# Patient Record
Sex: Male | Born: 1950
Health system: Southern US, Community
[De-identification: ages and names within clinical notes are randomized; demographics above are authoritative.]

## PROBLEM LIST (undated history)

## (undated) DIAGNOSIS — I480 Paroxysmal atrial fibrillation: Secondary | ICD-10-CM

## (undated) DIAGNOSIS — R0681 Apnea, not elsewhere classified: Secondary | ICD-10-CM

## (undated) DIAGNOSIS — G709 Myoneural disorder, unspecified: Secondary | ICD-10-CM

## (undated) DIAGNOSIS — I471 Supraventricular tachycardia, unspecified: Secondary | ICD-10-CM

## (undated) DIAGNOSIS — I10 Essential (primary) hypertension: Secondary | ICD-10-CM

## (undated) DIAGNOSIS — M1990 Unspecified osteoarthritis, unspecified site: Secondary | ICD-10-CM

## (undated) DIAGNOSIS — E119 Type 2 diabetes mellitus without complications: Secondary | ICD-10-CM

## (undated) DIAGNOSIS — E7800 Pure hypercholesterolemia, unspecified: Secondary | ICD-10-CM

## (undated) HISTORY — DX: Apnea, not elsewhere classified: R06.81

## (undated) HISTORY — DX: Myoneural disorder, unspecified: G70.9

## (undated) HISTORY — DX: Supraventricular tachycardia: I47.1

## (undated) HISTORY — DX: Unspecified osteoarthritis, unspecified site: M19.90

## (undated) HISTORY — DX: Supraventricular tachycardia, unspecified: I47.10

## (undated) HISTORY — PX: EYE SURGERY: SHX253

## (undated) HISTORY — DX: Paroxysmal atrial fibrillation: I48.0

---
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
## 2000-06-24 ENCOUNTER — Emergency Department (HOSPITAL_COMMUNITY): Admission: EM | Admit: 2000-06-24 | Discharge: 2000-06-24 | Payer: Self-pay | Admitting: Emergency Medicine

## 2000-06-24 ENCOUNTER — Encounter: Payer: Self-pay | Admitting: Internal Medicine

## 2000-07-06 ENCOUNTER — Emergency Department (HOSPITAL_COMMUNITY): Admission: EM | Admit: 2000-07-06 | Discharge: 2000-07-06 | Payer: Self-pay | Admitting: Internal Medicine

## 2004-08-11 ENCOUNTER — Encounter: Admission: RE | Admit: 2004-08-11 | Discharge: 2004-11-09 | Payer: Self-pay | Admitting: Family Medicine

## 2005-04-07 ENCOUNTER — Ambulatory Visit (HOSPITAL_BASED_OUTPATIENT_CLINIC_OR_DEPARTMENT_OTHER): Admission: RE | Admit: 2005-04-07 | Discharge: 2005-04-07 | Payer: Self-pay | Admitting: *Deleted

## 2005-04-19 ENCOUNTER — Ambulatory Visit: Payer: Self-pay | Admitting: Internal Medicine

## 2005-05-29 ENCOUNTER — Encounter: Admission: RE | Admit: 2005-05-29 | Discharge: 2005-05-29 | Payer: Self-pay | Admitting: Family Medicine

## 2008-08-24 HISTORY — PX: CARPAL TUNNEL RELEASE: SHX101

## 2012-07-12 ENCOUNTER — Other Ambulatory Visit: Payer: Self-pay | Admitting: Family Medicine

## 2012-07-12 DIAGNOSIS — M545 Low back pain, unspecified: Secondary | ICD-10-CM

## 2012-07-19 ENCOUNTER — Other Ambulatory Visit: Payer: Self-pay | Admitting: Family Medicine

## 2012-07-19 DIAGNOSIS — Z139 Encounter for screening, unspecified: Secondary | ICD-10-CM

## 2012-07-22 ENCOUNTER — Emergency Department (INDEPENDENT_AMBULATORY_CARE_PROVIDER_SITE_OTHER)
Admission: EM | Admit: 2012-07-22 | Discharge: 2012-07-22 | Disposition: A | Discharge status: Nursing Home (Includes ICF) | Payer: BC Managed Care – PPO | Source: Home / Self Care | Attending: Emergency Medicine | Admitting: Emergency Medicine

## 2012-07-22 ENCOUNTER — Emergency Department (HOSPITAL_COMMUNITY)
Admission: EM | Admit: 2012-07-22 | Discharge: 2012-07-22 | Disposition: A | Discharge status: Home / Self Care | Payer: BC Managed Care – PPO | Attending: Emergency Medicine | Admitting: Emergency Medicine

## 2012-07-22 ENCOUNTER — Encounter (HOSPITAL_COMMUNITY): Payer: Self-pay | Admitting: Emergency Medicine

## 2012-07-22 ENCOUNTER — Ambulatory Visit
Admission: RE | Admit: 2012-07-22 | Discharge: 2012-07-22 | Disposition: A | Discharge status: Home / Self Care | Payer: BC Managed Care – PPO | Source: Ambulatory Visit | Attending: Family Medicine | Admitting: Family Medicine

## 2012-07-22 ENCOUNTER — Encounter (HOSPITAL_COMMUNITY): Payer: Self-pay | Admitting: *Deleted

## 2012-07-22 ENCOUNTER — Emergency Department (HOSPITAL_COMMUNITY): Payer: BC Managed Care – PPO

## 2012-07-22 DIAGNOSIS — E7800 Pure hypercholesterolemia, unspecified: Secondary | ICD-10-CM | POA: Insufficient documentation

## 2012-07-22 DIAGNOSIS — I10 Essential (primary) hypertension: Secondary | ICD-10-CM | POA: Insufficient documentation

## 2012-07-22 DIAGNOSIS — R0789 Other chest pain: Secondary | ICD-10-CM

## 2012-07-22 DIAGNOSIS — R079 Chest pain, unspecified: Secondary | ICD-10-CM

## 2012-07-22 DIAGNOSIS — Z139 Encounter for screening, unspecified: Secondary | ICD-10-CM

## 2012-07-22 DIAGNOSIS — E119 Type 2 diabetes mellitus without complications: Secondary | ICD-10-CM | POA: Insufficient documentation

## 2012-07-22 DIAGNOSIS — Z79899 Other long term (current) drug therapy: Secondary | ICD-10-CM | POA: Insufficient documentation

## 2012-07-22 DIAGNOSIS — M545 Low back pain, unspecified: Secondary | ICD-10-CM

## 2012-07-22 DIAGNOSIS — Z794 Long term (current) use of insulin: Secondary | ICD-10-CM | POA: Insufficient documentation

## 2012-07-22 HISTORY — DX: Essential (primary) hypertension: I10

## 2012-07-22 HISTORY — DX: Pure hypercholesterolemia, unspecified: E78.00

## 2012-07-22 HISTORY — DX: Type 2 diabetes mellitus without complications: E11.9

## 2012-07-22 LAB — CBC WITH DIFFERENTIAL/PLATELET
Basophils Absolute: 0.1 10*3/uL (ref 0.0–0.1)
Eosinophils Relative: 3 % (ref 0–5)
HCT: 41 % (ref 39.0–52.0)
Lymphocytes Relative: 20 % (ref 12–46)
Lymphs Abs: 1.9 10*3/uL (ref 0.7–4.0)
MCV: 87.8 fL (ref 78.0–100.0)
Monocytes Absolute: 1 10*3/uL (ref 0.1–1.0)
Monocytes Relative: 10 % (ref 3–12)
RDW: 13.2 % (ref 11.5–15.5)
WBC: 9.4 10*3/uL (ref 4.0–10.5)

## 2012-07-22 LAB — COMPREHENSIVE METABOLIC PANEL
BUN: 16 mg/dL (ref 6–23)
CO2: 27 mEq/L (ref 19–32)
Calcium: 9.6 mg/dL (ref 8.4–10.5)
Creatinine, Ser: 0.86 mg/dL (ref 0.50–1.35)
GFR calc Af Amer: >90 mL/min (ref >90)
GFR calc non Af Amer: >90 mL/min (ref >90)
Glucose, Bld: 146 mg/dL — ABNORMAL HIGH (ref 70–99)

## 2012-07-22 LAB — TROPONIN I: Troponin I: <0.3 ng/mL (ref <0.30)

## 2012-07-22 MED ORDER — KETOROLAC TROMETHAMINE 30 MG/ML IJ SOLN
30.0000 mg | Freq: Once | INTRAMUSCULAR | Status: AC
  Administered 2012-07-22: 30 mg via INTRAVENOUS
  Filled 2012-07-22: qty 1

## 2012-07-22 NOTE — ED Notes (Signed)
Pt  Reports       l  Sided     Chest  Pain       X   1  Week     Constant   Pain          With  The      Degree   Varying  Actually  Worse  yest         Now  It is  A  4          Pt  Reports      Pain  Worse  l  Side   When  He  Takes  Deep  Breath

## 2012-07-22 NOTE — ED Notes (Signed)
Pt states yesterday began having left axilla pain while outside working, radiating to left neck and shoulder. Relieved with rest. Pt continues to c/o dull pain in left axilla area. 20g left hand, 2L [place], CTA. Pt rates pain 3/10. Reproducible with position or movement.

## 2012-07-22 NOTE — ED Provider Notes (Signed)
History     CSN: [id number]  Arrival date & time 07/22/12  1254   First MD Initiated Contact with Patient 07/22/12 1301      Chief Complaint  Patient presents with  . Chest Pain    (Consider location/radiation/quality/duration/timing/severity/associated sxs/prior treatment) Patient is a 61 y.o. male presenting with chest pain. The history is provided by the patient and the spouse.  Chest Pain The chest pain began 5 - 7 days ago. Chest pain occurs intermittently. The chest pain is unchanged. The pain is associated with breathing. The severity of the pain is mild. The quality of the pain is described as dull. The pain radiates to the left neck. Chest pain is worsened by deep breathing. Pertinent negatives for primary symptoms include no fever, no fatigue, no cough, no nausea and no vomiting. Risk factors include male gender and obesity.  His past medical history is significant for diabetes, hyperlipidemia and hypertension.     Past Medical History  Diagnosis Date  . Diabetes mellitus without complication   . Hypertension   . Hypercholesterolemia     Past Surgical History  Procedure Date  . Eye surgery     No family history on file.  History  Substance Use Topics  . Smoking status: Never Smoker   . Smokeless tobacco: Not on file  . Alcohol Use: No      Review of Systems  Constitutional: Negative for fever and fatigue.  Respiratory: Negative for cough.   Cardiovascular: Positive for chest pain.  Gastrointestinal: Negative for nausea and vomiting.    Allergies  Review of patient's allergies indicates no known allergies.  Home Medications   Current Outpatient Rx  Name  Route  Sig  Dispense  Refill  . INSULIN GLARGINE 100 UNIT/ML [place] SOLN   Subcutaneous   Inject 50 Units into the skin 2 (two) times daily.          . ASPIRIN EC 81 MG PO TBEC   Oral   Take 162 mg by mouth at bedtime.         Marland Kitchen EZETIMIBE 10 MG PO TABS   Oral   Take 10 mg by mouth  daily.         . INSULIN LISPRO (HUMAN) 100 UNIT/ML [place] SOLN   Subcutaneous   Inject 20-50 Units into the skin 3 (three) times daily before meals. Take as directed per sliding scale         . LISINOPRIL 20 MG PO TABS   Oral   Take 20 mg by mouth daily.         . MELOXICAM 7.5 MG PO TABS   Oral   Take 7.5 mg by mouth daily as needed. For pain/inflammation         . METFORMIN HCL 500 MG PO TABS   Oral   Take 1,000 mg by mouth 2 (two) times daily with a meal.         . OMEGA-3-ACID ETHYL ESTERS 1 G PO CAPS   Oral   Take 1 g by mouth 2 (two) times daily.         Marland Kitchen PREGABALIN 75 MG PO CAPS   Oral   Take 75 mg by mouth 2 (two) times daily.         Marland Kitchen ROSUVASTATIN CALCIUM 40 MG PO TABS   Oral   Take 20 mg by mouth at bedtime.         Marland Kitchen SITAGLIPTIN PHOSPHATE 100 MG PO TABS  Oral   Take 100 mg by mouth daily.           BP 169/73  Pulse 76  Temp 98.8 F (37.1 C) (Oral)  Resp 18  SpO2 97%  Physical Exam  Nursing note and vitals reviewed. Constitutional: He is oriented to person, place, and time. He appears well-developed and well-nourished.  Neck: Normal range of motion. Neck supple.  Cardiovascular: Normal rate, regular rhythm, normal heart sounds and intact distal pulses.   Pulmonary/Chest: Effort normal and breath sounds normal. He exhibits no tenderness.  Abdominal: Soft. Bowel sounds are normal. There is no tenderness.  Neurological: He is alert and oriented to person, place, and time.  Skin: Skin is warm and dry.    ED Course  Procedures (including critical care time)  Labs Reviewed - No data to display No results found.   1. Atypical chest pain       MDM     Linna Hoff, MD 07/26/12 1745

## 2012-07-22 NOTE — ED Notes (Signed)
Pt placed on 2L O2 ; heart monitor; rate 73 bpm

## 2012-07-22 NOTE — ED Provider Notes (Signed)
History     CSN: [id number]  Arrival date & time 07/22/12  1408   First MD Initiated Contact with Patient 07/22/12 1415      Chief Complaint  Patient presents with  . Chest Pain    (Consider location/radiation/quality/duration/timing/severity/associated sxs/prior treatment) HPI Comments: Patient with pmh DM, HTN, Cholesterol.  Presents with complaints of pain in the left side of his chest under his axilla for the past 7-8 days.  He says the symptoms started after cleaning up the yard.  He denies injury or trauma.  There is no exertional component but it is worse with deep breath and cough.  He has no prior cardiac history.  Patient is a 61 y.o. male presenting with chest pain. The history is provided by the patient.  Chest Pain Episode onset: one week ago. Chest pain occurs intermittently. The chest pain is unchanged. The pain is associated with breathing. The severity of the pain is moderate. The quality of the pain is described as sharp. The pain does not radiate. Chest pain is worsened by deep breathing and certain positions. Pertinent negatives for primary symptoms include no fever and no shortness of breath.     Past Medical History  Diagnosis Date  . Diabetes mellitus without complication   . Hypertension   . Hypercholesterolemia     Past Surgical History  Procedure Date  . Eye surgery     No family history on file.  History  Substance Use Topics  . Smoking status: Never Smoker   . Smokeless tobacco: Not on file  . Alcohol Use: No      Review of Systems  Constitutional: Negative for fever.  Respiratory: Negative for shortness of breath.   Cardiovascular: Positive for chest pain.  All other systems reviewed and are negative.    Allergies  Review of patient's allergies indicates no known allergies.  Home Medications   Current Outpatient Rx  Name  Route  Sig  Dispense  Refill  . INSULIN GLARGINE 100 UNIT/ML [place] SOLN   Subcutaneous   Inject into the  skin at bedtime.         Marland Kitchen HUMALOG [place]   Subcutaneous   Inject into the skin.         Marland Kitchen LISINOPRIL PO   Oral   Take by mouth.         Marland Kitchen LYRICA PO   Oral   Take by mouth.         . ROSUVASTATIN CALCIUM 20 MG PO TABS   Oral   Take 20 mg by mouth daily.         Marland Kitchen JANUVIA PO   Oral   Take by mouth.           There were no vitals taken for this visit.  Physical Exam  Nursing note and vitals reviewed. Constitutional: He is oriented to person, place, and time. He appears well-developed and well-nourished. No distress.  HENT:  Head: Normocephalic and atraumatic.  Mouth/Throat: Oropharynx is clear and moist.  Neck: Normal range of motion. Neck supple.  Cardiovascular: Normal rate and regular rhythm.   No murmur heard. Pulmonary/Chest: Effort normal and breath sounds normal. No respiratory distress. He has no wheezes.  Abdominal: Soft. Bowel sounds are normal. He exhibits no distension. There is no tenderness.  Musculoskeletal: Normal range of motion. He exhibits no edema.  Neurological: He is alert and oriented to person, place, and time.  Skin: Skin is warm and dry. He is not diaphoretic.  ED Course  Procedures (including critical care time)   Labs Reviewed  CBC WITH DIFFERENTIAL  COMPREHENSIVE METABOLIC PANEL  TROPONIN I   Dg Eye Foreign Body  07/22/2012  *RADIOLOGY REPORT*  Clinical Data: Foreign body screening for MRI.  ORBITS FOR FOREIGN BODY - 2 VIEW  Comparison: None.  Findings: No radiopaque orbital foreign bodies.  Skull appears within normal limits.  IMPRESSION: Negative.   Original Report Authenticated By: Andreas Newport, M.D.    Mr Lumbar Spine Wo Contrast  07/22/2012  *RADIOLOGY REPORT*  Clinical Data: Low back pain with right buttock pain.  Right leg pain and numbness.  MRI LUMBAR SPINE WITHOUT CONTRAST  Technique:  Multiplanar and multiecho pulse sequences of the lumbar spine were obtained without intravenous contrast.  Comparison: None.   Findings: Normal lumbar alignment.  Negative for fracture or mass. Conus medullaris is normal and terminates at mid L2.  L1-2:  Mild disc and facet degeneration.  L2-3:  Mild disc bulging and mild facet degeneration causing mild spinal stenosis.  No focal disc protrusion.  L3-4:  Mild disc bulging and mild facet degeneration without significant spinal stenosis.  L4-5:  Disc degeneration with disc bulging and mild spondylosis. Small left foraminal disc protrusion is present extending lateral to the foramen.  This could cause irritation of the left L4 or L5 nerve roots.  There is mild foraminal narrowing bilaterally due to spurring.  L5-S1:  Disc degeneration and spondylosis.  Schmorl's node in the inferior plate of L5 on the left.  There is marked facet hypertrophy on the left causing left foraminal encroachment.  There is also narrowing of the lateral recess.  Possible impingement of the left L5 or S1 nerve roots.  No acute disc protrusion.  IMPRESSION: Lumbar degenerative changes, most prominent on the left at L4-5 and L5-S1.   Original Report Authenticated By: Janeece Riggers, M.D.      No diagnosis found.   Date: 07/22/2012  Rate: 73  Rhythm: normal sinus rhythm  QRS Axis: normal  Intervals: normal  ST/T Wave abnormalities: normal  Conduction Disutrbances:none  Narrative Interpretation:   Old EKG Reviewed: unchanged    MDM  The patient presents here with chest pain that is atypical for cardiac pain.  It has been going on for about 8 days and the workup is negative.  I highly doubt a cardiac etiology.  He does have significant risk factors, but had a negative stress test about 1 1/2 years ago.  I have discussed the results with the patient who would prefer to be discharged.  I am okay with this as he has an appointment with his cardiologist on Monday.  I have advised him not to partake in any strenuous activity until seen by his pcp.  To return if he worsens.       Geoffery Lyons, MD 07/22/12  [phone]

## 2012-07-22 NOTE — ED Notes (Signed)
Placed  On  Cardiac  Monitor  Nasal  o2    At  2  l   Ns  tko  Via  20  Angio   l  Hand   1  Att

## 2012-09-15 ENCOUNTER — Telehealth: Payer: Self-pay | Admitting: Gastroenterology

## 2012-09-16 ENCOUNTER — Encounter: Payer: Self-pay | Admitting: Gastroenterology

## 2012-09-16 NOTE — Telephone Encounter (Signed)
Med Rec was able to find the Path and Polyp was Hyperplastic so his recall would be 10 years. lmom for pt or wife to call back.

## 2012-09-16 NOTE — Telephone Encounter (Signed)
Informed wife that I can only find the COLON from 08/03/2003 in [place] and I can't find the Path report. A Recall is in for a COLON in December, 2014. The old COLON stated COLONs should be repeated Q3 years. I have sent for the chart. Wife stated understanding.

## 2012-09-19 NOTE — Telephone Encounter (Signed)
Informed wife that pt is due in December, 2014 for his COLON and about his path report. Wife reports pt received a letter from his insurance company informing him he was due, so we will schedule him for March, 2014.

## 2012-11-07 ENCOUNTER — Encounter: Payer: BC Managed Care – PPO | Admitting: Gastroenterology

## 2012-11-17 ENCOUNTER — Telehealth: Payer: Self-pay | Admitting: Family Medicine

## 2012-11-17 DIAGNOSIS — E119 Type 2 diabetes mellitus without complications: Secondary | ICD-10-CM

## 2012-11-17 DIAGNOSIS — E785 Hyperlipidemia, unspecified: Secondary | ICD-10-CM

## 2012-11-17 MED ORDER — ROSUVASTATIN CALCIUM 40 MG PO TABS: 20.0000 mg | ORAL_TABLET | Freq: Every day | ORAL | Status: DC

## 2012-11-17 MED ORDER — INSULIN GLARGINE 100 UNIT/ML ~~LOC~~ SOLN: 50.0000 [IU] | Freq: Two times a day (BID) | SUBCUTANEOUS | Status: DC

## 2012-11-17 MED ORDER — EZETIMIBE 10 MG PO TABS: 10.0000 mg | ORAL_TABLET | Freq: Every day | ORAL | Status: DC

## 2012-11-17 MED ORDER — OMEGA-3-ACID ETHYL ESTERS 1 G PO CAPS: 2.0000 g | ORAL_CAPSULE | Freq: Two times a day (BID) | ORAL | Status: DC

## 2012-11-17 MED ORDER — SITAGLIPTIN PHOSPHATE 100 MG PO TABS: 100.0000 mg | ORAL_TABLET | Freq: Every day | ORAL | Status: DC

## 2012-11-17 NOTE — Telephone Encounter (Signed)
Medication refilled per protocol. 

## 2012-11-21 ENCOUNTER — Encounter: Payer: Self-pay | Admitting: Family Medicine

## 2012-11-25 ENCOUNTER — Encounter: Payer: Self-pay | Admitting: *Deleted

## 2012-11-25 ENCOUNTER — Ambulatory Visit (AMBULATORY_SURGERY_CENTER): Payer: BC Managed Care – PPO | Admitting: *Deleted

## 2012-11-25 VITALS — Ht 73.0 in | Wt 300.0 lb

## 2012-11-25 DIAGNOSIS — Z1211 Encounter for screening for malignant neoplasm of colon: Secondary | ICD-10-CM

## 2012-11-25 MED ORDER — MOVIPREP 100 G PO SOLR: ORAL | Status: DC

## 2012-12-02 ENCOUNTER — Encounter: Payer: Self-pay | Admitting: Gastroenterology

## 2012-12-02 ENCOUNTER — Ambulatory Visit (AMBULATORY_SURGERY_CENTER): Payer: BC Managed Care – PPO | Admitting: Gastroenterology

## 2012-12-02 ENCOUNTER — Other Ambulatory Visit: Payer: Self-pay | Admitting: Gastroenterology

## 2012-12-02 VITALS — BP 151/91 | HR 56 | Temp 98.1°F | Resp 20 | Ht 72.0 in | Wt 300.0 lb

## 2012-12-02 DIAGNOSIS — D126 Benign neoplasm of colon, unspecified: Secondary | ICD-10-CM

## 2012-12-02 DIAGNOSIS — Z1211 Encounter for screening for malignant neoplasm of colon: Secondary | ICD-10-CM

## 2012-12-02 DIAGNOSIS — K5730 Diverticulosis of large intestine without perforation or abscess without bleeding: Secondary | ICD-10-CM

## 2012-12-02 LAB — GLUCOSE, CAPILLARY: Glucose-Capillary: 112 mg/dL — ABNORMAL HIGH (ref 70–99)

## 2012-12-02 MED ORDER — SODIUM CHLORIDE 0.9 % IV SOLN: 500.0000 mL | INTRAVENOUS | Status: DC

## 2012-12-02 NOTE — Patient Instructions (Addendum)

## 2012-12-02 NOTE — Progress Notes (Signed)
Report to pacu rn, vss, bbs=clear 

## 2012-12-02 NOTE — Progress Notes (Signed)
Called to room to assist during endoscopic procedure.  Patient ID and intended procedure confirmed with present staff. Received instructions for my participation in the procedure from the performing physician.  

## 2012-12-02 NOTE — Op Note (Signed)
[facility] [address] [place], [address]   COLONOSCOPY PROCEDURE REPORT  PATIENT: [name], [name]  MR#: [id number] BIRTHDATE: 11-19-1950 , 61  yrs. old GENDER: Male ENDOSCOPIST: Mardella Layman, MD, [facility] REFERRED BY:  Lynnea Ferrier, M.D. PROCEDURE DATE:  12/02/2012 PROCEDURE:   Colonoscopy with snare polypectomy ASA CLASS:   Class III INDICATIONS:Average risk patient for colon cancer. MEDICATIONS: propofol (Diprivan) 150mg  IV  DESCRIPTION OF PROCEDURE:   After the risks and benefits and of the procedure were explained, informed consent was obtained.  A digital rectal exam revealed no abnormalities of the rectum.    The LB [id number]  endoscope was introduced through the anus and advanced to the cecum, which was identified by both the appendix and ileocecal valve .  The quality of the prep was excellent, using MoviPrep .  The instrument was then slowly withdrawn as the colon was fully examined.     COLON FINDINGS: Mild diverticulosis was noted in the descending colon and sigmoid colon.   A smooth sessile polyp ranging between 3-31mm in size was found in the sigmoid colon.  A polypectomy was performed with a cold snare.  The resection was complete and the polyp tissue was completely retrieved.     Retroflexed views revealed no abnormalities.     The scope was then withdrawn from the patient and the procedure completed.  COMPLICATIONS: There were no complications. ENDOSCOPIC IMPRESSION: 1.   Mild diverticulosis was noted in the descending colon and sigmoid colon 2.   Sessile polyp ranging between 3-59mm in size was found in the sigmoid colon; polypectomy was performed with a cold snare ,,,R/O adenoma...  RECOMMENDATIONS: 1.  Repeat colonoscopy in 5 years if polyp adenomatous; otherwise 10 years 2.  High fiber diet   REPEAT EXAM:  cc:  _______________________________ eSignedMardella Layman, MD, [facility] 12/02/2012 1:23  PM     PATIENT NAME:  [name], [name] MR#: [id number]

## 2012-12-05 ENCOUNTER — Telehealth: Payer: Self-pay | Admitting: *Deleted

## 2012-12-05 NOTE — Telephone Encounter (Signed)
No answer, left message to call if questions or concerns. 

## 2012-12-07 ENCOUNTER — Encounter: Payer: Self-pay | Admitting: Gastroenterology

## 2012-12-20 ENCOUNTER — Ambulatory Visit: Payer: Self-pay | Admitting: Family Medicine

## 2012-12-22 ENCOUNTER — Ambulatory Visit (INDEPENDENT_AMBULATORY_CARE_PROVIDER_SITE_OTHER): Payer: BC Managed Care – PPO | Admitting: Family Medicine

## 2012-12-22 ENCOUNTER — Encounter: Payer: Self-pay | Admitting: Family Medicine

## 2012-12-22 VITALS — BP 118/68 | Temp 98.0°F | Wt 299.0 lb

## 2012-12-22 DIAGNOSIS — I10 Essential (primary) hypertension: Secondary | ICD-10-CM | POA: Insufficient documentation

## 2012-12-22 DIAGNOSIS — R0681 Apnea, not elsewhere classified: Secondary | ICD-10-CM | POA: Insufficient documentation

## 2012-12-22 DIAGNOSIS — I471 Supraventricular tachycardia: Secondary | ICD-10-CM

## 2012-12-22 DIAGNOSIS — E7800 Pure hypercholesterolemia, unspecified: Secondary | ICD-10-CM | POA: Insufficient documentation

## 2012-12-22 DIAGNOSIS — E119 Type 2 diabetes mellitus without complications: Secondary | ICD-10-CM | POA: Insufficient documentation

## 2012-12-22 MED ORDER — METOPROLOL SUCCINATE ER 25 MG PO TB24: 25.0000 mg | ORAL_TABLET | Freq: Every day | ORAL | Status: DC

## 2012-12-22 MED ORDER — INSULIN GLARGINE 100 UNIT/ML ~~LOC~~ SOLN: 50.0000 [IU] | Freq: Two times a day (BID) | SUBCUTANEOUS | Status: DC

## 2012-12-22 NOTE — Progress Notes (Signed)
Subjective:    Patient ID: Ronald Dorsey, male    DOB: [birth year], 62 y.o.   MRN: [id number]  HPI Had episode of SVT.  Was started on Toprol XL 25 mg poqday.  Has had no further episodes.  Holter monitor was normal, echo normal, stress test with Dr. Jacinto Halim was normal.  Sleep study revealed AHI 1.2.  Tsh was normal.  Patient denies presyncope, syncope, chest pain.  Past Medical History  Diagnosis Date  . Arthritis   . Hypertension   . Hypercholesterolemia   . Diabetes mellitus without complication   . Apnea   . PSVT (paroxysmal supraventricular tachycardia)    Current Outpatient Prescriptions on File Prior to Visit  Medication Sig Dispense Refill  . aspirin EC 81 MG tablet Take 162 mg by mouth at bedtime.      Marland Kitchen BAYER CONTOUR NEXT TEST test strip       . ezetimibe (ZETIA) 10 MG tablet Take 1 tablet (10 mg total) by mouth daily.  30 tablet  1  . insulin lispro (HUMALOG) 100 UNIT/ML injection Inject 20-50 Units into the skin 3 (three) times daily before meals. Take as directed per sliding scale      . lisinopril (PRINIVIL,ZESTRIL) 20 MG tablet Take 20 mg by mouth daily.      . meloxicam (MOBIC) 7.5 MG tablet Take 7.5 mg by mouth daily as needed. For pain/inflammation      . metFORMIN (GLUCOPHAGE) 500 MG tablet Take 1,000 mg by mouth 2 (two) times daily with a meal.      . omega-3 acid ethyl esters (LOVAZA) 1 G capsule Take 2 capsules (2 g total) by mouth 2 (two) times daily.  120 capsule  1  . rosuvastatin (CRESTOR) 40 MG tablet Take 0.5 tablets (20 mg total) by mouth at bedtime.  30 tablet  1  . sitaGLIPtin (JANUVIA) 100 MG tablet Take 1 tablet (100 mg total) by mouth daily.  30 tablet  1   No current facility-administered medications on file prior to visit.   No Known Allergies History   Social History  . Marital Status: Married    Spouse Name: N/A    Number of Children: N/A  . Years of Education: N/A   Occupational History  . Not on file.   Social History Main Topics  .  Smoking status: Former Games developer  . Smokeless tobacco: Former Neurosurgeon  . Alcohol Use: 0.0 oz/week     Comment: 1 glass of wine a month or less  . Drug Use: No  . Sexually Active: Not on file   Other Topics Concern  . Not on file   Social History Narrative  . No narrative on file      Review of Systems  All other systems reviewed and are negative.       Objective:   Physical Exam  Constitutional: He appears well-developed and well-nourished.  Cardiovascular: Normal rate, regular rhythm, normal heart sounds and intact distal pulses.  Exam reveals no gallop.   No murmur heard. Pulmonary/Chest: Effort normal and breath sounds normal. No respiratory distress. He has no wheezes. He has no rales.  Abdominal: Soft. Bowel sounds are normal. He exhibits no distension. There is no tenderness.          Assessment & Plan:  DM (diabetes mellitus) - Plan: insulin glargine (LANTUS) 100 UNIT/ML injection  Paroxysmal SVT (supraventricular tachycardia)  At this point, there are no identifiable reversible causes.  Continue Toprol XL 25 poqday.   There  is no indication for ablation.  I discussed vagal maneuvers at length with patient and when to seek immediate medical attention.  Follow up in July for diabtetes check up.

## 2012-12-27 ENCOUNTER — Other Ambulatory Visit: Payer: Self-pay | Admitting: Family Medicine

## 2012-12-27 MED ORDER — LISINOPRIL 20 MG PO TABS: 40.0000 mg | ORAL_TABLET | Freq: Every day | ORAL | Status: DC

## 2012-12-27 NOTE — Telephone Encounter (Signed)
Pt asking for refill lisinopril 20mg  BID.  Chart says Lisinopril 20mg  QD.  Please clarify?

## 2012-12-27 NOTE — Telephone Encounter (Signed)
Supposed to be 2 tabs once a day

## 2013-01-14 ENCOUNTER — Telehealth: Payer: Self-pay | Admitting: Family Medicine

## 2013-01-14 DIAGNOSIS — E119 Type 2 diabetes mellitus without complications: Secondary | ICD-10-CM

## 2013-01-14 DIAGNOSIS — E785 Hyperlipidemia, unspecified: Secondary | ICD-10-CM

## 2013-01-14 MED ORDER — EZETIMIBE 10 MG PO TABS: 10.0000 mg | ORAL_TABLET | Freq: Every day | ORAL | Status: DC

## 2013-01-14 MED ORDER — SITAGLIPTIN PHOSPHATE 100 MG PO TABS: 100.0000 mg | ORAL_TABLET | Freq: Every day | ORAL | Status: DC

## 2013-01-14 NOTE — Telephone Encounter (Signed)
Med refilled.

## 2013-01-16 ENCOUNTER — Telehealth: Payer: Self-pay | Admitting: Family Medicine

## 2013-01-16 DIAGNOSIS — E119 Type 2 diabetes mellitus without complications: Secondary | ICD-10-CM

## 2013-01-17 MED ORDER — OMEGA-3-ACID ETHYL ESTERS 1 G PO CAPS: 2.0000 g | ORAL_CAPSULE | Freq: Two times a day (BID) | ORAL | Status: DC

## 2013-01-17 NOTE — Telephone Encounter (Signed)
Rx Refilled  

## 2013-02-21 ENCOUNTER — Telehealth: Payer: Self-pay | Admitting: Family Medicine

## 2013-02-21 DIAGNOSIS — E785 Hyperlipidemia, unspecified: Secondary | ICD-10-CM

## 2013-02-21 MED ORDER — ROSUVASTATIN CALCIUM 40 MG PO TABS: 20.0000 mg | ORAL_TABLET | Freq: Every day | ORAL | Status: DC

## 2013-02-21 NOTE — Telephone Encounter (Signed)
Rx Refilled  

## 2013-02-22 ENCOUNTER — Telehealth: Payer: Self-pay | Admitting: Family Medicine

## 2013-02-22 MED ORDER — PREGABALIN 150 MG PO CAPS: 150.0000 mg | ORAL_CAPSULE | Freq: Two times a day (BID) | ORAL | Status: DC

## 2013-02-22 NOTE — Telephone Encounter (Signed)
Med called out 

## 2013-03-13 ENCOUNTER — Other Ambulatory Visit: Payer: Self-pay | Admitting: *Deleted

## 2013-03-13 DIAGNOSIS — E785 Hyperlipidemia, unspecified: Secondary | ICD-10-CM

## 2013-03-23 ENCOUNTER — Other Ambulatory Visit: Payer: Self-pay | Admitting: Family Medicine

## 2013-03-24 NOTE — Telephone Encounter (Signed)
Medication refilled per protocol. 

## 2013-04-24 ENCOUNTER — Other Ambulatory Visit: Payer: Self-pay | Admitting: Family Medicine

## 2013-04-25 NOTE — Telephone Encounter (Signed)
Meds refilled.

## 2013-05-25 ENCOUNTER — Other Ambulatory Visit: Payer: Self-pay | Admitting: Family Medicine

## 2013-05-29 ENCOUNTER — Other Ambulatory Visit: Payer: Self-pay | Admitting: Family Medicine

## 2013-05-30 NOTE — Telephone Encounter (Addendum)
Lyrica just refilled 10/2  #60  Pharmacy was called and this was verified.

## 2013-06-22 ENCOUNTER — Encounter: Payer: Self-pay | Admitting: Family Medicine

## 2013-06-26 ENCOUNTER — Other Ambulatory Visit: Payer: Self-pay | Admitting: Family Medicine

## 2013-06-29 ENCOUNTER — Other Ambulatory Visit: Payer: Self-pay

## 2013-07-04 ENCOUNTER — Other Ambulatory Visit: Payer: Self-pay | Admitting: Family Medicine

## 2013-07-05 MED ORDER — PREGABALIN 150 MG PO CAPS: ORAL_CAPSULE | ORAL | Status: DC

## 2013-07-05 NOTE — Telephone Encounter (Signed)
Pt has CPE schedules for December - Rx printed and faxed to pharmacy

## 2013-07-10 ENCOUNTER — Other Ambulatory Visit: Payer: Self-pay | Admitting: Family Medicine

## 2013-07-18 ENCOUNTER — Encounter: Payer: Self-pay | Admitting: Family Medicine

## 2013-08-10 ENCOUNTER — Ambulatory Visit (INDEPENDENT_AMBULATORY_CARE_PROVIDER_SITE_OTHER): Payer: BC Managed Care – PPO | Admitting: Family Medicine

## 2013-08-10 ENCOUNTER — Encounter: Payer: Self-pay | Admitting: Family Medicine

## 2013-08-10 VITALS — BP 120/70 | HR 68 | Temp 97.1°F | Resp 18 | Ht 73.0 in | Wt 303.0 lb

## 2013-08-10 DIAGNOSIS — E119 Type 2 diabetes mellitus without complications: Secondary | ICD-10-CM

## 2013-08-10 DIAGNOSIS — Z0000 Encounter for general adult medical examination without abnormal findings: Secondary | ICD-10-CM

## 2013-08-10 LAB — CBC WITH DIFFERENTIAL/PLATELET
Basophils Relative: 1 % (ref 0–1)
Eosinophils Absolute: 0.3 10*3/uL (ref 0.0–0.7)
HCT: 46.6 % (ref 39.0–52.0)
Hemoglobin: 15.8 g/dL (ref 13.0–17.0)
Lymphocytes Relative: 27 % (ref 12–46)
MCH: 29.8 pg (ref 26.0–34.0)
MCHC: 33.9 g/dL (ref 30.0–36.0)
Monocytes Absolute: 0.7 10*3/uL (ref 0.1–1.0)
Monocytes Relative: 10 % (ref 3–12)
Neutro Abs: 4 10*3/uL (ref 1.7–7.7)

## 2013-08-10 LAB — COMPLETE METABOLIC PANEL WITH GFR
Albumin: 4.6 g/dL (ref 3.5–5.2)
Alkaline Phosphatase: 55 U/L (ref 39–117)
BUN: 26 mg/dL — ABNORMAL HIGH (ref 6–23)
CO2: 26 mEq/L (ref 19–32)
Calcium: 10.1 mg/dL (ref 8.4–10.5)
GFR, Est African American: >89 mL/min
GFR, Est Non African American: >89 mL/min
Glucose, Bld: 175 mg/dL — ABNORMAL HIGH (ref 70–99)
Potassium: 4.7 mEq/L (ref 3.5–5.3)
Total Bilirubin: 0.6 mg/dL (ref 0.3–1.2)

## 2013-08-10 LAB — HEMOGLOBIN A1C: Mean Plasma Glucose: 166 mg/dL — ABNORMAL HIGH (ref <117)

## 2013-08-10 LAB — PSA: PSA: 0.75 ng/mL (ref <=4.00)

## 2013-08-10 LAB — LIPID PANEL
Cholesterol: 85 mg/dL (ref 0–200)
Triglycerides: 119 mg/dL (ref <150)

## 2013-08-10 NOTE — Progress Notes (Signed)
Subjective:    Patient ID: Ronald Dorsey, male    DOB: 1951/04/20, 62 y.o.   MRN: [id number]  HPI Patient is a very pleasant 62 year old white male who is here today for complete physical exam. He is due for his colonoscopy. His last colonoscopy was in 2010. He had a pneumonia vaccine in 2010. He is not due until next year. He has flu shot earlier this year. He had his tetanus shot 2008. He does request a Zostavax. He is due for a prostate exam.  He is concerned because his sugars have gradually risen over the last few months. His weight is stable. He denies any polyuria polydipsia or blurred vision. He denies any episodes of hypoglycemia. He also has a small lesion on the dorsum of his right index finger is consistent with a ganglion cyst. Past Medical History  Diagnosis Date  . Arthritis   . Hypertension   . Hypercholesterolemia   . Diabetes mellitus without complication   . Apnea   . PSVT (paroxysmal supraventricular tachycardia)   . Neuromuscular disorder     sciatica   Current Outpatient Prescriptions on File Prior to Visit  Medication Sig Dispense Refill  . aspirin EC 81 MG tablet Take 162 mg by mouth at bedtime.      Marland Kitchen BAYER CONTOUR NEXT TEST test strip       . insulin lispro (HUMALOG) 100 UNIT/ML injection Inject 20-50 Units into the skin 3 (three) times daily before meals. Take as directed per sliding scale      . INSULIN SYRINGE .5CC/29G 29G X 1/2" 0.5 ML MISC USE AS DIRECTED  100 each  5  . lisinopril (PRINIVIL,ZESTRIL) 20 MG tablet Take 2 tablets (40 mg total) by mouth daily.  180 tablet  3  . meloxicam (MOBIC) 7.5 MG tablet Take 7.5 mg by mouth daily as needed. For pain/inflammation      . metFORMIN (GLUCOPHAGE) 500 MG tablet Take 1,000 mg by mouth 2 (two) times daily with a meal.      . metoprolol succinate (TOPROL-XL) 25 MG 24 hr tablet Take 1 tablet (25 mg total) by mouth daily.  90 tablet  1  . omega-3 acid ethyl esters (LOVAZA) 1 G capsule Take 2 capsules (2 g total)  by mouth 2 (two) times daily.  120 capsule  11  . pregabalin (LYRICA) 150 MG capsule TAKE 1 CAPSULE BY MOUTH TWICE DAILY  60 capsule  3  . rosuvastatin (CRESTOR) 40 MG tablet Take 0.5 tablets (20 mg total) by mouth at bedtime.  30 tablet  1  . sitaGLIPtin (JANUVIA) 100 MG tablet Take 1 tablet (100 mg total) by mouth daily.  30 tablet  1  . ZETIA 10 MG tablet TAKE 1 TABLET BY MOUTH EVERY DAY  30 tablet  5   No current facility-administered medications on file prior to visit.   No Known Allergies Past Surgical History  Procedure Laterality Date  . Eye surgery    . Tonsillectomy  1961  . Carpal tunnel release  2010    left wrist   History   Social History  . Marital Status: Married    Spouse Name: N/A    Number of Children: N/A  . Years of Education: N/A   Occupational History  . Not on file.   Social History Main Topics  . Smoking status: Former Games developer  . Smokeless tobacco: Former Neurosurgeon  . Alcohol Use: 0.0 oz/week     Comment: 1 glass of wine a  month or less  . Drug Use: No  . 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 Not on file   Social History Narrative  . No narrative on file   No family history on file.    Review of Systems  All other systems reviewed and are negative.       Objective:   Physical Exam  Vitals reviewed. Constitutional: He is oriented to person, place, and time. He appears well-developed and well-nourished. No distress.  HENT:  Head: Normocephalic and atraumatic.  Right Ear: External ear normal.  Left Ear: External ear normal.  Nose: Nose normal.  Mouth/Throat: Oropharynx is clear and moist. No oropharyngeal exudate.  Eyes: Conjunctivae and EOM are normal. Pupils are equal, round, and reactive to light. Right eye exhibits no discharge. Left eye exhibits no discharge. No scleral icterus.  Neck: Normal range of motion. Neck supple. No JVD present. No thyromegaly present.  Cardiovascular:  Normal rate, regular rhythm and normal heart sounds.  Exam reveals no gallop and no friction rub.   No murmur heard. Pulmonary/Chest: Effort normal and breath sounds normal. No stridor. No respiratory distress. He has no wheezes. He has no rales. He exhibits no tenderness.  Abdominal: Soft. Bowel sounds are normal. He exhibits no distension and no mass. There is no tenderness. There is no rebound and no guarding.  Genitourinary: Rectum normal and prostate normal. No penile tenderness.  Musculoskeletal: Normal range of motion. He exhibits no edema and no tenderness.  Lymphadenopathy:    He has no cervical adenopathy.  Neurological: He is alert and oriented to person, place, and time. He has normal reflexes. He displays normal reflexes. No cranial nerve deficit. He exhibits normal muscle tone. Coordination normal.  Skin: Skin is warm. No rash noted. He is not diaphoretic. No erythema. No pallor.  Psychiatric: He has a normal mood and affect. His behavior is normal. Judgment and thought content normal.          Assessment & Plan:  1. DM (diabetes mellitus) Check hemoglobin A1c. If greater than 7.0, I would decrease the patient's insulin by approximately 10% and start the patient on Invokana 300 mg by mouth daily to try to help achieve some weight loss - Hemoglobin A1c  2. Routine general medical examination at a health care facility Patient's physical exam today is normal aside from his obesity. We discussed increasing aerobic exercise and portion control to try to achieve 20-30 pounds of weight loss. I will schedule the patient for colonoscopy as his last colonoscopy was in 2004. His rectal exam is normal today. I will await the results of his PSA. I will also check a fasting lipid panel. His goal LDL is less than 100 due to his diabetes. His blood pressures well controlled. His immunizations are up to date. He is due for Prevnar 13 and 65. He'll also be due for a booster on his Pneumovax after  65. - CBC with Differential - COMPLETE METABOLIC PANEL WITH GFR - Lipid panel - PSA - Ambulatory referral to Gastroenterology

## 2013-08-11 ENCOUNTER — Other Ambulatory Visit: Payer: Self-pay | Admitting: Family Medicine

## 2013-08-14 MED ORDER — INSULIN GLARGINE 100 UNIT/ML ~~LOC~~ SOLN: SUBCUTANEOUS | Status: DC

## 2013-08-14 NOTE — Telephone Encounter (Signed)
Prescription refilled.

## 2013-08-15 ENCOUNTER — Other Ambulatory Visit: Payer: Self-pay | Admitting: Family Medicine

## 2013-08-18 NOTE — Telephone Encounter (Signed)
Medication refilled per protocol. 

## 2013-08-22 ENCOUNTER — Telehealth: Payer: Self-pay | Admitting: Family Medicine

## 2013-08-22 NOTE — Telephone Encounter (Signed)
Error

## 2013-09-01 ENCOUNTER — Telehealth: Payer: Self-pay | Admitting: Family Medicine

## 2013-09-01 MED ORDER — CANAGLIFLOZIN 100 MG PO TABS: 100.0000 mg | ORAL_TABLET | Freq: Every day | ORAL | Status: DC

## 2013-09-01 NOTE — Telephone Encounter (Signed)
Pt aware of lab results. He had not returned nurse's phone call because he reviewed his own results on "MyChart"  Told pt doctor did want to make some changes.  Discussed Invokana, Rx to pharmacy.  Told patient about discount card.  Also discussed insulin changes per provider directions.  Reinforce need for three month follow up appt.

## 2013-09-01 NOTE — Telephone Encounter (Signed)
Message copied by Olena Mater on Fri Sep 01, 2013 11:55 AM ------      Message from: Jenna Luo      Created: Fri Aug 11, 2013  7:10 AM       hdl is still low, try to increase aerobic exercise as much as possible.  Also, HgA1cis 7.4.  Recommend starting invokana 100 mg poqday.  Decrease insulin by 10% to make sure no hypoglycemia and then increase back to present dose if sugars require it.  Recheck in three months.  He should swing by for the 1 year free copay card for the invokana. ------

## 2013-09-10 ENCOUNTER — Other Ambulatory Visit: Payer: Self-pay | Admitting: Family Medicine

## 2013-09-21 ENCOUNTER — Other Ambulatory Visit: Payer: Self-pay | Admitting: Family Medicine

## 2013-09-21 ENCOUNTER — Ambulatory Visit (INDEPENDENT_AMBULATORY_CARE_PROVIDER_SITE_OTHER): Payer: BC Managed Care – PPO | Admitting: Family Medicine

## 2013-09-21 ENCOUNTER — Encounter: Payer: Self-pay | Admitting: Family Medicine

## 2013-09-21 VITALS — BP 120/60 | HR 78 | Temp 97.8°F | Resp 18 | Ht 73.0 in | Wt 299.0 lb

## 2013-09-21 DIAGNOSIS — J069 Acute upper respiratory infection, unspecified: Secondary | ICD-10-CM

## 2013-09-21 DIAGNOSIS — E119 Type 2 diabetes mellitus without complications: Secondary | ICD-10-CM

## 2013-09-21 LAB — INFLUENZA A AND B
Inflenza A Ag: NEGATIVE
Influenza B Ag: NEGATIVE

## 2013-09-21 MED ORDER — SITAGLIPTIN PHOSPHATE 100 MG PO TABS: 100.0000 mg | ORAL_TABLET | Freq: Every day | ORAL | Status: DC

## 2013-09-21 NOTE — Progress Notes (Signed)
Subjective:    Patient ID: Ronald Dorsey, male    DOB: 1950-10-06, 63 y.o.   MRN: [id number]  HPI  Patient has had viral cold-like symptoms for one week.  These include body aches, chills, congestion, cough, and generalized malaise. He denies any fever. He denies any nausea vomiting or diarrhea. He denies any sore throat. He denies any antalgia or sinus pain. Past Medical History  Diagnosis Date  . Arthritis   . Hypertension   . Hypercholesterolemia   . Diabetes mellitus without complication   . Apnea   . PSVT (paroxysmal supraventricular tachycardia)   . Neuromuscular disorder     sciatica   Current Outpatient Prescriptions on File Prior to Visit  Medication Sig Dispense Refill  . aspirin EC 81 MG tablet Take 162 mg by mouth at bedtime.      Marland Kitchen BAYER CONTOUR NEXT TEST test strip TEST BLOODSUGAR FOUR TIMES DAILY  100 each  11  . Canagliflozin 100 MG TABS Take 1 tablet (100 mg total) by mouth daily.  30 tablet  2  . HUMALOG 100 UNIT/ML injection TAKE AS DIRECTED  30 mL  5  . insulin glargine (LANTUS) 100 UNIT/ML injection 50 units bid  10 mL  2  . INSULIN SYRINGE .5CC/29G 29G X 1/2" 0.5 ML MISC USE AS DIRECTED  100 each  5  . lisinopril (PRINIVIL,ZESTRIL) 20 MG tablet Take 2 tablets (40 mg total) by mouth daily.  180 tablet  3  . meloxicam (MOBIC) 7.5 MG tablet Take 7.5 mg by mouth daily as needed. For pain/inflammation      . metFORMIN (GLUCOPHAGE) 500 MG tablet TAKE 2 TABLETS BY MOUTH TWICE DAILY- STOP ACTOS  120 tablet  5  . metoprolol succinate (TOPROL-XL) 25 MG 24 hr tablet TAKE 1 TABLET BY MOUTH DAILY  90 tablet  1  . omega-3 acid ethyl esters (LOVAZA) 1 G capsule Take 2 capsules (2 g total) by mouth 2 (two) times daily.  120 capsule  11  . pregabalin (LYRICA) 150 MG capsule TAKE 1 CAPSULE BY MOUTH TWICE DAILY  60 capsule  3  . rosuvastatin (CRESTOR) 40 MG tablet Take 0.5 tablets (20 mg total) by mouth at bedtime.  30 tablet  1  . ZETIA 10 MG tablet TAKE 1 TABLET BY MOUTH  EVERY DAY  30 tablet  5   No current facility-administered medications on file prior to visit.   No Known Allergies History   Social History  . Marital Status: Married    Spouse Name: N/A    Number of Children: N/A  . Years of Education: N/A   Occupational History  . Not on file.   Social History Main Topics  . Smoking status: Former Research scientist (life sciences)  . Smokeless tobacco: Former Systems developer  . Alcohol Use: 0.0 oz/week     Comment: 1 glass of wine a month or less  . Drug Use: No  . 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 Not on file   Social History Narrative  . No narrative on file     Review of Systems  All other systems reviewed and are negative.       Objective:   Physical Exam  Vitals reviewed. Constitutional: He appears well-developed and well-nourished.  HENT:  Right Ear: Tympanic membrane, external ear and ear canal normal.  Left Ear: Tympanic membrane, external ear and ear canal normal.  Nose: Mucosal edema and rhinorrhea  present. Right sinus exhibits no maxillary sinus tenderness and no frontal sinus tenderness. Left sinus exhibits no maxillary sinus tenderness and no frontal sinus tenderness.  Mouth/Throat: Oropharynx is clear and moist.  Eyes: Conjunctivae are normal. Pupils are equal, round, and reactive to light. No scleral icterus.  Neck: Neck supple.  Cardiovascular: Normal rate, regular rhythm and normal heart sounds.   No murmur heard. Pulmonary/Chest: Effort normal and breath sounds normal. No respiratory distress. He has no wheezes. He has no rales. He exhibits no tenderness.  Abdominal: Soft. Bowel sounds are normal. He exhibits no distension. There is no tenderness. There is no rebound and no guarding.  Lymphadenopathy:    He has no cervical adenopathy.          Assessment & Plan:  1. DM (diabetes mellitus) - sitaGLIPtin (JANUVIA) 100 MG tablet; Take 1 tablet (100 mg total) by mouth daily.   Dispense: 90 tablet; Refill: 1  2. Viral upper respiratory illness Patient's influenza test is negative. I believe the patient has a cold. I recommended symptomatic therapy with Coricidin HBP for congestion, Mucinex DM for cough and congestion, and Tylenol for body aches and fever. I anticipate spontaneous resolution in 3-4 days. - Influenza a and b

## 2013-09-22 ENCOUNTER — Telehealth: Payer: Self-pay | Admitting: Family Medicine

## 2013-09-22 DIAGNOSIS — E1165 Type 2 diabetes mellitus with hyperglycemia: Principal | ICD-10-CM

## 2013-09-22 DIAGNOSIS — IMO0001 Reserved for inherently not codable concepts without codable children: Secondary | ICD-10-CM

## 2013-09-22 MED ORDER — INSULIN ASPART 100 UNIT/ML ~~LOC~~ SOLN: SUBCUTANEOUS | Status: DC

## 2013-09-22 NOTE — Telephone Encounter (Signed)
Called patient and left message that change made from Humalog to Novolog.  Exact same insulin, just a different manufacturer.  To use same dose he is using now with the Humalog.  Call us if has any questions

## 2013-09-22 NOTE — Telephone Encounter (Signed)
Insurance requires Prior Auth for Humalog insulin.  Novolog is covered by insurance.  Can we switch?

## 2013-09-22 NOTE — Telephone Encounter (Signed)
Yes, unit for unit

## 2013-10-05 ENCOUNTER — Other Ambulatory Visit: Payer: Self-pay | Admitting: Family Medicine

## 2013-10-06 NOTE — Telephone Encounter (Signed)
Refill appropriate and filled per protocol. 

## 2013-10-19 ENCOUNTER — Other Ambulatory Visit: Payer: Self-pay | Admitting: Family Medicine

## 2013-10-19 ENCOUNTER — Encounter: Payer: Self-pay | Admitting: Family Medicine

## 2013-10-20 ENCOUNTER — Other Ambulatory Visit: Payer: Self-pay | Admitting: Family Medicine

## 2013-10-20 DIAGNOSIS — E1165 Type 2 diabetes mellitus with hyperglycemia: Principal | ICD-10-CM

## 2013-10-20 DIAGNOSIS — IMO0001 Reserved for inherently not codable concepts without codable children: Secondary | ICD-10-CM

## 2013-10-20 MED ORDER — INSULIN GLARGINE 100 UNIT/ML ~~LOC~~ SOLN: SUBCUTANEOUS | Status: DC

## 2013-10-20 MED ORDER — INSULIN ASPART 100 UNIT/ML ~~LOC~~ SOLN: SUBCUTANEOUS | Status: DC

## 2013-10-20 MED ORDER — MELOXICAM 7.5 MG PO TABS: 7.5000 mg | ORAL_TABLET | Freq: Every day | ORAL | Status: DC | PRN

## 2013-11-16 ENCOUNTER — Ambulatory Visit: Payer: BC Managed Care – PPO | Admitting: Family Medicine

## 2013-11-21 ENCOUNTER — Other Ambulatory Visit: Payer: Self-pay | Admitting: Family Medicine

## 2013-11-22 NOTE — Telephone Encounter (Signed)
Medication called to pharmacy. 

## 2013-11-22 NOTE — Telephone Encounter (Signed)
Ok x 5 

## 2013-11-22 NOTE — Telephone Encounter (Signed)
Ok to refill??  Last office visit 09/21/2013.  Last refill 06/25/2013.

## 2013-12-14 ENCOUNTER — Encounter: Payer: Self-pay | Admitting: Family Medicine

## 2013-12-14 ENCOUNTER — Ambulatory Visit (INDEPENDENT_AMBULATORY_CARE_PROVIDER_SITE_OTHER): Payer: BC Managed Care – PPO | Admitting: Family Medicine

## 2013-12-14 VITALS — BP 130/64 | HR 60 | Temp 97.0°F | Resp 16 | Ht 73.0 in | Wt 294.0 lb

## 2013-12-14 DIAGNOSIS — I10 Essential (primary) hypertension: Secondary | ICD-10-CM

## 2013-12-14 DIAGNOSIS — E785 Hyperlipidemia, unspecified: Secondary | ICD-10-CM

## 2013-12-14 DIAGNOSIS — E119 Type 2 diabetes mellitus without complications: Secondary | ICD-10-CM

## 2013-12-14 LAB — COMPLETE METABOLIC PANEL WITH GFR
ALBUMIN: 4.6 g/dL (ref 3.5–5.2)
ALK PHOS: 53 U/L (ref 39–117)
ALT: 25 U/L (ref 0–53)
AST: 26 U/L (ref 0–37)
BILIRUBIN TOTAL: 0.5 mg/dL (ref 0.2–1.2)
BUN: 22 mg/dL (ref 6–23)
CO2: 26 mEq/L (ref 19–32)
Calcium: 9.5 mg/dL (ref 8.4–10.5)
Chloride: 104 mEq/L (ref 96–112)
Creat: 0.91 mg/dL (ref 0.50–1.35)
GFR, Est African American: >89 mL/min
GFR, Est Non African American: >89 mL/min
Glucose, Bld: 176 mg/dL — ABNORMAL HIGH (ref 70–99)
POTASSIUM: 4.5 meq/L (ref 3.5–5.3)
Sodium: 140 mEq/L (ref 135–145)
Total Protein: 6.9 g/dL (ref 6.0–8.3)

## 2013-12-14 LAB — LIPID PANEL
CHOLESTEROL: 81 mg/dL (ref 0–200)
HDL: 27 mg/dL — AB (ref >39)
LDL CALC: 32 mg/dL (ref 0–99)
Total CHOL/HDL Ratio: 3 Ratio
Triglycerides: 112 mg/dL (ref <150)
VLDL: 22 mg/dL (ref 0–40)

## 2013-12-14 LAB — MICROALBUMIN, URINE: Microalb, Ur: 2.68 mg/dL — ABNORMAL HIGH (ref 0.00–1.89)

## 2013-12-14 LAB — HEMOGLOBIN A1C
Hgb A1c MFr Bld: 7.3 % — ABNORMAL HIGH (ref <5.7)
Mean Plasma Glucose: 163 mg/dL — ABNORMAL HIGH (ref <117)

## 2013-12-14 MED ORDER — TADALAFIL 5 MG PO TABS: 5.0000 mg | ORAL_TABLET | Freq: Every day | ORAL | Status: DC | PRN

## 2013-12-14 NOTE — Progress Notes (Signed)
Subjective:    Patient ID: Ronald Dorsey, male    DOB: 20-Jun-1951, 63 y.o.   MRN: [id number]  HPI Patient is here today following up with diabetes. He is currently on Lantus and NovoLog. He also takes Januvia 100 mg by mouth daily, metformin 1000 mg by mouth twice a day, and invokana 100 mg poqday.  He states his sugars are ranging in the 150s. He lost 5 pounds since the addition of Januvia and invokana.  He denies any side effects on the medication. He denies any chest pain shortness of breath or dyspnea on exertion. He denies any myalgias or quadrant pain. He is due for a check of his fasting lab work. His diabetic eye exam and diabetic exam are up-to-date. Past Medical History  Diagnosis Date  . Arthritis   . Hypertension   . Hypercholesterolemia   . Diabetes mellitus without complication   . Apnea   . PSVT (paroxysmal supraventricular tachycardia)   . Neuromuscular disorder     sciatica   Past Surgical History  Procedure Laterality Date  . Eye surgery    . Tonsillectomy  1961  . Carpal tunnel release  2010    left wrist   Current Outpatient Prescriptions on File Prior to Visit  Medication Sig Dispense Refill  . aspirin EC 81 MG tablet Take 162 mg by mouth at bedtime.      Marland Kitchen BAYER CONTOUR NEXT TEST test strip TEST BLOODSUGAR FOUR TIMES DAILY  120 each  11  . insulin aspart (NOVOLOG) 100 UNIT/ML injection Takes as directed per sliding scale TID with meals  30 mL  3  . insulin glargine (LANTUS) 100 UNIT/ML injection 50 units bid  30 mL  3  . INSULIN SYRINGE .5CC/29G 29G X 1/2" 0.5 ML MISC USE AS DIRECTED  100 each  5  . INVOKANA 100 MG TABS TAKE 1 TABLET BY MOUTH EVERY DAY  30 tablet  2  . lisinopril (PRINIVIL,ZESTRIL) 20 MG tablet Take 2 tablets (40 mg total) by mouth daily.  180 tablet  3  . LYRICA 150 MG capsule TAKE 1 CAPSULE BY MOUTH TWICE DAILY  60 capsule  5  . meloxicam (MOBIC) 7.5 MG tablet Take 1 tablet (7.5 mg total) by mouth daily as needed. For pain/inflammation   90 tablet  5  . metFORMIN (GLUCOPHAGE) 500 MG tablet TAKE 2 TABLETS BY MOUTH TWICE DAILY- STOP ACTOS  120 tablet  5  . metoprolol succinate (TOPROL-XL) 25 MG 24 hr tablet TAKE 1 TABLET BY MOUTH EVERY DAY  90 tablet  2  . omega-3 acid ethyl esters (LOVAZA) 1 G capsule Take 2 capsules (2 g total) by mouth 2 (two) times daily.  120 capsule  11  . rosuvastatin (CRESTOR) 40 MG tablet Take 0.5 tablets (20 mg total) by mouth at bedtime.  30 tablet  1  . sitaGLIPtin (JANUVIA) 100 MG tablet Take 1 tablet (100 mg total) by mouth daily.  90 tablet  1  . ZETIA 10 MG tablet TAKE 1 TABLET BY MOUTH EVERY DAY  30 tablet  5   No current facility-administered medications on file prior to visit.   No Known Allergies History   Social History  . Marital Status: Married    Spouse Name: N/A    Number of Children: N/A  . Years of Education: N/A   Occupational History  . Not on file.   Social History Main Topics  . Smoking status: Former Research scientist (life sciences)  . Smokeless tobacco: Former Systems developer  .  Alcohol Use: 0.0 oz/week     Comment: 1 glass of wine a month or less  . Drug Use: No  . 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 Not on file   Social History Narrative  . No narrative on file      Review of Systems  All other systems reviewed and are negative.      Objective:   Physical Exam  Vitals reviewed. Constitutional: He appears well-developed and well-nourished.  Neck: Neck supple. No JVD present. No thyromegaly present.  Cardiovascular: Normal rate, regular rhythm and normal heart sounds.   Pulmonary/Chest: Effort normal and breath sounds normal. No respiratory distress. He has no wheezes. He has no rales. He exhibits no tenderness.  Abdominal: Soft. Bowel sounds are normal. He exhibits no distension. There is no tenderness. There is no rebound and no guarding.  Lymphadenopathy:    He has no cervical adenopathy.  Skin: Skin is warm. No rash noted.  No erythema. No pallor.          Assessment & Plan:  1. HLD (hyperlipidemia) Next fasting lipid panel. Goal LDL is less than 100 - Lipid panel  2. HTN (hypertension) Blood pressures well controlled. Continue current medications at the present dosages. - COMPLETE METABOLIC PANEL WITH GFR  3. Type II or unspecified type diabetes mellitus without mention of complication, not stated as uncontrolled Check hemoglobin A1c. Hemoglobin A1c is greater than 7, I would discontinue Januvia and increase Invokana to 300 mg poqday.  I also gave patient a prescription for Cialis 5 mg by mouth daily for erectile dysfunction - Hemoglobin A1c - Microalbumin, urine

## 2013-12-18 ENCOUNTER — Other Ambulatory Visit: Payer: Self-pay | Admitting: Family Medicine

## 2013-12-28 ENCOUNTER — Other Ambulatory Visit: Payer: Self-pay | Admitting: *Deleted

## 2013-12-28 ENCOUNTER — Encounter: Payer: Self-pay | Admitting: *Deleted

## 2013-12-28 MED ORDER — CANAGLIFLOZIN 300 MG PO TABS: 300.0000 mg | ORAL_TABLET | Freq: Every day | ORAL | Status: DC

## 2014-01-09 ENCOUNTER — Other Ambulatory Visit: Payer: Self-pay | Admitting: Family Medicine

## 2014-02-08 ENCOUNTER — Other Ambulatory Visit: Payer: Self-pay | Admitting: Family Medicine

## 2014-02-08 ENCOUNTER — Encounter: Payer: Self-pay | Admitting: Family Medicine

## 2014-02-08 NOTE — Telephone Encounter (Signed)
Refill appropriate and filled per protocol. 

## 2014-02-13 ENCOUNTER — Other Ambulatory Visit: Payer: Self-pay | Admitting: Family Medicine

## 2014-02-13 MED ORDER — INSULIN ASPART 100 UNIT/ML ~~LOC~~ SOLN: SUBCUTANEOUS | Status: DC

## 2014-02-13 MED ORDER — INSULIN GLARGINE 100 UNIT/ML ~~LOC~~ SOLN: SUBCUTANEOUS | Status: DC

## 2014-02-15 ENCOUNTER — Other Ambulatory Visit: Payer: Self-pay | Admitting: Family Medicine

## 2014-02-15 NOTE — Telephone Encounter (Signed)
Refill appropriate and filled per protocol. 

## 2014-03-04 ENCOUNTER — Encounter: Payer: Self-pay | Admitting: Family Medicine

## 2014-03-05 MED ORDER — METOPROLOL SUCCINATE ER 25 MG PO TB24: ORAL_TABLET | ORAL | Status: DC

## 2014-03-05 NOTE — Telephone Encounter (Signed)
Refill appropriate and filled per protocol. 

## 2014-03-11 ENCOUNTER — Other Ambulatory Visit: Payer: Self-pay | Admitting: Family Medicine

## 2014-04-04 ENCOUNTER — Other Ambulatory Visit: Payer: Self-pay | Admitting: Family Medicine

## 2014-04-05 MED ORDER — CANAGLIFLOZIN 300 MG PO TABS
300.0000 mg | ORAL_TABLET | Freq: Every day | ORAL | Status: DC
Start: 2014-04-05 — End: 2015-02-20

## 2014-04-05 NOTE — Telephone Encounter (Signed)
Prescription for Invokana 100mg  denied.   Patient should be taking Invokana 300mg  PO QD.   Correct prescription refilled.

## 2014-04-08 ENCOUNTER — Other Ambulatory Visit: Payer: Self-pay | Admitting: Family Medicine

## 2014-04-09 NOTE — Telephone Encounter (Signed)
Refill appropriate and filled per protocol. 

## 2014-04-25 ENCOUNTER — Other Ambulatory Visit: Payer: Self-pay | Admitting: Family Medicine

## 2014-05-23 ENCOUNTER — Other Ambulatory Visit: Payer: Self-pay | Admitting: Family Medicine

## 2014-05-23 NOTE — Telephone Encounter (Signed)
Medication refilled per protocol. 

## 2014-06-25 ENCOUNTER — Other Ambulatory Visit: Payer: Self-pay | Admitting: Family Medicine

## 2014-06-27 ENCOUNTER — Other Ambulatory Visit: Payer: Self-pay | Admitting: Family Medicine

## 2014-06-27 MED ORDER — INSULIN ASPART 100 UNIT/ML ~~LOC~~ SOLN: 40.0000 [IU] | Freq: Three times a day (TID) | SUBCUTANEOUS | Status: DC

## 2014-06-28 ENCOUNTER — Ambulatory Visit (INDEPENDENT_AMBULATORY_CARE_PROVIDER_SITE_OTHER): Payer: BC Managed Care – PPO | Admitting: Family Medicine

## 2014-06-28 ENCOUNTER — Encounter: Payer: Self-pay | Admitting: Family Medicine

## 2014-06-28 VITALS — BP 130/60 | HR 74 | Temp 97.7°F | Resp 18 | Ht 73.0 in | Wt 290.0 lb

## 2014-06-28 DIAGNOSIS — Z794 Long term (current) use of insulin: Secondary | ICD-10-CM

## 2014-06-28 DIAGNOSIS — E119 Type 2 diabetes mellitus without complications: Secondary | ICD-10-CM

## 2014-06-28 LAB — LIPID PANEL
Cholesterol: 74 mg/dL (ref 0–200)
HDL: 34 mg/dL — ABNORMAL LOW (ref >39)
LDL Cholesterol: 24 mg/dL (ref 0–99)
TRIGLYCERIDES: 81 mg/dL (ref <150)
Total CHOL/HDL Ratio: 2.2 Ratio
VLDL: 16 mg/dL (ref 0–40)

## 2014-06-28 LAB — COMPLETE METABOLIC PANEL WITH GFR
ALBUMIN: 4.3 g/dL (ref 3.5–5.2)
ALK PHOS: 62 U/L (ref 39–117)
ALT: 26 U/L (ref 0–53)
AST: 20 U/L (ref 0–37)
BUN: 25 mg/dL — ABNORMAL HIGH (ref 6–23)
CO2: 22 mEq/L (ref 19–32)
Calcium: 9.7 mg/dL (ref 8.4–10.5)
Chloride: 103 mEq/L (ref 96–112)
Creat: 0.9 mg/dL (ref 0.50–1.35)
GFR, Est African American: >89 mL/min
GFR, Est Non African American: >89 mL/min
Glucose, Bld: 59 mg/dL — ABNORMAL LOW (ref 70–99)
POTASSIUM: 4.3 meq/L (ref 3.5–5.3)
Sodium: 138 mEq/L (ref 135–145)
Total Bilirubin: 0.6 mg/dL (ref 0.2–1.2)
Total Protein: 6.9 g/dL (ref 6.0–8.3)

## 2014-06-28 LAB — HEMOGLOBIN A1C
HEMOGLOBIN A1C: 7.8 % — AB (ref <5.7)
MEAN PLASMA GLUCOSE: 177 mg/dL — AB (ref <117)

## 2014-06-28 MED ORDER — INSULIN ASPART 100 UNIT/ML ~~LOC~~ SOLN: 40.0000 [IU] | Freq: Three times a day (TID) | SUBCUTANEOUS | Status: DC

## 2014-06-28 MED ORDER — INSULIN GLARGINE 100 UNIT/ML ~~LOC~~ SOLN: SUBCUTANEOUS | Status: DC

## 2014-06-28 NOTE — Progress Notes (Signed)
Subjective:    Patient ID: Ronald Dorsey, male    DOB: [birth year], 63 y.o.   MRN: [id number]  HPI  Patient is here today following up with diabetes. He is currently on Lantus and NovoLog. He also takes metformin 1000 mg by mouth twice a day, and invokana 300 mg poqday.  He states his sugars are ranging in the 150s. He denies any side effects on the medication. He denies any chest pain shortness of breath or dyspnea on exertion. He denies any myalgias or quadrant pain. He is due for a check of his fasting lab work. His diabetic eye exam and diabetic exam are up-to-date. Past Medical History  Diagnosis Date  . Arthritis   . Hypertension   . Hypercholesterolemia   . Diabetes mellitus without complication   . Apnea   . PSVT (paroxysmal supraventricular tachycardia)   . Neuromuscular disorder     sciatica   Past Surgical History  Procedure Laterality Date  . Eye surgery    . Tonsillectomy  1961  . Carpal tunnel release  2010    left wrist   Current Outpatient Prescriptions on File Prior to Visit  Medication Sig Dispense Refill  . aspirin EC 81 MG tablet Take 162 mg by mouth at bedtime.    Marland Kitchen BAYER CONTOUR NEXT TEST test strip TEST BLOODSUGAR FOUR TIMES DAILY 120 each 11  . Canagliflozin (INVOKANA) 300 MG TABS Take 1 tablet (300 mg total) by mouth daily. 30 tablet 3  . INSULIN SYRINGE .5CC/29G 29G X 1/2" 0.5 ML MISC USE AS DIRECTED 100 each 6  . lisinopril (PRINIVIL,ZESTRIL) 20 MG tablet TAKE 2 TABLETS BY MOUTH DAILY 180 tablet 3  . LOVAZA 1 G capsule TAKE 2 CAPSULES BY MOUTH TWICE DAILY 120 capsule 6  . LYRICA 150 MG capsule TAKE 1 CAPSULE BY MOUTH TWICE DAILY 60 capsule 5  . meloxicam (MOBIC) 7.5 MG tablet Take 1 tablet (7.5 mg total) by mouth daily as needed. For pain/inflammation 90 tablet 5  . metoprolol succinate (TOPROL-XL) 25 MG 24 hr tablet TAKE 1 TABLET BY MOUTH EVERY DAY 90 tablet 2  . rosuvastatin (CRESTOR) 40 MG tablet Take 0.5 tablets (20 mg total) by mouth at bedtime.  30 tablet 1  . tadalafil (CIALIS) 5 MG tablet Take 1 tablet (5 mg total) by mouth daily as needed for erectile dysfunction. 30 tablet 3  . ZETIA 10 MG tablet TAKE 1 TABLET BY MOUTH EVERY DAY 30 tablet 3  . metFORMIN (GLUCOPHAGE) 500 MG tablet TAKE 2 TABLETS BY MOUTH TWICE DAILY 120 tablet 3   No current facility-administered medications on file prior to visit.   No Known Allergies History   Social History  . Marital Status: Married    Spouse Name: N/A    Number of Children: N/A  . Years of Education: N/A   Occupational History  . Not on file.   Social History Main Topics  . Smoking status: Former Research scientist (life sciences)  . Smokeless tobacco: Former Systems developer  . Alcohol Use: 0.0 oz/week     Comment: 1 glass of wine a month or less  . Drug Use: No  . 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 Not on file   Social History Narrative      Review of Systems  All other systems reviewed and are negative.      Objective:   Physical Exam  Constitutional: He appears well-developed and well-nourished.  Neck: Neck supple.  Cardiovascular: Normal rate, regular rhythm and normal heart sounds.   No murmur heard. Pulmonary/Chest: Effort normal and breath sounds normal. No respiratory distress. He has no wheezes. He has no rales.  Abdominal: Soft. Bowel sounds are normal. He exhibits no distension. There is no tenderness. There is no rebound and no guarding.  Lymphadenopathy:    He has no cervical adenopathy.  Vitals reviewed.         Assessment & Plan:  1. HLD (hyperlipidemia) Get fasting lipid panel. Goal LDL is less than 100 - Lipid panel  2. HTN (hypertension) Blood pressure is well controlled. Continue current medications at the present dosages. - COMPLETE METABOLIC PANEL WITH GFR  3. Type II or unspecified type diabetes mellitus without mention of complication, not stated as uncontrolled Check hemoglobin A1c.  - Hemoglobin A1c -  Microalbumin, urine

## 2014-06-29 LAB — MICROALBUMIN, URINE: MICROALB UR: 1.5 mg/dL (ref <2.0)

## 2014-07-10 ENCOUNTER — Encounter: Payer: Self-pay | Admitting: Family Medicine

## 2014-08-31 ENCOUNTER — Encounter: Payer: Self-pay | Admitting: Family Medicine

## 2014-08-31 ENCOUNTER — Ambulatory Visit (INDEPENDENT_AMBULATORY_CARE_PROVIDER_SITE_OTHER): Payer: BLUE CROSS/BLUE SHIELD | Admitting: Family Medicine

## 2014-08-31 VITALS — BP 130/60 | HR 68 | Temp 98.0°F | Resp 18 | Ht 73.0 in | Wt 297.0 lb

## 2014-08-31 DIAGNOSIS — H532 Diplopia: Secondary | ICD-10-CM

## 2014-08-31 DIAGNOSIS — J019 Acute sinusitis, unspecified: Secondary | ICD-10-CM

## 2014-08-31 MED ORDER — AMOXICILLIN-POT CLAVULANATE 875-125 MG PO TABS: 1.0000 | ORAL_TABLET | Freq: Two times a day (BID) | ORAL | Status: DC

## 2014-08-31 NOTE — Progress Notes (Signed)
Subjective:    Patient ID: Ronald Dorsey, male    DOB: Mar 16, 1951, 64 y.o.   MRN: [id number]  HPI Patient has had severe pain and pressure behind both eyes sinus pressure, rhinorrhea, postnasal drip, and headache for 3 days. He also reports occasional diplopia when straining to look for foreign object. He denies any trouble swallowing. He denies any muscle weakness. He denies any fatigue. On examination today extraocular movements are intact. Patient's visual exam is normal. Pupils are equal round reactive to light. I am unable to trigger or illicit the patient's diplopia. He states his symptoms are better today. Past Medical History  Diagnosis Date  . Arthritis   . Hypertension   . Hypercholesterolemia   . Diabetes mellitus without complication   . Apnea   . PSVT (paroxysmal supraventricular tachycardia)   . Neuromuscular disorder     sciatica   Past Surgical History  Procedure Laterality Date  . Eye surgery    . Tonsillectomy  1961  . Carpal tunnel release  2010    left wrist   Current Outpatient Prescriptions on File Prior to Visit  Medication Sig Dispense Refill  . aspirin EC 81 MG tablet Take 162 mg by mouth at bedtime.    Marland Kitchen BAYER CONTOUR NEXT TEST test strip TEST BLOODSUGAR FOUR TIMES DAILY 120 each 11  . Canagliflozin (INVOKANA) 300 MG TABS Take 1 tablet (300 mg total) by mouth daily. 30 tablet 3  . insulin aspart (NOVOLOG) 100 UNIT/ML injection Inject 40 Units into the skin 3 (three) times daily with meals. 30 mL 3  . insulin glargine (LANTUS) 100 UNIT/ML injection 50 units bid 30 mL 3  . INSULIN SYRINGE .5CC/29G 29G X 1/2" 0.5 ML MISC USE AS DIRECTED 100 each 6  . lisinopril (PRINIVIL,ZESTRIL) 20 MG tablet TAKE 2 TABLETS BY MOUTH DAILY 180 tablet 3  . LOVAZA 1 G capsule TAKE 2 CAPSULES BY MOUTH TWICE DAILY 120 capsule 6  . LYRICA 150 MG capsule TAKE 1 CAPSULE BY MOUTH TWICE DAILY 60 capsule 5  . meloxicam (MOBIC) 7.5 MG tablet Take 1 tablet (7.5 mg total) by mouth  daily as needed. For pain/inflammation 90 tablet 5  . metFORMIN (GLUCOPHAGE) 500 MG tablet TAKE 2 TABLETS BY MOUTH TWICE DAILY 120 tablet 3  . metoprolol succinate (TOPROL-XL) 25 MG 24 hr tablet TAKE 1 TABLET BY MOUTH EVERY DAY 90 tablet 2  . rosuvastatin (CRESTOR) 40 MG tablet Take 0.5 tablets (20 mg total) by mouth at bedtime. 30 tablet 1  . tadalafil (CIALIS) 5 MG tablet Take 1 tablet (5 mg total) by mouth daily as needed for erectile dysfunction. 30 tablet 3  . ZETIA 10 MG tablet TAKE 1 TABLET BY MOUTH EVERY DAY 30 tablet 3   No current facility-administered medications on file prior to visit.   No Known Allergies History   Social History  . Marital Status: Married    Spouse Name: N/A    Number of Children: N/A  . Years of Education: N/A   Occupational History  . Not on file.   Social History Main Topics  . Smoking status: Former Research scientist (life sciences)  . Smokeless tobacco: Former Systems developer  . Alcohol Use: 0.0 oz/week     Comment: 1 glass of wine a month or less  . Drug Use: No  . 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 Not on file   Social History Narrative  Review of Systems  All other systems reviewed and are negative.      Objective:   Physical Exam  Constitutional: He is oriented to person, place, and time. He appears well-developed and well-nourished.  HENT:  Right Ear: Hearing, tympanic membrane, external ear and ear canal normal.  Left Ear: Hearing, tympanic membrane, external ear and ear canal normal.  Nose: Mucosal edema and rhinorrhea present. Right sinus exhibits frontal sinus tenderness. Right sinus exhibits no maxillary sinus tenderness. Left sinus exhibits frontal sinus tenderness. Left sinus exhibits no maxillary sinus tenderness.  Mouth/Throat: Oropharynx is clear and moist.  Eyes: Conjunctivae, EOM and lids are normal. Pupils are equal, round, and reactive to light.  Cardiovascular: Normal rate, regular  rhythm and normal heart sounds.   Pulmonary/Chest: Effort normal and breath sounds normal. No respiratory distress. He has no wheezes. He has no rales.  Neurological: He is alert and oriented to person, place, and time. He has normal reflexes. He displays normal reflexes. No cranial nerve deficit. He exhibits normal muscle tone. Coordination normal.  Vitals reviewed.         Assessment & Plan:  Acute rhinosinusitis - Plan: amoxicillin-clavulanate (AUGMENTIN) 875-125 MG per tablet  Diplopia  Patient certainly has a sinus infection. However I am unsure if the sinus infection is the cause of his transient episodic diplopia. I have recommended that he see his eye doctor immediately. He will call him today and try to schedule an appointment either for today or tomorrow or first of next week. I will treat the patient sinus infection immediately with Augmentin 875 mg by mouth twice a day. It is possible he may have some ethmoid sinusitis which is creating a pressure-like sensation that may be contributing to the sensation of blurry vision. Ocular exam today is completely normal. I am unable to elicit the diplopia. There is no evidence of a third, fourth, or sixth cranial nerve palsy. There is no evidence of a stroke. I would treat myasthenia gravis in the differential diagnosis for transient diplopia but at the present time there are no other symptoms. As the patient sinus infection clears, if he continues to have diplopia, I would recommend imaging of the brain with an MRI and also a referral to a neuro-ophthalmologist. Recheck with me immediately next week if symptoms are not improving.

## 2014-09-05 LAB — HM DIABETES EYE EXAM

## 2014-09-06 ENCOUNTER — Encounter: Payer: Self-pay | Admitting: Family Medicine

## 2014-09-10 MED ORDER — PREDNISONE 20 MG PO TABS: ORAL_TABLET | ORAL | Status: DC

## 2014-09-26 ENCOUNTER — Encounter: Payer: Self-pay | Admitting: Family Medicine

## 2014-09-27 MED ORDER — PREGABALIN 150 MG PO CAPS: 150.0000 mg | ORAL_CAPSULE | Freq: Two times a day (BID) | ORAL | Status: DC

## 2014-09-27 MED ORDER — TADALAFIL 5 MG PO TABS: 5.0000 mg | ORAL_TABLET | Freq: Every day | ORAL | Status: DC | PRN

## 2014-10-16 ENCOUNTER — Other Ambulatory Visit: Payer: Self-pay | Admitting: Family Medicine

## 2014-10-16 MED ORDER — OMEGA-3-ACID ETHYL ESTERS 1 G PO CAPS: 2.0000 | ORAL_CAPSULE | Freq: Two times a day (BID) | ORAL | Status: DC

## 2014-10-16 NOTE — Telephone Encounter (Signed)
Med sent to pharm 

## 2014-10-17 ENCOUNTER — Other Ambulatory Visit: Payer: Self-pay | Admitting: Family Medicine

## 2014-10-17 MED ORDER — INSULIN ASPART 100 UNIT/ML ~~LOC~~ SOLN: 40.0000 [IU] | Freq: Three times a day (TID) | SUBCUTANEOUS | Status: DC

## 2014-10-17 NOTE — Telephone Encounter (Signed)
Med sent to pharm 

## 2014-10-29 ENCOUNTER — Other Ambulatory Visit: Payer: Self-pay | Admitting: Family Medicine

## 2014-10-29 MED ORDER — EZETIMIBE 10 MG PO TABS: 10.0000 mg | ORAL_TABLET | Freq: Every day | ORAL | Status: DC

## 2014-11-14 ENCOUNTER — Encounter: Payer: Self-pay | Admitting: Family Medicine

## 2014-11-14 MED ORDER — INSULIN GLARGINE 100 UNIT/ML ~~LOC~~ SOLN: SUBCUTANEOUS | Status: DC

## 2014-11-14 MED ORDER — METFORMIN HCL 500 MG PO TABS: 1000.0000 mg | ORAL_TABLET | Freq: Two times a day (BID) | ORAL | Status: DC

## 2014-11-28 ENCOUNTER — Other Ambulatory Visit: Payer: Self-pay | Admitting: Family Medicine

## 2014-11-28 MED ORDER — "INSULIN SYRINGE 29G X 1/2"" 0.5 ML MISC": Status: DC

## 2014-11-28 NOTE — Telephone Encounter (Signed)
Med sent to pharm 

## 2014-12-19 ENCOUNTER — Telehealth: Payer: Self-pay | Admitting: Family Medicine

## 2014-12-19 NOTE — Telephone Encounter (Signed)
PA submitted through [email or web address] ([place])

## 2014-12-20 NOTE — Telephone Encounter (Signed)
[employer] faxed additional paperwork - this was filled out and faxed back

## 2014-12-26 ENCOUNTER — Encounter: Payer: Self-pay | Admitting: Family Medicine

## 2014-12-26 ENCOUNTER — Other Ambulatory Visit: Payer: Self-pay | Admitting: Family Medicine

## 2014-12-26 MED ORDER — GLUCOSE BLOOD VI STRP: ORAL_STRIP | Status: DC

## 2014-12-26 NOTE — Telephone Encounter (Signed)
Approved for 4 tablets per month and anything above that can not be requested - pt aware via mychart and faxed to pharm.

## 2015-01-09 ENCOUNTER — Other Ambulatory Visit: Payer: Self-pay | Admitting: Family Medicine

## 2015-01-09 MED ORDER — MELOXICAM 7.5 MG PO TABS: 7.5000 mg | ORAL_TABLET | Freq: Every day | ORAL | Status: DC | PRN

## 2015-01-09 NOTE — Telephone Encounter (Signed)
Medication called/sent to requested pharmacy  

## 2015-01-22 ENCOUNTER — Other Ambulatory Visit: Payer: Self-pay | Admitting: Family Medicine

## 2015-01-22 MED ORDER — INSULIN ASPART 100 UNIT/ML ~~LOC~~ SOLN: 40.0000 [IU] | Freq: Three times a day (TID) | SUBCUTANEOUS | Status: DC

## 2015-01-22 NOTE — Telephone Encounter (Signed)
Medication called/sent to requested pharmacy  

## 2015-02-01 ENCOUNTER — Encounter: Payer: Self-pay | Admitting: Family Medicine

## 2015-02-01 ENCOUNTER — Ambulatory Visit (INDEPENDENT_AMBULATORY_CARE_PROVIDER_SITE_OTHER): Payer: BLUE CROSS/BLUE SHIELD | Admitting: Family Medicine

## 2015-02-01 VITALS — BP 138/70 | HR 62 | Temp 97.7°F | Resp 18 | Ht 73.0 in | Wt 290.0 lb

## 2015-02-01 DIAGNOSIS — E785 Hyperlipidemia, unspecified: Secondary | ICD-10-CM | POA: Diagnosis not present

## 2015-02-01 DIAGNOSIS — E119 Type 2 diabetes mellitus without complications: Secondary | ICD-10-CM | POA: Diagnosis not present

## 2015-02-01 DIAGNOSIS — I10 Essential (primary) hypertension: Secondary | ICD-10-CM

## 2015-02-01 DIAGNOSIS — Z794 Long term (current) use of insulin: Secondary | ICD-10-CM

## 2015-02-01 DIAGNOSIS — Z23 Encounter for immunization: Secondary | ICD-10-CM

## 2015-02-01 LAB — LIPID PANEL
Cholesterol: 74 mg/dL (ref 0–200)
HDL: 26 mg/dL — AB (ref >=40)
LDL Cholesterol: 17 mg/dL (ref 0–99)
Total CHOL/HDL Ratio: 2.8 Ratio
Triglycerides: 155 mg/dL — ABNORMAL HIGH (ref <150)
VLDL: 31 mg/dL (ref 0–40)

## 2015-02-01 LAB — COMPLETE METABOLIC PANEL WITH GFR
ALT: 30 U/L (ref 0–53)
AST: 26 U/L (ref 0–37)
Albumin: 4.3 g/dL (ref 3.5–5.2)
Alkaline Phosphatase: 55 U/L (ref 39–117)
BILIRUBIN TOTAL: 0.6 mg/dL (ref 0.2–1.2)
BUN: 25 mg/dL — ABNORMAL HIGH (ref 6–23)
CALCIUM: 9.5 mg/dL (ref 8.4–10.5)
CO2: 24 mEq/L (ref 19–32)
Chloride: 107 mEq/L (ref 96–112)
Creat: 1.06 mg/dL (ref 0.50–1.35)
GFR, EST AFRICAN AMERICAN: 85 mL/min
GFR, Est Non African American: 74 mL/min
Glucose, Bld: 135 mg/dL — ABNORMAL HIGH (ref 70–99)
Potassium: 4.5 mEq/L (ref 3.5–5.3)
SODIUM: 139 meq/L (ref 135–145)
TOTAL PROTEIN: 6.4 g/dL (ref 6.0–8.3)

## 2015-02-01 LAB — HEMOGLOBIN A1C
HEMOGLOBIN A1C: 7.3 % — AB (ref <5.7)
Mean Plasma Glucose: 163 mg/dL — ABNORMAL HIGH (ref <117)

## 2015-02-01 MED ORDER — PIOGLITAZONE HCL 30 MG PO TABS: 30.0000 mg | ORAL_TABLET | Freq: Every day | ORAL | Status: DC

## 2015-02-01 MED ORDER — LISINOPRIL 20 MG PO TABS: 40.0000 mg | ORAL_TABLET | Freq: Every day | ORAL | Status: DC

## 2015-02-01 MED ORDER — TADALAFIL 20 MG PO TABS: 10.0000 mg | ORAL_TABLET | ORAL | Status: DC | PRN

## 2015-02-01 NOTE — Addendum Note (Signed)
Addended by: Shary Decamp B on: 02/01/2015 10:42 AM   Modules accepted: Orders

## 2015-02-01 NOTE — Progress Notes (Signed)
Subjective:    Patient ID: Ronald Dorsey, male    DOB: 10-31-1950, 64 y.o.   MRN: [id number]  HPI 06/28/14 Patient is here today following up with diabetes. He is currently on Lantus and NovoLog. He also takes metformin 1000 mg by mouth twice a day, and invokana 300 mg poqday.  He states his sugars are ranging in the 150s. He denies any side effects on the medication. He denies any chest pain shortness of breath or dyspnea on exertion. He denies any myalgias or quadrant pain. He is due for a check of his fasting lab work. His diabetic eye exam and diabetic exam are up-to-date.  At that time, my plan was: 1. HLD (hyperlipidemia) Get fasting lipid panel. Goal LDL is less than 100 - Lipid panel  2. HTN (hypertension) Blood pressure is well controlled. Continue current medications at the present dosages. - COMPLETE METABOLIC PANEL WITH GFR  3. Type II or unspecified type diabetes mellitus without mention of complication, not stated as uncontrolled Check hemoglobin A1c.  - Hemoglobin A1c - Microalbumin, urine  02/01/15 He is here today for follow up.  Patient is due for Pneumovax 23. Diabetic foot exam was performed today and is normal. Diabetic eye exam is up-to-date.  Patient states that his fasting blood sugars in the morning are typically 180. His 2 postprandial sugars are typically 120-180. He is currently on Lantus 50 units twice daily and NovoLog 20-40 units with meals depending upon what he is eating. His fasting blood sugars seem to be his biggest issues at the present time. He also reports erectile dysfunction and would like to try 20 mg a day Cialis. In the past he is also use prostaglandin injections which worked well but he would like to try the Cialis first. His blood pressures well controlled. He is due for fasting lipid panel. Past Medical History  Diagnosis Date  . Arthritis   . Hypertension   . Hypercholesterolemia   . Diabetes mellitus without complication   . Apnea   .  PSVT (paroxysmal supraventricular tachycardia)   . Neuromuscular disorder     sciatica   Past Surgical History  Procedure Laterality Date  . Eye surgery    . Tonsillectomy  1961  . Carpal tunnel release  2010    left wrist   Current Outpatient Prescriptions on File Prior to Visit  Medication Sig Dispense Refill  . amoxicillin-clavulanate (AUGMENTIN) 875-125 MG per tablet Take 1 tablet by mouth 2 (two) times daily. 20 tablet 0  . aspirin EC 81 MG tablet Take 162 mg by mouth at bedtime.    . Canagliflozin (INVOKANA) 300 MG TABS Take 1 tablet (300 mg total) by mouth daily. 30 tablet 3  . ezetimibe (ZETIA) 10 MG tablet Take 1 tablet (10 mg total) by mouth daily. 30 tablet 3  . glucose blood (BAYER CONTOUR NEXT TEST) test strip TEST BLOOD SUGAR FOUR TIMES DAILY 125 each 11  . insulin aspart (NOVOLOG) 100 UNIT/ML injection Inject 40 Units into the skin 3 (three) times daily with meals. 30 mL 3  . insulin glargine (LANTUS) 100 UNIT/ML injection 50 units bid 30 mL 3  . INSULIN SYRINGE .5CC/29G 29G X 1/2" 0.5 ML MISC USE AS DIRECTED 100 each 6  . lisinopril (PRINIVIL,ZESTRIL) 20 MG tablet TAKE 2 TABLETS BY MOUTH DAILY 180 tablet 3  . meloxicam (MOBIC) 7.5 MG tablet Take 1 tablet (7.5 mg total) by mouth daily as needed. For pain/inflammation 90 tablet 4  . metFORMIN (  GLUCOPHAGE) 500 MG tablet Take 2 tablets (1,000 mg total) by mouth 2 (two) times daily. 120 tablet 3  . metoprolol succinate (TOPROL-XL) 25 MG 24 hr tablet TAKE 1 TABLET BY MOUTH EVERY DAY 90 tablet 2  . omega-3 acid ethyl esters (LOVAZA) 1 G capsule Take 2 capsules (2 g total) by mouth 2 (two) times daily. 120 capsule 6  . predniSONE (DELTASONE) 20 MG tablet Three tablets by mouth on day 1 & 2, two tablets by mouth on day 3 & 4, then one tablet by mouth on day 5 & 6. 12 tablet 0  . pregabalin (LYRICA) 150 MG capsule Take 1 capsule (150 mg total) by mouth 2 (two) times daily. 60 capsule 5  . rosuvastatin (CRESTOR) 40 MG tablet Take 0.5  tablets (20 mg total) by mouth at bedtime. 30 tablet 1  . tadalafil (CIALIS) 5 MG tablet Take 1 tablet (5 mg total) by mouth daily as needed for erectile dysfunction. 30 tablet 3   No current facility-administered medications on file prior to visit.   No Known Allergies History   Social History  . Marital Status: Married    Spouse Name: N/A  . Number of Children: N/A  . Years of Education: N/A   Occupational History  . Not on file.   Social History Main Topics  . Smoking status: Former Research scientist (life sciences)  . Smokeless tobacco: Former Systems developer  . Alcohol Use: 0.0 oz/week     Comment: 1 glass of wine a month or less  . Drug Use: No  . 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 Not on file   Social History Narrative      Review of Systems  All other systems reviewed and are negative.      Objective:   Physical Exam  Constitutional: He appears well-developed and well-nourished.  Neck: Neck supple.  Cardiovascular: Normal rate, regular rhythm and normal heart sounds.   No murmur heard. Pulmonary/Chest: Effort normal and breath sounds normal. No respiratory distress. He has no wheezes. He has no rales.  Abdominal: Soft. Bowel sounds are normal. He exhibits no distension. There is no tenderness. There is no rebound and no guarding.  Lymphadenopathy:    He has no cervical adenopathy.  Vitals reviewed.         Assessment & Plan:  Diabetes mellitus, type II, insulin dependent - Plan: COMPLETE METABOLIC PANEL WITH GFR, Lipid panel, Hemoglobin A1c, Microalbumin, urine, pioglitazone (ACTOS) 30 MG tablet  HLD (hyperlipidemia)  Essential hypertension  Add Actos 30 mg a day as an insulin sensitizer. Check hemoglobin A1c. Goal hemoglobin A1c is less than 7. I will also check a fasting lipid panel. Goal LDL cholesterol is less than 100. Patient's blood pressure is at goal today and I'll make no changes in his medication without regard.  Diabetic eye exam, diabetic foot exam are up-to-date. Patient received Pneumovax 23 today in clinic.

## 2015-02-02 LAB — MICROALBUMIN, URINE: MICROALB UR: 2.2 mg/dL — AB (ref <2.0)

## 2015-02-04 ENCOUNTER — Encounter: Payer: Self-pay | Admitting: Family Medicine

## 2015-02-10 ENCOUNTER — Encounter: Payer: Self-pay | Admitting: Family Medicine

## 2015-02-10 DIAGNOSIS — E785 Hyperlipidemia, unspecified: Secondary | ICD-10-CM

## 2015-02-11 MED ORDER — ROSUVASTATIN CALCIUM 40 MG PO TABS: 20.0000 mg | ORAL_TABLET | Freq: Every day | ORAL | Status: DC

## 2015-02-20 ENCOUNTER — Telehealth: Payer: Self-pay | Admitting: Family Medicine

## 2015-02-20 MED ORDER — CANAGLIFLOZIN 300 MG PO TABS: 300.0000 mg | ORAL_TABLET | Freq: Every day | ORAL | Status: DC

## 2015-02-20 NOTE — Telephone Encounter (Signed)
Medication called/sent to requested pharmacy  

## 2015-03-05 ENCOUNTER — Other Ambulatory Visit: Payer: Self-pay | Admitting: Family Medicine

## 2015-03-05 MED ORDER — INSULIN GLARGINE 100 UNIT/ML ~~LOC~~ SOLN: SUBCUTANEOUS | Status: DC

## 2015-03-05 NOTE — Telephone Encounter (Signed)
Medication refilled per protocol. 

## 2015-03-08 ENCOUNTER — Other Ambulatory Visit: Payer: Self-pay | Admitting: Family Medicine

## 2015-03-08 MED ORDER — METFORMIN HCL 500 MG PO TABS: 1000.0000 mg | ORAL_TABLET | Freq: Two times a day (BID) | ORAL | Status: DC

## 2015-03-08 NOTE — Telephone Encounter (Signed)
Medication refilled per protocol. 

## 2015-03-14 ENCOUNTER — Other Ambulatory Visit: Payer: Self-pay | Admitting: Family Medicine

## 2015-03-14 MED ORDER — GLUCOSE BLOOD VI STRP: ORAL_STRIP | Status: DC

## 2015-03-14 NOTE — Telephone Encounter (Signed)
Test strips refilled

## 2015-03-18 ENCOUNTER — Other Ambulatory Visit: Payer: Self-pay | Admitting: Family Medicine

## 2015-03-18 MED ORDER — EZETIMIBE 10 MG PO TABS: 10.0000 mg | ORAL_TABLET | Freq: Every day | ORAL | Status: DC

## 2015-03-18 NOTE — Telephone Encounter (Signed)
Medication refilled per protocol. 

## 2015-04-10 ENCOUNTER — Telehealth: Payer: Self-pay | Admitting: Family Medicine

## 2015-04-10 NOTE — Telephone Encounter (Signed)
PA for Cialis thru CMM case [id number].  Approved from [facility]  12/19/14 - 08/23/2038.  Please note benefits limits apply for the treatment of ED, typically 4 per 30 days; over this limit can not be requested.  BCBS ref: Rod Holler  Faxed to pharmacy

## 2015-04-12 ENCOUNTER — Telehealth: Payer: Self-pay | Admitting: *Deleted

## 2015-04-12 ENCOUNTER — Other Ambulatory Visit: Payer: Self-pay | Admitting: *Deleted

## 2015-04-12 DIAGNOSIS — G709 Myoneural disorder, unspecified: Secondary | ICD-10-CM

## 2015-04-12 MED ORDER — PREGABALIN 150 MG PO CAPS: 150.0000 mg | ORAL_CAPSULE | Freq: Two times a day (BID) | ORAL | Status: DC

## 2015-04-12 NOTE — Telephone Encounter (Signed)
Called in Script for Lyrica bid #60, 5 refills.

## 2015-04-12 NOTE — Telephone Encounter (Signed)
ok 

## 2015-04-12 NOTE — Telephone Encounter (Signed)
Fax asking for Lyrica 150mg  refill.   OK to refill?

## 2015-04-26 ENCOUNTER — Other Ambulatory Visit: Payer: Self-pay | Admitting: Family Medicine

## 2015-04-26 MED ORDER — INSULIN ASPART 100 UNIT/ML ~~LOC~~ SOLN: 40.0000 [IU] | Freq: Three times a day (TID) | SUBCUTANEOUS | Status: DC

## 2015-04-26 NOTE — Telephone Encounter (Signed)
Medication refilled per protocol. 

## 2015-05-10 ENCOUNTER — Other Ambulatory Visit: Payer: Self-pay | Admitting: Family Medicine

## 2015-05-10 MED ORDER — OMEGA-3-ACID ETHYL ESTERS 1 G PO CAPS: 2.0000 | ORAL_CAPSULE | Freq: Two times a day (BID) | ORAL | Status: DC

## 2015-05-10 NOTE — Telephone Encounter (Signed)
Medication refilled per protocol. 

## 2015-05-22 ENCOUNTER — Other Ambulatory Visit: Payer: Self-pay | Admitting: Family Medicine

## 2015-05-22 DIAGNOSIS — Z794 Long term (current) use of insulin: Principal | ICD-10-CM

## 2015-05-22 DIAGNOSIS — E119 Type 2 diabetes mellitus without complications: Secondary | ICD-10-CM

## 2015-05-22 MED ORDER — PIOGLITAZONE HCL 30 MG PO TABS: 30.0000 mg | ORAL_TABLET | Freq: Every day | ORAL | Status: DC

## 2015-05-27 ENCOUNTER — Encounter: Payer: Self-pay | Admitting: Family Medicine

## 2015-05-28 MED ORDER — ALPROSTADIL (VASODILATOR) 40 MCG IC KIT: 40.0000 ug | PACK | INTRACAVERNOUS | Status: DC | PRN

## 2015-05-28 NOTE — Telephone Encounter (Signed)
RX printed, left up front and patient aware to pick up via mychart 

## 2015-06-20 ENCOUNTER — Other Ambulatory Visit: Payer: Self-pay | Admitting: *Deleted

## 2015-06-20 MED ORDER — CANAGLIFLOZIN 300 MG PO TABS: 300.0000 mg | ORAL_TABLET | Freq: Every day | ORAL | Status: DC

## 2015-06-20 NOTE — Telephone Encounter (Signed)
Received fax requesting refill on Invokana.   Refill appropriate and filled per protocol.

## 2015-06-28 ENCOUNTER — Other Ambulatory Visit: Payer: Self-pay | Admitting: Family Medicine

## 2015-06-28 MED ORDER — INSULIN GLARGINE 100 UNIT/ML ~~LOC~~ SOLN: SUBCUTANEOUS | Status: DC

## 2015-06-28 NOTE — Telephone Encounter (Signed)
Medication refilled per protocol. 

## 2015-07-17 ENCOUNTER — Encounter: Payer: Self-pay | Admitting: Family Medicine

## 2015-07-17 ENCOUNTER — Other Ambulatory Visit: Payer: Self-pay | Admitting: Family Medicine

## 2015-07-17 MED ORDER — METOPROLOL SUCCINATE ER 25 MG PO TB24: ORAL_TABLET | ORAL | Status: DC

## 2015-07-17 NOTE — Telephone Encounter (Signed)
Medication refill for one time only.  Patient needs to be seen.  Letter sent for patient to call and schedule 

## 2015-07-22 ENCOUNTER — Other Ambulatory Visit: Payer: Self-pay | Admitting: Family Medicine

## 2015-07-22 MED ORDER — "INSULIN SYRINGE 29G X 1/2"" 0.5 ML MISC": Status: DC

## 2015-08-20 ENCOUNTER — Encounter: Payer: Self-pay | Admitting: Family Medicine

## 2015-08-20 ENCOUNTER — Other Ambulatory Visit: Payer: Self-pay | Admitting: *Deleted

## 2015-08-20 ENCOUNTER — Ambulatory Visit (INDEPENDENT_AMBULATORY_CARE_PROVIDER_SITE_OTHER): Payer: BLUE CROSS/BLUE SHIELD | Admitting: Family Medicine

## 2015-08-20 VITALS — BP 112/62 | HR 80 | Temp 98.3°F | Resp 18 | Ht 73.0 in | Wt 303.0 lb

## 2015-08-20 DIAGNOSIS — I10 Essential (primary) hypertension: Secondary | ICD-10-CM | POA: Diagnosis not present

## 2015-08-20 DIAGNOSIS — E119 Type 2 diabetes mellitus without complications: Secondary | ICD-10-CM | POA: Diagnosis not present

## 2015-08-20 DIAGNOSIS — Z23 Encounter for immunization: Secondary | ICD-10-CM | POA: Diagnosis not present

## 2015-08-20 DIAGNOSIS — Z794 Long term (current) use of insulin: Secondary | ICD-10-CM | POA: Diagnosis not present

## 2015-08-20 DIAGNOSIS — E785 Hyperlipidemia, unspecified: Secondary | ICD-10-CM | POA: Diagnosis not present

## 2015-08-20 DIAGNOSIS — IMO0001 Reserved for inherently not codable concepts without codable children: Secondary | ICD-10-CM

## 2015-08-20 LAB — CBC WITH DIFFERENTIAL/PLATELET
BASOS ABS: 0.1 10*3/uL (ref 0.0–0.1)
BASOS PCT: 1 % (ref 0–1)
Eosinophils Absolute: 0.6 10*3/uL (ref 0.0–0.7)
Eosinophils Relative: 6 % — ABNORMAL HIGH (ref 0–5)
HEMATOCRIT: 48.9 % (ref 39.0–52.0)
HEMOGLOBIN: 16.5 g/dL (ref 13.0–17.0)
LYMPHS PCT: 27 % (ref 12–46)
Lymphs Abs: 2.5 10*3/uL (ref 0.7–4.0)
MCH: 29.9 pg (ref 26.0–34.0)
MCHC: 33.7 g/dL (ref 30.0–36.0)
MCV: 88.7 fL (ref 78.0–100.0)
MPV: 10.6 fL (ref 8.6–12.4)
Monocytes Absolute: 0.8 10*3/uL (ref 0.1–1.0)
Monocytes Relative: 9 % (ref 3–12)
NEUTROS ABS: 5.4 10*3/uL (ref 1.7–7.7)
NEUTROS PCT: 57 % (ref 43–77)
Platelets: 143 10*3/uL — ABNORMAL LOW (ref 150–400)
RBC: 5.51 MIL/uL (ref 4.22–5.81)
RDW: 14.3 % (ref 11.5–15.5)
WBC: 9.4 10*3/uL (ref 4.0–10.5)

## 2015-08-20 LAB — HEMOGLOBIN A1C
HEMOGLOBIN A1C: 7 % — AB (ref <5.7)
Mean Plasma Glucose: 154 mg/dL — ABNORMAL HIGH (ref <117)

## 2015-08-20 LAB — LIPID PANEL
Cholesterol: 97 mg/dL — ABNORMAL LOW (ref 125–200)
HDL: 40 mg/dL (ref >=40)
LDL CALC: 31 mg/dL (ref <130)
TRIGLYCERIDES: 131 mg/dL (ref <150)
Total CHOL/HDL Ratio: 2.4 Ratio (ref <=5.0)
VLDL: 26 mg/dL (ref <30)

## 2015-08-20 LAB — COMPLETE METABOLIC PANEL WITH GFR
ALBUMIN: 4.4 g/dL (ref 3.6–5.1)
ALK PHOS: 51 U/L (ref 40–115)
ALT: 27 U/L (ref 9–46)
AST: 25 U/L (ref 10–35)
BUN: 22 mg/dL (ref 7–25)
CALCIUM: 9.8 mg/dL (ref 8.6–10.3)
CHLORIDE: 104 mmol/L (ref 98–110)
CO2: 23 mmol/L (ref 20–31)
CREATININE: 0.96 mg/dL (ref 0.70–1.25)
GFR, Est African American: >89 mL/min (ref >=60)
GFR, Est Non African American: 83 mL/min (ref >=60)
GLUCOSE: 114 mg/dL — AB (ref 70–99)
POTASSIUM: 4 mmol/L (ref 3.5–5.3)
SODIUM: 141 mmol/L (ref 135–146)
Total Bilirubin: 0.5 mg/dL (ref 0.2–1.2)
Total Protein: 7 g/dL (ref 6.1–8.1)

## 2015-08-20 MED ORDER — METOPROLOL SUCCINATE ER 25 MG PO TB24: ORAL_TABLET | ORAL | Status: DC

## 2015-08-20 NOTE — Telephone Encounter (Signed)
Received fax requesting refill on Metoprolol.   Refill appropriate and filled per protocol. 

## 2015-08-20 NOTE — Progress Notes (Signed)
Subjective:    Patient ID: Ronald Dorsey, male    DOB: 1950-10-15, 64 y.o.   MRN: [id number]  HPI 06/28/14 Patient is here today following up with diabetes. He is currently on Lantus and NovoLog. He also takes metformin 1000 mg by mouth twice a day, and invokana 300 mg poqday.  He states his sugars are ranging in the 150s. He denies any side effects on the medication. He denies any chest pain shortness of breath or dyspnea on exertion. He denies any myalgias or quadrant pain. He is due for a check of his fasting lab work. His diabetic eye exam and diabetic exam are up-to-date.  At that time, my plan was: 1. HLD (hyperlipidemia) Get fasting lipid panel. Goal LDL is less than 100 - Lipid panel  2. HTN (hypertension) Blood pressure is well controlled. Continue current medications at the present dosages. - COMPLETE METABOLIC PANEL WITH GFR  3. Type II or unspecified type diabetes mellitus without mention of complication, not stated as uncontrolled Check hemoglobin A1c.  - Hemoglobin A1c - Microalbumin, urine  02/01/15 He is here today for follow up.  Patient is due for Pneumovax 23. Diabetic foot exam was performed today and is normal. Diabetic eye exam is up-to-date.  Patient states that his fasting blood sugars in the morning are typically 180. His 2 postprandial sugars are typically 120-180. He is currently on Lantus 50 units twice daily and NovoLog 20-40 units with meals depending upon what he is eating. His fasting blood sugars seem to be his biggest issues at the present time. He also reports erectile dysfunction and would like to try 20 mg a day Cialis. In the past he is also use prostaglandin injections which worked well but he would like to try the Cialis first. His blood pressures well controlled. He is due for fasting lipid panel.  At that time, my plan was: Add Actos 30 mg a day as an insulin sensitizer. Check hemoglobin A1c. Goal hemoglobin A1c is less than 7. I will also check a  fasting lipid panel. Goal LDL cholesterol is less than 100. Patient's blood pressure is at goal today and I'll make no changes in his medication without regard. Diabetic eye exam, diabetic foot exam are up-to-date. Patient received Pneumovax 23 today in clinic.  08/20/15 Wt Readings from Last 3 Encounters:  08/20/15 303 lb (137.44 kg)  02/01/15 290 lb (131.543 kg)  08/31/14 297 lb (134.718 kg)   Since the last time I saw this gentleman, he has gained 13 pounds. However he is on a combination of Actos as well as Lyrica. He definitely sees benefit from the Lyrica as far as helping with pain. The Actos has seemingly helped as an insulin sensitizer. For instance his fasting blood sugars typically under 100 and often in the 80s. However he is having some fluid retention and obviously the weight gain. However he admits that he eats excessively at night was when he gets home. He is also relatively sedentary. He denies any chest pain shortness of breath or dyspnea on exertion. He denies any myalgias or right upper quadrant pain. He denies any polyuria, polydipsia, or blurred vision. Diabetic eye exam is up-to-date. Diabetic foot exam is performed today. He denies any other side effects or medical concerns aside from some pain in his right shoulder secondary to some mild arthritis as well as a subacromial bone spur. Past Medical History  Diagnosis Date  . Arthritis   . Hypertension   . Hypercholesterolemia   .  Diabetes mellitus without complication ([place])   . Apnea   . PSVT (paroxysmal supraventricular tachycardia) ([place])   . Neuromuscular disorder [facility])     sciatica   Past Surgical History  Procedure Laterality Date  . Eye surgery    . Tonsillectomy  1961  . Carpal tunnel release  2010    left wrist   Current Outpatient Prescriptions on File Prior to Visit  Medication Sig Dispense Refill  . alprostadil (EDEX) 40 MCG injection 40 mcg by Intracavitary route as needed for erectile dysfunction. use no  more than 3 times per week 1 each 12  . aspirin EC 81 MG tablet Take 162 mg by mouth at bedtime.    . canagliflozin (INVOKANA) 300 MG TABS tablet Take 300 mg by mouth daily. 30 tablet 3  . ezetimibe (ZETIA) 10 MG tablet Take 1 tablet (10 mg total) by mouth daily. 90 tablet 1  . glucose blood (BAYER CONTOUR NEXT TEST) test strip TEST BLOOD SUGAR FOUR TIMES DAILY 125 each 11  . insulin aspart (NOVOLOG) 100 UNIT/ML injection Inject 40 Units into the skin 3 (three) times daily with meals. 30 mL 5  . insulin glargine (LANTUS) 100 UNIT/ML injection 50 units bid 30 mL 3  . INSULIN SYRINGE .5CC/29G 29G X 1/2" 0.5 ML MISC USE AS DIRECTED 100 each 6  . lisinopril (PRINIVIL,ZESTRIL) 20 MG tablet Take 2 tablets (40 mg total) by mouth daily. 180 tablet 3  . meloxicam (MOBIC) 7.5 MG tablet Take 1 tablet (7.5 mg total) by mouth daily as needed. For pain/inflammation 90 tablet 4  . metFORMIN (GLUCOPHAGE) 500 MG tablet Take 2 tablets (1,000 mg total) by mouth 2 (two) times daily. 120 tablet 5  . metoprolol succinate (TOPROL-XL) 25 MG 24 hr tablet TAKE 1 TABLET BY MOUTH EVERY DAY 90 tablet 0  . omega-3 acid ethyl esters (LOVAZA) 1 G capsule Take 2 capsules (2 g total) by mouth 2 (two) times daily. 120 capsule 6  . pioglitazone (ACTOS) 30 MG tablet Take 1 tablet (30 mg total) by mouth daily. 30 tablet 3  . pregabalin (LYRICA) 150 MG capsule Take 1 capsule (150 mg total) by mouth 2 (two) times daily. 60 capsule 5  . rosuvastatin (CRESTOR) 40 MG tablet Take 0.5 tablets (20 mg total) by mouth at bedtime. 30 tablet 6  . tadalafil (CIALIS) 20 MG tablet Take 0.5-1 tablets (10-20 mg total) by mouth every other day as needed for erectile dysfunction. 4 tablet 11  . tadalafil (CIALIS) 5 MG tablet Take 1 tablet (5 mg total) by mouth daily as needed for erectile dysfunction. 30 tablet 3   No current facility-administered medications on file prior to visit.   No Known Allergies Social History   Social History  . Marital  Status: Married    Spouse Name: N/A  . Number of Children: N/A  . Years of Education: N/A   Occupational History  . Not on file.   Social History Main Topics  . Smoking status: Former Research scientist (life sciences)  . Smokeless tobacco: Former Systems developer  . Alcohol Use: 0.0 oz/week     Comment: 1 glass of wine a month or less  . Drug Use: No  . 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 Not on file   Social History Narrative      Review of Systems  All other systems reviewed and are negative.      Objective:  Physical Exam  Constitutional: He appears well-developed and well-nourished.  Neck: Neck supple.  Cardiovascular: Normal rate, regular rhythm and normal heart sounds.   No murmur heard. Pulmonary/Chest: Effort normal and breath sounds normal. No respiratory distress. He has no wheezes. He has no rales.  Abdominal: Soft. Bowel sounds are normal. He exhibits no distension. There is no tenderness. There is no rebound and no guarding.  Lymphadenopathy:    He has no cervical adenopathy.  Vitals reviewed.         Assessment & Plan:  IDDM (insulin dependent diabetes mellitus) ([place]) - Plan: CBC with Differential/Platelet, COMPLETE METABOLIC PANEL WITH GFR, Hemoglobin A1c, Lipid panel, Microalbumin, urine  Essential hypertension  HLD (hyperlipidemia)  His blood pressure is well controlled. I will make no changes in his medication at this time. I will check a fasting lipid panel. Because of his diabetes, his goal hemoglobin A1c is less than 100. His diabetic eye exam and diabetic foot exam is up-to-date. Pneumovax is up-to-date. He is compliant with a daily aspirin. His blood sugars sound well controlled. I will check a hemoglobin A1c. His goal hemoglobin A1c is less than 7. He received his flu shot today. If A1c is much less than 7, I may try to scale back on Actos to 15 mg to help with some weight loss. Offer the patient HIV as well as  hepatitis C screening and he politely declined.

## 2015-08-20 NOTE — Addendum Note (Signed)
Addended by: Shary Decamp B on: 08/20/2015 10:04 AM   Modules accepted: Orders

## 2015-08-22 ENCOUNTER — Encounter: Payer: Self-pay | Admitting: Family Medicine

## 2015-08-28 ENCOUNTER — Encounter: Payer: Self-pay | Admitting: Family Medicine

## 2015-08-28 DIAGNOSIS — E785 Hyperlipidemia, unspecified: Secondary | ICD-10-CM

## 2015-08-29 MED ORDER — INSULIN GLARGINE 100 UNIT/ML ~~LOC~~ SOLN: SUBCUTANEOUS | Status: DC

## 2015-08-29 MED ORDER — ROSUVASTATIN CALCIUM 40 MG PO TABS: 20.0000 mg | ORAL_TABLET | Freq: Every day | ORAL | Status: DC

## 2015-09-02 ENCOUNTER — Other Ambulatory Visit: Payer: Self-pay | Admitting: Family Medicine

## 2015-09-02 MED ORDER — METFORMIN HCL 500 MG PO TABS: 1000.0000 mg | ORAL_TABLET | Freq: Two times a day (BID) | ORAL | Status: DC

## 2015-09-02 NOTE — Telephone Encounter (Signed)
Medication called/sent to requested pharmacy  

## 2015-09-11 ENCOUNTER — Other Ambulatory Visit: Payer: Self-pay | Admitting: Family Medicine

## 2015-09-11 MED ORDER — EZETIMIBE 10 MG PO TABS: 10.0000 mg | ORAL_TABLET | Freq: Every day | ORAL | Status: DC

## 2015-09-18 ENCOUNTER — Telehealth: Payer: Self-pay | Admitting: Family Medicine

## 2015-09-18 MED ORDER — INSULIN ASPART 100 UNIT/ML ~~LOC~~ SOLN: 40.0000 [IU] | Freq: Three times a day (TID) | SUBCUTANEOUS | Status: DC

## 2015-09-18 NOTE — Telephone Encounter (Signed)
Medication called/sent to requested pharmacy  

## 2015-09-24 ENCOUNTER — Other Ambulatory Visit: Payer: Self-pay | Admitting: Family Medicine

## 2015-09-24 DIAGNOSIS — Z794 Long term (current) use of insulin: Principal | ICD-10-CM

## 2015-09-24 DIAGNOSIS — E119 Type 2 diabetes mellitus without complications: Secondary | ICD-10-CM

## 2015-09-24 MED ORDER — PIOGLITAZONE HCL 30 MG PO TABS: 30.0000 mg | ORAL_TABLET | Freq: Every day | ORAL | Status: DC

## 2015-09-24 NOTE — Telephone Encounter (Signed)
Medication refilled per protocol. 

## 2015-10-18 ENCOUNTER — Other Ambulatory Visit: Payer: Self-pay | Admitting: Family Medicine

## 2015-10-18 DIAGNOSIS — G709 Myoneural disorder, unspecified: Secondary | ICD-10-CM

## 2015-10-18 MED ORDER — PREGABALIN 150 MG PO CAPS: 150.0000 mg | ORAL_CAPSULE | Freq: Two times a day (BID) | ORAL | Status: DC

## 2015-10-18 NOTE — Telephone Encounter (Signed)
rx called in

## 2015-10-18 NOTE — Telephone Encounter (Signed)
LRF 04/12/15 #[phone] + [phone]  LOV 08/20/15  OK refill?

## 2015-10-18 NOTE — Telephone Encounter (Signed)
ok 

## 2015-10-22 ENCOUNTER — Other Ambulatory Visit: Payer: Self-pay | Admitting: Family Medicine

## 2015-10-22 MED ORDER — CANAGLIFLOZIN 300 MG PO TABS: 300.0000 mg | ORAL_TABLET | Freq: Every day | ORAL | Status: DC

## 2015-10-22 NOTE — Telephone Encounter (Signed)
Medication called/sent to requested pharmacy  

## 2015-11-25 ENCOUNTER — Other Ambulatory Visit: Payer: Self-pay | Admitting: Family Medicine

## 2015-11-25 MED ORDER — OMEGA-3-ACID ETHYL ESTERS 1 G PO CAPS: 2.0000 | ORAL_CAPSULE | Freq: Two times a day (BID) | ORAL | Status: DC

## 2015-11-25 NOTE — Telephone Encounter (Signed)
Medication called/sent to requested pharmacy  

## 2016-01-15 ENCOUNTER — Other Ambulatory Visit: Payer: Self-pay | Admitting: Family Medicine

## 2016-01-15 MED ORDER — MELOXICAM 7.5 MG PO TABS: 7.5000 mg | ORAL_TABLET | Freq: Every day | ORAL | Status: DC | PRN

## 2016-01-15 MED ORDER — LISINOPRIL 20 MG PO TABS: 40.0000 mg | ORAL_TABLET | Freq: Every day | ORAL | Status: DC

## 2016-01-15 MED ORDER — INSULIN GLARGINE 100 UNIT/ML ~~LOC~~ SOLN: SUBCUTANEOUS | Status: DC

## 2016-01-15 NOTE — Telephone Encounter (Signed)
Medication called/sent to requested pharmacy  

## 2016-01-27 ENCOUNTER — Encounter: Payer: Self-pay | Admitting: Family Medicine

## 2016-01-28 ENCOUNTER — Telehealth: Payer: Self-pay

## 2016-01-28 NOTE — Telephone Encounter (Signed)
BCBS needed additional information to approve patients novolog rx.  Diagnosis code and history of other medications given.

## 2016-02-10 ENCOUNTER — Encounter: Payer: Self-pay | Admitting: Family Medicine

## 2016-03-14 ENCOUNTER — Encounter: Payer: Self-pay | Admitting: Family Medicine

## 2016-03-16 MED ORDER — INSULIN LISPRO 100 UNIT/ML ~~LOC~~ SOLN: 40.0000 [IU] | Freq: Three times a day (TID) | SUBCUTANEOUS | 11 refills | Status: DC

## 2016-03-16 NOTE — Telephone Encounter (Signed)
Medication called/sent to requested pharmacy  

## 2016-03-17 ENCOUNTER — Other Ambulatory Visit: Payer: Self-pay | Admitting: Family Medicine

## 2016-03-17 DIAGNOSIS — E119 Type 2 diabetes mellitus without complications: Secondary | ICD-10-CM

## 2016-03-17 DIAGNOSIS — Z794 Long term (current) use of insulin: Principal | ICD-10-CM

## 2016-03-17 MED ORDER — PIOGLITAZONE HCL 30 MG PO TABS: 30.0000 mg | ORAL_TABLET | Freq: Every day | ORAL | 1 refills | Status: DC

## 2016-03-17 MED ORDER — EZETIMIBE 10 MG PO TABS: 10.0000 mg | ORAL_TABLET | Freq: Every day | ORAL | 1 refills | Status: DC

## 2016-03-17 NOTE — Telephone Encounter (Signed)
Medication called/sent to requested pharmacy  

## 2016-04-06 ENCOUNTER — Ambulatory Visit: Payer: BLUE CROSS/BLUE SHIELD | Admitting: Family Medicine

## 2016-04-09 ENCOUNTER — Ambulatory Visit: Payer: BLUE CROSS/BLUE SHIELD | Admitting: Family Medicine

## 2016-04-11 ENCOUNTER — Other Ambulatory Visit: Payer: Self-pay | Admitting: Family Medicine

## 2016-04-13 NOTE — Telephone Encounter (Signed)
Refill appropriate and filled per protocol. 

## 2016-04-23 ENCOUNTER — Encounter: Payer: Self-pay | Admitting: Family Medicine

## 2016-04-23 ENCOUNTER — Ambulatory Visit (INDEPENDENT_AMBULATORY_CARE_PROVIDER_SITE_OTHER): Payer: BLUE CROSS/BLUE SHIELD | Admitting: Family Medicine

## 2016-04-23 VITALS — BP 130/76 | HR 84 | Temp 98.6°F | Resp 16 | Ht 73.0 in | Wt 304.0 lb

## 2016-04-23 DIAGNOSIS — E119 Type 2 diabetes mellitus without complications: Secondary | ICD-10-CM

## 2016-04-23 DIAGNOSIS — Z23 Encounter for immunization: Secondary | ICD-10-CM | POA: Diagnosis not present

## 2016-04-23 DIAGNOSIS — Z794 Long term (current) use of insulin: Secondary | ICD-10-CM | POA: Diagnosis not present

## 2016-04-23 DIAGNOSIS — IMO0001 Reserved for inherently not codable concepts without codable children: Secondary | ICD-10-CM

## 2016-04-23 NOTE — Addendum Note (Signed)
Addended by: Sheral Flow on: 04/23/2016 10:13 AM   Modules accepted: Orders

## 2016-04-23 NOTE — Progress Notes (Signed)
Subjective:    Patient ID: Ronald Dorsey, male    DOB: 1950/08/30, 65 y.o.   MRN: [id number]  HPI11/5/15 Patient is here today following up with diabetes. He is currently on Lantus and NovoLog. He also takes metformin 1000 mg by mouth twice a day, and invokana 300 mg poqday.  He states his sugars are ranging in the 150s. He denies any side effects on the medication. He denies any chest pain shortness of breath or dyspnea on exertion. He denies any myalgias or quadrant pain. He is due for a check of his fasting lab work. His diabetic eye exam and diabetic exam are up-to-date.  At that time, my plan was: 1. HLD (hyperlipidemia) Get fasting lipid panel. Goal LDL is less than 100 - Lipid panel  2. HTN (hypertension) Blood pressure is well controlled. Continue current medications at the present dosages. - COMPLETE METABOLIC PANEL WITH GFR  3. Type II or unspecified type diabetes mellitus without mention of complication, not stated as uncontrolled Check hemoglobin A1c.  - Hemoglobin A1c - Microalbumin, urine  02/01/15 He is here today for follow up.  Patient is due for Pneumovax 23. Diabetic foot exam was performed today and is normal. Diabetic eye exam is up-to-date.  Patient states that his fasting blood sugars in the morning are typically 180. His 2 postprandial sugars are typically 120-180. He is currently on Lantus 50 units twice daily and NovoLog 20-40 units with meals depending upon what he is eating. His fasting blood sugars seem to be his biggest issues at the present time. He also reports erectile dysfunction and would like to try 20 mg a day Cialis. In the past he is also use prostaglandin injections which worked well but he would like to try the Cialis first. His blood pressures well controlled. He is due for fasting lipid panel.  At that time, my plan was: Add Actos 30 mg a day as an insulin sensitizer. Check hemoglobin A1c. Goal hemoglobin A1c is less than 7. I will also check a  fasting lipid panel. Goal LDL cholesterol is less than 100. Patient's blood pressure is at goal today and I'll make no changes in his medication without regard. Diabetic eye exam, diabetic foot exam are up-to-date. Patient received Pneumovax 23 today in clinic.  08/20/15 Wt Readings from Last 3 Encounters:  04/23/16 (!) 304 lb (137.9 kg)  08/20/15 (!) 303 lb (137.4 kg)  02/01/15 290 lb (131.5 kg)   Since the last time I saw this gentleman, he has gained 13 pounds. However he is on a combination of Actos as well as Lyrica. He definitely sees benefit from the Lyrica as far as helping with pain. The Actos has seemingly helped as an insulin sensitizer. For instance his fasting blood sugars typically under 100 and often in the 80s. However he is having some fluid retention and obviously the weight gain. However he admits that he eats excessively at night was when he gets home. He is also relatively sedentary. He denies any chest pain shortness of breath or dyspnea on exertion. He denies any myalgias or right upper quadrant pain. He denies any polyuria, polydipsia, or blurred vision. Diabetic eye exam is up-to-date. Diabetic foot exam is performed today. He denies any other side effects or medical concerns aside from some pain in his right shoulder secondary to some mild arthritis as well as a subacromial bone spur.  At that time, my plan was:  His blood pressure is well controlled. I will make  no changes in his medication at this time. I will check a fasting lipid panel. Because of his diabetes, his goal hemoglobin A1c is less than 100. His diabetic eye exam and diabetic foot exam is up-to-date. Pneumovax is up-to-date. He is compliant with a daily aspirin. His blood sugars sound well controlled. I will check a hemoglobin A1c. His goal hemoglobin A1c is less than 7. He received his flu shot today. If A1c is much less than 7, I may try to scale back on Actos to 15 mg to help with some weight loss. Offer the  patient HIV as well as hepatitis C screening and he politely declined.  04/23/16 He continues to gain weight. Unfortunately, the benefit we originally saw from invokana has been lost. He admits that he is a stress eater and he has been under more stress recently building a house. He states that his fasting blood sugars are typically around 120. His two-hour postprandial sugars however can be higher near 170-180. He denies any chest pain shortness of breath or dyspnea on exertion. He denies any myalgias or right upper quadrant pain. He denies any polyuria, polydipsia, or blurred vision. He is requesting a flu shot. Past Medical History:  Diagnosis Date  . Apnea   . Arthritis   . Diabetes mellitus without complication ([place])   . Hypercholesterolemia   . Hypertension   . Neuromuscular disorder ([facility])    sciatica  . PSVT (paroxysmal supraventricular tachycardia) ([place])    Past Surgical History:  Procedure Laterality Date  . CARPAL TUNNEL RELEASE  2010   left wrist  . EYE SURGERY    . TONSILLECTOMY  1961   Current Outpatient Prescriptions on File Prior to Visit  Medication Sig Dispense Refill  . aspirin EC 81 MG tablet Take 162 mg by mouth at bedtime.    . canagliflozin (INVOKANA) 300 MG TABS tablet Take 1 tablet (300 mg total) by mouth daily. 30 tablet 3  . ezetimibe (ZETIA) 10 MG tablet Take 1 tablet (10 mg total) by mouth daily. 90 tablet 1  . glucose blood (BAYER CONTOUR NEXT TEST) test strip TEST BLOOD SUGAR FOUR TIMES DAILY 125 each 11  . insulin aspart (NOVOLOG) 100 UNIT/ML injection Inject 40 Units into the skin 3 (three) times daily with meals. 30 mL 5  . insulin glargine (LANTUS) 100 UNIT/ML injection 50 units bid 30 mL 3  . INSULIN SYRINGE .5CC/29G 29G X 1/2" 0.5 ML MISC USE AS DIRECTED 100 each 6  . lisinopril (PRINIVIL,ZESTRIL) 20 MG tablet Take 2 tablets (40 mg total) by mouth daily. 180 tablet 3  . meloxicam (MOBIC) 7.5 MG tablet Take 1 tablet (7.5 mg total) by mouth daily as  needed. For pain/inflammation 90 tablet 4  . metFORMIN (GLUCOPHAGE) 500 MG tablet take 2 tablets by mouth twice a day 120 tablet 5  . metoprolol succinate (TOPROL-XL) 25 MG 24 hr tablet TAKE 1 TABLET BY MOUTH EVERY DAY 90 tablet 3  . omega-3 acid ethyl esters (LOVAZA) 1 g capsule Take 2 capsules (2 g total) by mouth 2 (two) times daily. 120 capsule 11  . pioglitazone (ACTOS) 30 MG tablet Take 1 tablet (30 mg total) by mouth daily. 90 tablet 1  . pregabalin (LYRICA) 150 MG capsule Take 1 capsule (150 mg total) by mouth 2 (two) times daily. 60 capsule 5  . rosuvastatin (CRESTOR) 40 MG tablet Take 0.5 tablets (20 mg total) by mouth at bedtime. 30 tablet 5  . tadalafil (CIALIS) 20 MG tablet  Take 0.5-1 tablets (10-20 mg total) by mouth every other day as needed for erectile dysfunction. 4 tablet 11   No current facility-administered medications on file prior to visit.    No Known Allergies Social History   Social History  . Marital status: Married    Spouse name: N/A  . Number of children: N/A  . Years of education: N/A   Occupational History  . Not on file.   Social History Main Topics  . Smoking status: Former Research scientist (life sciences)  . Smokeless tobacco: Former Systems developer  . Alcohol use 0.0 oz/week     Comment: 1 glass of wine a month or less  . Drug use: No  . 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 Not on file   Social History Narrative  . No narrative on file      Review of Systems  All other systems reviewed and are negative.      Objective:   Physical Exam  Constitutional: He appears well-developed and well-nourished.  Neck: Neck supple.  Cardiovascular: Normal rate, regular rhythm and normal heart sounds.   No murmur heard. Pulmonary/Chest: Effort normal and breath sounds normal. No respiratory distress. He has no wheezes. He has no rales.  Abdominal: Soft. Bowel sounds are normal. He exhibits no distension. There is no  tenderness. There is no rebound and no guarding.  Lymphadenopathy:    He has no cervical adenopathy.  Vitals reviewed.         Assessment & Plan:  Diabetes mellitus, insulin dependent (IDDM), controlled ([place]) - Plan: COMPLETE METABOLIC PANEL WITH GFR, CBC with Differential/Platelet, Lipid panel, Hemoglobin A1c, Microalbumin, urine I will check a fasting lipid panel. Goal LDL cholesterol is less than 100. Patient received his flu shot today. I will check a hemoglobin A1c. However I'm very concerned about his weight. I recommended discontinuing invokana and adding trulicity can assist in further weight reduction. The patient is certainly interested in putting on his lab work reveals.

## 2016-04-24 ENCOUNTER — Other Ambulatory Visit: Payer: Self-pay | Admitting: *Deleted

## 2016-04-24 LAB — CBC WITH DIFFERENTIAL/PLATELET
Basophils Absolute: 0 cells/uL (ref 0–200)
Basophils Relative: 0 %
Eosinophils Absolute: 304 cells/uL (ref 15–500)
Eosinophils Relative: 4 %
HEMATOCRIT: 48 % (ref 38.5–50.0)
Hemoglobin: 15.9 g/dL (ref 13.0–17.0)
LYMPHS PCT: 28 %
Lymphs Abs: 2128 cells/uL (ref 850–3900)
MCH: 30.1 pg (ref 27.0–33.0)
MCHC: 33.1 g/dL (ref 32.0–36.0)
MCV: 90.9 fL (ref 80.0–100.0)
MONO ABS: 1064 {cells}/uL — AB (ref 200–950)
MPV: 10.8 fL (ref 7.5–12.5)
Monocytes Relative: 14 %
NEUTROS PCT: 54 %
Neutro Abs: 4104 cells/uL (ref 1500–7800)
Platelets: 135 10*3/uL — ABNORMAL LOW (ref 140–400)
RBC: 5.28 MIL/uL (ref 4.20–5.80)
RDW: 14.6 % (ref 11.0–15.0)
WBC: 7.6 10*3/uL (ref 3.8–10.8)

## 2016-04-24 LAB — COMPLETE METABOLIC PANEL WITH GFR
ALBUMIN: 4.6 g/dL (ref 3.6–5.1)
ALK PHOS: 49 U/L (ref 40–115)
ALT: 34 U/L (ref 9–46)
AST: 33 U/L (ref 10–35)
BUN: 28 mg/dL — AB (ref 7–25)
CALCIUM: 9.9 mg/dL (ref 8.6–10.3)
CO2: 26 mmol/L (ref 20–31)
CREATININE: 1.02 mg/dL (ref 0.70–1.25)
Chloride: 105 mmol/L (ref 98–110)
GFR, Est African American: 89 mL/min (ref >=60)
GFR, Est Non African American: 77 mL/min (ref >=60)
Glucose, Bld: 100 mg/dL — ABNORMAL HIGH (ref 70–99)
Potassium: 4.7 mmol/L (ref 3.5–5.3)
Sodium: 142 mmol/L (ref 135–146)
Total Bilirubin: 0.6 mg/dL (ref 0.2–1.2)
Total Protein: 6.9 g/dL (ref 6.1–8.1)

## 2016-04-24 LAB — LIPID PANEL
CHOL/HDL RATIO: 2.3 ratio (ref <=5.0)
CHOLESTEROL: 87 mg/dL — AB (ref 125–200)
HDL: 38 mg/dL — AB (ref >=40)
LDL Cholesterol: 27 mg/dL (ref <130)
Triglycerides: 111 mg/dL (ref <150)
VLDL: 22 mg/dL (ref <30)

## 2016-04-24 LAB — HEMOGLOBIN A1C
HEMOGLOBIN A1C: 7.5 % — AB (ref <5.7)
Mean Plasma Glucose: 169 mg/dL

## 2016-04-24 LAB — MICROALBUMIN, URINE: Microalb, Ur: 0.6 mg/dL

## 2016-04-24 MED ORDER — DULAGLUTIDE 1.5 MG/0.5ML ~~LOC~~ SOAJ: 1.5000 mg | SUBCUTANEOUS | 3 refills | Status: DC

## 2016-04-28 ENCOUNTER — Encounter: Payer: Self-pay | Admitting: Family Medicine

## 2016-04-28 MED ORDER — LIRAGLUTIDE 18 MG/3ML ~~LOC~~ SOPN: PEN_INJECTOR | SUBCUTANEOUS | 3 refills | Status: DC

## 2016-04-28 NOTE — Telephone Encounter (Signed)
Insurance will not cover Trulicity and per Dr. Dennard Schaumann switch to Victoza - med sent to pharm and pt aware via mychart

## 2016-05-07 ENCOUNTER — Encounter: Payer: Self-pay | Admitting: Family Medicine

## 2016-05-08 ENCOUNTER — Other Ambulatory Visit: Payer: Self-pay | Admitting: Family Medicine

## 2016-05-08 DIAGNOSIS — G709 Myoneural disorder, unspecified: Secondary | ICD-10-CM

## 2016-05-08 MED ORDER — INSULIN PEN NEEDLE 31G X 8 MM MISC: 5 refills | Status: DC

## 2016-05-08 NOTE — Telephone Encounter (Signed)
rx for needles sent to pharm

## 2016-05-11 ENCOUNTER — Other Ambulatory Visit: Payer: Self-pay | Admitting: Family Medicine

## 2016-05-14 ENCOUNTER — Other Ambulatory Visit: Payer: Self-pay | Admitting: *Deleted

## 2016-05-14 DIAGNOSIS — G709 Myoneural disorder, unspecified: Secondary | ICD-10-CM

## 2016-06-07 ENCOUNTER — Other Ambulatory Visit: Payer: Self-pay | Admitting: Family Medicine

## 2016-06-07 DIAGNOSIS — G709 Myoneural disorder, unspecified: Secondary | ICD-10-CM

## 2016-07-12 ENCOUNTER — Other Ambulatory Visit: Payer: Self-pay | Admitting: Family Medicine

## 2016-08-21 ENCOUNTER — Other Ambulatory Visit: Payer: Self-pay | Admitting: Family Medicine

## 2016-08-21 DIAGNOSIS — E119 Type 2 diabetes mellitus without complications: Secondary | ICD-10-CM

## 2016-08-21 DIAGNOSIS — Z794 Long term (current) use of insulin: Principal | ICD-10-CM

## 2016-09-20 ENCOUNTER — Other Ambulatory Visit: Payer: Self-pay | Admitting: Family Medicine

## 2016-09-20 DIAGNOSIS — E785 Hyperlipidemia, unspecified: Secondary | ICD-10-CM

## 2016-10-19 ENCOUNTER — Other Ambulatory Visit: Payer: Self-pay | Admitting: Family Medicine

## 2016-11-17 DIAGNOSIS — Z0000 Encounter for general adult medical examination without abnormal findings: Secondary | ICD-10-CM | POA: Diagnosis not present

## 2016-12-06 ENCOUNTER — Other Ambulatory Visit: Payer: Self-pay | Admitting: Family Medicine

## 2016-12-11 ENCOUNTER — Ambulatory Visit: Payer: Self-pay | Admitting: Family Medicine

## 2017-01-01 ENCOUNTER — Ambulatory Visit (INDEPENDENT_AMBULATORY_CARE_PROVIDER_SITE_OTHER): Payer: Medicare Other | Admitting: Family Medicine

## 2017-01-01 ENCOUNTER — Encounter: Payer: Self-pay | Admitting: Family Medicine

## 2017-01-01 VITALS — BP 160/78 | HR 74 | Temp 97.6°F | Resp 18 | Ht 73.0 in | Wt 302.0 lb

## 2017-01-01 DIAGNOSIS — E7800 Pure hypercholesterolemia, unspecified: Secondary | ICD-10-CM | POA: Diagnosis not present

## 2017-01-01 DIAGNOSIS — Z794 Long term (current) use of insulin: Secondary | ICD-10-CM | POA: Diagnosis not present

## 2017-01-01 DIAGNOSIS — E119 Type 2 diabetes mellitus without complications: Secondary | ICD-10-CM | POA: Diagnosis not present

## 2017-01-01 DIAGNOSIS — I10 Essential (primary) hypertension: Secondary | ICD-10-CM

## 2017-01-01 DIAGNOSIS — IMO0001 Reserved for inherently not codable concepts without codable children: Secondary | ICD-10-CM

## 2017-01-01 LAB — COMPREHENSIVE METABOLIC PANEL
ALK PHOS: 59 U/L (ref 40–115)
ALT: 21 U/L (ref 9–46)
AST: 25 U/L (ref 10–35)
Albumin: 4.5 g/dL (ref 3.6–5.1)
BILIRUBIN TOTAL: 0.6 mg/dL (ref 0.2–1.2)
BUN: 17 mg/dL (ref 7–25)
CO2: 25 mmol/L (ref 20–31)
Calcium: 9.7 mg/dL (ref 8.6–10.3)
Chloride: 103 mmol/L (ref 98–110)
Creat: 0.87 mg/dL (ref 0.70–1.25)
GLUCOSE: 96 mg/dL (ref 70–99)
Potassium: 4.4 mmol/L (ref 3.5–5.3)
Sodium: 139 mmol/L (ref 135–146)
Total Protein: 6.7 g/dL (ref 6.1–8.1)

## 2017-01-01 LAB — CBC WITH DIFFERENTIAL/PLATELET
BASOS ABS: 74 {cells}/uL (ref 0–200)
BASOS PCT: 1 %
Eosinophils Absolute: 222 cells/uL (ref 15–500)
Eosinophils Relative: 3 %
HCT: 46.8 % (ref 38.5–50.0)
Hemoglobin: 15.5 g/dL (ref 13.0–17.0)
LYMPHS PCT: 26 %
Lymphs Abs: 1924 cells/uL (ref 850–3900)
MCH: 30 pg (ref 27.0–33.0)
MCHC: 33.1 g/dL (ref 32.0–36.0)
MCV: 90.5 fL (ref 80.0–100.0)
MONO ABS: 666 {cells}/uL (ref 200–950)
MPV: 10.4 fL (ref 7.5–12.5)
Monocytes Relative: 9 %
Neutro Abs: 4514 cells/uL (ref 1500–7800)
Neutrophils Relative %: 61 %
Platelets: 151 10*3/uL (ref 140–400)
RBC: 5.17 MIL/uL (ref 4.20–5.80)
RDW: 14 % (ref 11.0–15.0)
WBC: 7.4 10*3/uL (ref 3.8–10.8)

## 2017-01-01 LAB — LIPID PANEL
Cholesterol: 91 mg/dL (ref <200)
HDL: 40 mg/dL — ABNORMAL LOW (ref >40)
LDL Cholesterol: 34 mg/dL (ref <100)
Total CHOL/HDL Ratio: 2.3 Ratio (ref <5.0)
Triglycerides: 87 mg/dL (ref <150)
VLDL: 17 mg/dL (ref <30)

## 2017-01-01 MED ORDER — AMLODIPINE BESYLATE 10 MG PO TABS: 10.0000 mg | ORAL_TABLET | Freq: Every day | ORAL | 3 refills | Status: DC

## 2017-01-01 NOTE — Progress Notes (Signed)
Subjective:    Patient ID: Ronald Dorsey, male    DOB: 1950/08/30, 66 y.o.   MRN: [id number]  HPI11/5/15 Patient is here today following up with diabetes. He is currently on Lantus and NovoLog. He also takes metformin 1000 mg by mouth twice a day, and invokana 300 mg poqday.  He states his sugars are ranging in the 150s. He denies any side effects on the medication. He denies any chest pain shortness of breath or dyspnea on exertion. He denies any myalgias or quadrant pain. He is due for a check of his fasting lab work. His diabetic eye exam and diabetic exam are up-to-date.  At that time, my plan was: 1. HLD (hyperlipidemia) Get fasting lipid panel. Goal LDL is less than 100 - Lipid panel  2. HTN (hypertension) Blood pressure is well controlled. Continue current medications at the present dosages. - COMPLETE METABOLIC PANEL WITH GFR  3. Type II or unspecified type diabetes mellitus without mention of complication, not stated as uncontrolled Check hemoglobin A1c.  - Hemoglobin A1c - Microalbumin, urine  02/01/15 He is here today for follow up.  Patient is due for Pneumovax 23. Diabetic foot exam was performed today and is normal. Diabetic eye exam is up-to-date.  Patient states that his fasting blood sugars in the morning are typically 180. His 2 postprandial sugars are typically 120-180. He is currently on Lantus 50 units twice daily and NovoLog 20-40 units with meals depending upon what he is eating. His fasting blood sugars seem to be his biggest issues at the present time. He also reports erectile dysfunction and would like to try 20 mg a day Cialis. In the past he is also use prostaglandin injections which worked well but he would like to try the Cialis first. His blood pressures well controlled. He is due for fasting lipid panel.  At that time, my plan was: Add Actos 30 mg a day as an insulin sensitizer. Check hemoglobin A1c. Goal hemoglobin A1c is less than 7. I will also check a  fasting lipid panel. Goal LDL cholesterol is less than 100. Patient's blood pressure is at goal today and I'll make no changes in his medication without regard. Diabetic eye exam, diabetic foot exam are up-to-date. Patient received Pneumovax 23 today in clinic.  08/20/15 Wt Readings from Last 3 Encounters:  04/23/16 (!) 304 lb (137.9 kg)  08/20/15 (!) 303 lb (137.4 kg)  02/01/15 290 lb (131.5 kg)   Since the last time I saw this gentleman, he has gained 13 pounds. However he is on a combination of Actos as well as Lyrica. He definitely sees benefit from the Lyrica as far as helping with pain. The Actos has seemingly helped as an insulin sensitizer. For instance his fasting blood sugars typically under 100 and often in the 80s. However he is having some fluid retention and obviously the weight gain. However he admits that he eats excessively at night was when he gets home. He is also relatively sedentary. He denies any chest pain shortness of breath or dyspnea on exertion. He denies any myalgias or right upper quadrant pain. He denies any polyuria, polydipsia, or blurred vision. Diabetic eye exam is up-to-date. Diabetic foot exam is performed today. He denies any other side effects or medical concerns aside from some pain in his right shoulder secondary to some mild arthritis as well as a subacromial bone spur.  At that time, my plan was:  His blood pressure is well controlled. I will make  no changes in his medication at this time. I will check a fasting lipid panel. Because of his diabetes, his goal hemoglobin A1c is less than 100. His diabetic eye exam and diabetic foot exam is up-to-date. Pneumovax is up-to-date. He is compliant with a daily aspirin. His blood sugars sound well controlled. I will check a hemoglobin A1c. His goal hemoglobin A1c is less than 7. He received his flu shot today. If A1c is much less than 7, I may try to scale back on Actos to 15 mg to help with some weight loss. Offer the  patient HIV as well as hepatitis C screening and he politely declined.  04/23/16 He continues to gain weight. Unfortunately, the benefit we originally saw from invokana has been lost. He admits that he is a stress eater and he has been under more stress recently building a house. He states that his fasting blood sugars are typically around 120. His two-hour postprandial sugars however can be higher near 170-180. He denies any chest pain shortness of breath or dyspnea on exertion. He denies any myalgias or right upper quadrant pain. He denies any polyuria, polydipsia, or blurred vision. He is requesting a flu shot.  At that time, my plan was: I will check a fasting lipid panel. Goal LDL cholesterol is less than 100. Patient received his flu shot today. I will check a hemoglobin A1c. However I'm very concerned about his weight. I recommended discontinuing invokana and adding trulicity can assist in further weight reduction. The patient is certainly interested depending on what his lab work reveals.  01/01/17 A1c was 7.5 last time.  I recommended stopping invokana and trying trulicity.  Ultimately had to use victoza due to insurance. Wt Readings from Last 3 Encounters:  01/01/17 (!) 302 lb (137 kg)  04/23/16 (!) 304 lb (137.9 kg)  08/20/15 (!) 303 lb (137.4 kg)   His weight has not changed significantly.  Due for prevnar 13, hep c and hiv testing.  Eye exam is also due.  Patient admits that he still eating too much. He is not making any attempt to monitor his diet. His blood pressure is also significantly elevated today. He declines hepatitis C. He declines HIV testing. He would like to schedule a complete physical exam in 3 months and at that time we can update his immunizations. Diabetic foot exam was performed today was normal. Past Medical History:  Diagnosis Date  . Apnea   . Arthritis   . Diabetes mellitus without complication ([place])   . Hypercholesterolemia   . Hypertension   . Neuromuscular  disorder ([facility])    sciatica  . PSVT (paroxysmal supraventricular tachycardia) ([place])    Past Surgical History:  Procedure Laterality Date  . CARPAL TUNNEL RELEASE  2010   left wrist  . EYE SURGERY    . TONSILLECTOMY  1961   Current Outpatient Prescriptions on File Prior to Visit  Medication Sig Dispense Refill  . aspirin EC 81 MG tablet Take 162 mg by mouth at bedtime.    . B-D INS SYRINGE 0.5CC/30GX1/2" 30G X 1/2" 0.5 ML MISC USE WITH INSULIN FIVE TIMES A DAY AS DIRECTED 100 each 6  . canagliflozin (INVOKANA) 300 MG TABS tablet Take 1 tablet (300 mg total) by mouth daily. 30 tablet 3  . CIALIS 20 MG tablet TAKE 1/2 TO 1 TABLET BY MOUTH EVERY OTHER DAY AS NEEDED FOR ERETILE DYSFUNCTION 4 tablet 11  . Dulaglutide (TRULICITY) 1.5 XA/1.2IN SOPN Inject 1.5 mg into the skin  once a week. 4 pen 3  . ezetimibe (ZETIA) 10 MG tablet take 1 tablet by mouth once daily 90 tablet 1  . glucose blood (BAYER CONTOUR NEXT TEST) test strip TEST BLOOD SUGAR FOUR TIMES DAILY 125 each 11  . insulin aspart (NOVOLOG) 100 UNIT/ML injection Inject 40 Units into the skin 3 (three) times daily with meals. 30 mL 5  . Insulin Pen Needle 31G X 8 MM MISC Use 1 daily 100 each 5  . LANTUS 100 UNIT/ML injection INJECT 50 UNITS SUBCUTANEOUSLY TWICE A DAY 30 mL 3  . lisinopril (PRINIVIL,ZESTRIL) 20 MG tablet take 2 tablets by mouth daily 180 tablet 3  . LYRICA 150 MG capsule TAKE 1 CAPSULE BY MOUTH 2 TIMES DAILY 60 capsule 5  . meloxicam (MOBIC) 7.5 MG tablet Take 1 tablet (7.5 mg total) by mouth daily as needed. For pain/inflammation 90 tablet 4  . metFORMIN (GLUCOPHAGE) 500 MG tablet take 2 tablets by mouth twice a day 120 tablet 5  . metoprolol succinate (TOPROL-XL) 25 MG 24 hr tablet take 1 tablet by mouth once daily 90 tablet 3  . omega-3 acid ethyl esters (LOVAZA) 1 g capsule TAKE 2 CAPSULES BY MOUTH 2 TIMES DAILY 120 capsule 11  . pioglitazone (ACTOS) 30 MG tablet take 1 tablet by mouth once daily 90 tablet 1  .  rosuvastatin (CRESTOR) 40 MG tablet TAKE 1/2 TABLET BY MOUTH AT BEDTIME 30 tablet 5  . VICTOZA 18 MG/3ML SOPN INJECT 0.6 MILLIGRAMS SUBCUTANEOUSLY DAILY FOR 1 WEEK THEN INCREASE TO 1.2 MILLIGRAMS DAILY 6 mL 0   No current facility-administered medications on file prior to visit.    No Known Allergies Social History   Social History  . Marital status: Married    Spouse name: N/A  . Number of children: N/A  . Years of education: N/A   Occupational History  . Not on file.   Social History Main Topics  . Smoking status: Former Research scientist (life sciences)  . Smokeless tobacco: Former Systems developer  . Alcohol use 0.0 oz/week     Comment: 1 glass of wine a month or less  . Drug use: No  . 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 Not on file   Social History Narrative  . No narrative on file      Review of Systems  All other systems reviewed and are negative.      Objective:   Physical Exam  Constitutional: He appears well-developed and well-nourished.  Neck: Neck supple.  Cardiovascular: Normal rate, regular rhythm and normal heart sounds.   No murmur heard. Pulmonary/Chest: Effort normal and breath sounds normal. No respiratory distress. He has no wheezes. He has no rales.  Abdominal: Soft. Bowel sounds are normal. He exhibits no distension. There is no tenderness. There is no rebound and no guarding.  Lymphadenopathy:    He has no cervical adenopathy.  Vitals reviewed.         Assessment & Plan:  Diabetes mellitus, insulin dependent (IDDM), controlled ([place])  Pure hypercholesterolemia  Essential hypertension Blood pressures elevated. Begin amlodipine 10 mg a day and recheck blood pressure at his physical exam in 3 months. Recommended Prevnar 13. He will receive that his physical exam. Also recommended annual diabetic eye exam. He states that he will schedule this on his own. Check a hemoglobin A1c. Goal hemoglobin A1c is less than 7. I  challenged the patient to try to lose 30  pounds in the next 6 months. He must work on diet and exercise. Recheck in 3 months

## 2017-01-02 LAB — HEMOGLOBIN A1C
Hgb A1c MFr Bld: 7.4 % — ABNORMAL HIGH (ref <5.7)
MEAN PLASMA GLUCOSE: 166 mg/dL

## 2017-01-04 ENCOUNTER — Encounter: Payer: Self-pay | Admitting: Family Medicine

## 2017-02-02 ENCOUNTER — Other Ambulatory Visit: Payer: Self-pay | Admitting: Family Medicine

## 2017-02-02 DIAGNOSIS — G709 Myoneural disorder, unspecified: Secondary | ICD-10-CM

## 2017-02-02 NOTE — Telephone Encounter (Signed)
Ok to refill Lyrica?

## 2017-02-04 NOTE — Telephone Encounter (Signed)
ok 

## 2017-02-17 ENCOUNTER — Ambulatory Visit (INDEPENDENT_AMBULATORY_CARE_PROVIDER_SITE_OTHER): Payer: Medicare Other | Admitting: Family Medicine

## 2017-02-17 DIAGNOSIS — S81812A Laceration without foreign body, left lower leg, initial encounter: Secondary | ICD-10-CM | POA: Diagnosis not present

## 2017-02-17 DIAGNOSIS — Z23 Encounter for immunization: Secondary | ICD-10-CM

## 2017-03-02 ENCOUNTER — Other Ambulatory Visit: Payer: Self-pay | Admitting: Family Medicine

## 2017-03-02 DIAGNOSIS — Z794 Long term (current) use of insulin: Principal | ICD-10-CM

## 2017-03-02 DIAGNOSIS — E119 Type 2 diabetes mellitus without complications: Secondary | ICD-10-CM

## 2017-04-05 ENCOUNTER — Other Ambulatory Visit: Payer: Self-pay | Admitting: Family Medicine

## 2017-04-05 ENCOUNTER — Encounter: Payer: Self-pay | Admitting: Family Medicine

## 2017-04-05 ENCOUNTER — Ambulatory Visit (INDEPENDENT_AMBULATORY_CARE_PROVIDER_SITE_OTHER): Payer: Medicare Other | Admitting: Family Medicine

## 2017-04-05 VITALS — BP 144/74 | HR 60 | Temp 98.1°F | Resp 18 | Ht 73.0 in | Wt 304.0 lb

## 2017-04-05 DIAGNOSIS — Z0000 Encounter for general adult medical examination without abnormal findings: Secondary | ICD-10-CM

## 2017-04-05 DIAGNOSIS — E119 Type 2 diabetes mellitus without complications: Principal | ICD-10-CM

## 2017-04-05 DIAGNOSIS — Z125 Encounter for screening for malignant neoplasm of prostate: Secondary | ICD-10-CM | POA: Diagnosis not present

## 2017-04-05 DIAGNOSIS — I10 Essential (primary) hypertension: Secondary | ICD-10-CM | POA: Diagnosis not present

## 2017-04-05 DIAGNOSIS — Z23 Encounter for immunization: Secondary | ICD-10-CM | POA: Diagnosis not present

## 2017-04-05 DIAGNOSIS — E7800 Pure hypercholesterolemia, unspecified: Secondary | ICD-10-CM

## 2017-04-05 DIAGNOSIS — IMO0001 Reserved for inherently not codable concepts without codable children: Secondary | ICD-10-CM

## 2017-04-05 DIAGNOSIS — Z794 Long term (current) use of insulin: Principal | ICD-10-CM

## 2017-04-05 DIAGNOSIS — E785 Hyperlipidemia, unspecified: Secondary | ICD-10-CM

## 2017-04-05 DIAGNOSIS — Z136 Encounter for screening for cardiovascular disorders: Secondary | ICD-10-CM | POA: Diagnosis not present

## 2017-04-05 DIAGNOSIS — G709 Myoneural disorder, unspecified: Secondary | ICD-10-CM

## 2017-04-05 LAB — CBC WITH DIFFERENTIAL/PLATELET
BASOS PCT: 1 %
Basophils Absolute: 76 cells/uL (ref 0–200)
EOS PCT: 3 %
Eosinophils Absolute: 228 cells/uL (ref 15–500)
HCT: 44.9 % (ref 38.5–50.0)
Hemoglobin: 14.8 g/dL (ref 13.0–17.0)
LYMPHS PCT: 26 %
Lymphs Abs: 1976 cells/uL (ref 850–3900)
MCH: 30.1 pg (ref 27.0–33.0)
MCHC: 33 g/dL (ref 32.0–36.0)
MCV: 91.3 fL (ref 80.0–100.0)
MONOS PCT: 10 %
MPV: 11 fL (ref 7.5–12.5)
Monocytes Absolute: 760 cells/uL (ref 200–950)
NEUTROS ABS: 4560 {cells}/uL (ref 1500–7800)
Neutrophils Relative %: 60 %
PLATELETS: 143 10*3/uL (ref 140–400)
RBC: 4.92 MIL/uL (ref 4.20–5.80)
RDW: 14.6 % (ref 11.0–15.0)
WBC: 7.6 10*3/uL (ref 3.8–10.8)

## 2017-04-05 LAB — LIPID PANEL
Cholesterol: 99 mg/dL (ref <200)
HDL: 38 mg/dL — AB (ref >40)
LDL Cholesterol: 40 mg/dL (ref <100)
TRIGLYCERIDES: 103 mg/dL (ref <150)
Total CHOL/HDL Ratio: 2.6 Ratio (ref <5.0)
VLDL: 21 mg/dL (ref <30)

## 2017-04-05 LAB — COMPLETE METABOLIC PANEL WITH GFR
ALT: 25 U/L (ref 9–46)
AST: 20 U/L (ref 10–35)
Albumin: 4.6 g/dL (ref 3.6–5.1)
Alkaline Phosphatase: 55 U/L (ref 40–115)
BILIRUBIN TOTAL: 0.5 mg/dL (ref 0.2–1.2)
BUN: 24 mg/dL (ref 7–25)
CHLORIDE: 103 mmol/L (ref 98–110)
CO2: 25 mmol/L (ref 20–32)
Calcium: 10 mg/dL (ref 8.6–10.3)
Creat: 0.89 mg/dL (ref 0.70–1.25)
GFR, EST NON AFRICAN AMERICAN: 89 mL/min (ref >=60)
GLUCOSE: 129 mg/dL — AB (ref 70–99)
Potassium: 4.4 mmol/L (ref 3.5–5.3)
SODIUM: 140 mmol/L (ref 135–146)
TOTAL PROTEIN: 7 g/dL (ref 6.1–8.1)

## 2017-04-05 MED ORDER — PIOGLITAZONE HCL 30 MG PO TABS: 30.0000 mg | ORAL_TABLET | Freq: Every day | ORAL | 1 refills | Status: DC

## 2017-04-05 MED ORDER — MOMETASONE FUROATE 0.1 % EX CREA: 1.0000 "application " | TOPICAL_CREAM | Freq: Every day | CUTANEOUS | 0 refills | Status: DC

## 2017-04-05 MED ORDER — ASPIRIN EC 81 MG PO TBEC: 162.0000 mg | DELAYED_RELEASE_TABLET | Freq: Every day | ORAL | 3 refills | Status: DC

## 2017-04-05 MED ORDER — INSULIN GLARGINE 100 UNIT/ML ~~LOC~~ SOLN: SUBCUTANEOUS | 3 refills | Status: DC

## 2017-04-05 MED ORDER — ROSUVASTATIN CALCIUM 40 MG PO TABS: 20.0000 mg | ORAL_TABLET | Freq: Every day | ORAL | 3 refills | Status: DC

## 2017-04-05 MED ORDER — EZETIMIBE 10 MG PO TABS: 10.0000 mg | ORAL_TABLET | Freq: Every day | ORAL | 3 refills | Status: DC

## 2017-04-05 MED ORDER — METFORMIN HCL 500 MG PO TABS: 1000.0000 mg | ORAL_TABLET | Freq: Two times a day (BID) | ORAL | 3 refills | Status: DC

## 2017-04-05 MED ORDER — AMLODIPINE BESYLATE 10 MG PO TABS: 10.0000 mg | ORAL_TABLET | Freq: Every day | ORAL | 3 refills | Status: DC

## 2017-04-05 MED ORDER — MELOXICAM 7.5 MG PO TABS: ORAL_TABLET | ORAL | 3 refills | Status: DC

## 2017-04-05 MED ORDER — OMEGA-3-ACID ETHYL ESTERS 1 G PO CAPS: ORAL_CAPSULE | ORAL | 3 refills | Status: DC

## 2017-04-05 MED ORDER — METOPROLOL SUCCINATE ER 25 MG PO TB24: 25.0000 mg | ORAL_TABLET | Freq: Every day | ORAL | 3 refills | Status: DC

## 2017-04-05 MED ORDER — LISINOPRIL 20 MG PO TABS: 40.0000 mg | ORAL_TABLET | Freq: Every day | ORAL | 3 refills | Status: DC

## 2017-04-05 MED ORDER — LIRAGLUTIDE 18 MG/3ML ~~LOC~~ SOPN: 1.2000 mg | PEN_INJECTOR | Freq: Every day | SUBCUTANEOUS | 3 refills | Status: DC

## 2017-04-05 MED ORDER — HUMALOG 100 UNIT/ML ~~LOC~~ SOLN
40.0000 [IU] | Freq: Three times a day (TID) | SUBCUTANEOUS | 3 refills | Status: DC
Start: 2017-04-05 — End: 2017-04-27

## 2017-04-05 NOTE — Progress Notes (Signed)
Subjective:    Patient ID: Ronald Dorsey, male    DOB: 09/09/1950, 66 y.o.   MRN: [id number]  Medication Refill    01/01/17 A1c was 7.5 last time.  I recommended stopping invokana and trying trulicity.  Ultimately had to use victoza due to insurance. Wt Readings from Last 3 Encounters:  01/01/17 (!) 302 lb (137 kg)  04/23/16 (!) 304 lb (137.9 kg)  08/20/15 (!) 303 lb (137.4 kg)   His weight has not changed significantly.  Due for prevnar 13, hep c and hiv testing.  Eye exam is also due.  Patient admits that he still eating too much. He is not making any attempt to monitor his diet. His blood pressure is also significantly elevated today. He declines hepatitis C. He declines HIV testing. He would like to schedule a complete physical exam in 3 months and at that time we can update his immunizations. Diabetic foot exam was performed today was normal.  At that time, my plan was: Blood pressures elevated. Begin amlodipine 10 mg a day and recheck blood pressure at his physical exam in 3 months. Recommended Prevnar 13. He will receive that his physical exam. Also recommended annual diabetic eye exam. He states that he will schedule this on his own. Check a hemoglobin A1c. Goal hemoglobin A1c is less than 7. I challenged the patient to try to lose 30 pounds in the next 6 months. He must work on diet and exercise. Recheck in 3 months.  Lipids were good and HgA1c was marginal at 7.4.  04/05/17 Here for CPE.  Due for prevnar 13, DRE/PSA, hep C screen.  As a former smoker over 29, he is also due for AAA screen.  Patient has not lost any weight from his last visit. He admits that he has not been working on diet or exercise like he promised he would. His blood pressure is borderline elevated today. He denies any chest pain shortness of breath or dyspnea on exertion. He did receive a tetanus shot recently after he suffered a puncture wound to his medial left gastrocnemius area. Apparently his insurance is not  covering that despite the fact he was for an injury. He is asking me to check on that. He is also requesting 90 day prescriptions of all his medication. Past Medical History:  Diagnosis Date  . Apnea   . Arthritis   . Diabetes mellitus without complication ([place])   . Hypercholesterolemia   . Hypertension   . Neuromuscular disorder ([facility])    sciatica  . PSVT (paroxysmal supraventricular tachycardia) ([place])    Past Surgical History:  Procedure Laterality Date  . CARPAL TUNNEL RELEASE  2010   left wrist  . EYE SURGERY    . TONSILLECTOMY  1961   Current Outpatient Prescriptions on File Prior to Visit  Medication Sig Dispense Refill  . amLODipine (NORVASC) 10 MG tablet Take 1 tablet (10 mg total) by mouth daily. 90 tablet 3  . aspirin EC 81 MG tablet Take 162 mg by mouth at bedtime.    . B-D INS SYRINGE 0.5CC/30GX1/2" 30G X 1/2" 0.5 ML MISC USE WITH INSULIN 5 TIMES A DAY AS DIRECTED 100 each 6  . ezetimibe (ZETIA) 10 MG tablet take 1 tablet by mouth once daily 90 tablet 1  . glucose blood (BAYER CONTOUR NEXT TEST) test strip TEST BLOOD SUGAR FOUR TIMES DAILY 125 each 11  . HUMALOG 100 UNIT/ML injection Inject 40 Units into the skin 3 (three) times daily with meals.  Sliding scalle  1  . Insulin Pen Needle 31G X 8 MM MISC Use 1 daily 100 each 5  . LANTUS 100 UNIT/ML injection INJECT 50 UNITS SUBCUTANEOUSLY TWICE A DAY 30 mL 3  . liraglutide (VICTOZA) 18 MG/3ML SOPN Inject 0.2 mLs (1.2 mg total) into the skin daily. 9 mL 0  . lisinopril (PRINIVIL,ZESTRIL) 20 MG tablet take 2 tablets by mouth daily 180 tablet 3  . LYRICA 150 MG capsule TAKE 1 CAPSULE BY MOUTH 2 TIMES DAILY 60 capsule 5  . meloxicam (MOBIC) 7.5 MG tablet TAKE 1 TABLET BY MOUTH DAILY AS NEEDED FOR PAIN /IMFLAMMATION 90 tablet 4  . metFORMIN (GLUCOPHAGE) 500 MG tablet take 2 tablets by mouth twice a day 120 tablet 5  . metoprolol succinate (TOPROL-XL) 25 MG 24 hr tablet take 1 tablet by mouth once daily 90 tablet 3  . omega-3  acid ethyl esters (LOVAZA) 1 g capsule TAKE 2 CAPSULES BY MOUTH 2 TIMES DAILY 120 capsule 11  . pioglitazone (ACTOS) 30 MG tablet take 1 tablet by mouth once daily 90 tablet 1  . rosuvastatin (CRESTOR) 40 MG tablet TAKE 1/2 TABLET BY MOUTH AT BEDTIME 30 tablet 5   No current facility-administered medications on file prior to visit.    No Known Allergies Social History   Social History  . Marital status: Married    Spouse name: N/A  . Number of children: N/A  . Years of education: N/A   Occupational History  . Not on file.   Social History Main Topics  . Smoking status: Former Research scientist (life sciences)  . Smokeless tobacco: Former Systems developer  . Alcohol use 0.0 oz/week     Comment: 1 glass of wine a month or less  . Drug use: No  . 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 Not on file   Social History Narrative  . No narrative on file   No family history on file.    Review of Systems  All other systems reviewed and are negative.      Objective:   Physical Exam  Constitutional: He is oriented to person, place, and time. He appears well-developed and well-nourished. No distress.  HENT:  Head: Normocephalic and atraumatic.  Right Ear: External ear normal.  Left Ear: External ear normal.  Nose: Nose normal.  Mouth/Throat: Oropharynx is clear and moist. No oropharyngeal exudate.  Eyes: Pupils are equal, round, and reactive to light. Conjunctivae and EOM are normal. Right eye exhibits no discharge. Left eye exhibits no discharge. No scleral icterus.  Neck: Normal range of motion. Neck supple. No JVD present. No tracheal deviation present. No thyromegaly present.  Cardiovascular: Normal rate, regular rhythm and normal heart sounds.  Exam reveals no gallop and no friction rub.   No murmur heard. Pulmonary/Chest: Effort normal and breath sounds normal. No stridor. No respiratory distress. He has no wheezes. He has no rales. He exhibits no  tenderness.  Abdominal: Soft. Bowel sounds are normal. He exhibits no distension and no mass. There is no tenderness. There is no rebound and no guarding.  Musculoskeletal: Normal range of motion. He exhibits no edema, tenderness or deformity.  Lymphadenopathy:    He has no cervical adenopathy.  Neurological: He is alert and oriented to person, place, and time. He has normal reflexes. He displays normal reflexes. No cranial nerve deficit. He exhibits normal muscle tone. Coordination normal.  Skin: Skin is warm. No rash noted.  He is not diaphoretic. No erythema. No pallor.  Psychiatric: He has a normal mood and affect. His behavior is normal. Judgment and thought content normal.  Vitals reviewed.         Assessment & Plan:  Diabetes mellitus, insulin dependent (IDDM), controlled ([place])  Pure hypercholesterolemia  Essential hypertension  General medical exam Patient's colonoscopy is up-to-date. It was done in 2014 and is not due again until 2024. He received Prevnar 13 today. His tetanus shot is up-to-date and I will check on the coding to make sure it was coded correctly so he does not have to pay for that as this should be covered by his insurance. I will screen the patient for hepatitis C. I will also check a PSA for prostate cancer screening. On examination today, there is a hyperpigmented plaque-like rash in the intergluteal cleft that appears to be some type of atopic dermatitis. Will treat this empirically with Elocon ointment. If the rash improves, no further treatment is necessary. If the rash persists, we could perform a biopsy to evaluate the cause of the rash. I will screen the patient for a AAA with an ultrasound. I will also check a hemoglobin A1c, CBC, fasting lipid panel, CMP.  Strongly encouraged the patient to try to lose 10-15 pounds by his next office visit. The patient would like to try to do this prior to taking more medication to address his blood pressure and hemoglobin  A1c

## 2017-04-05 NOTE — Telephone Encounter (Signed)
Pt here for office visit.  Provider ask refill all routine meds for 1 year  Done

## 2017-04-06 ENCOUNTER — Telehealth: Payer: Self-pay | Admitting: *Deleted

## 2017-04-06 LAB — HEMOGLOBIN A1C
Hgb A1c MFr Bld: 7.5 % — ABNORMAL HIGH (ref <5.7)
Mean Plasma Glucose: 169 mg/dL

## 2017-04-06 LAB — PSA: PSA: 1.2 ng/mL (ref <=4.0)

## 2017-04-06 LAB — HEPATITIS C ANTIBODY: HCV AB: NONREACTIVE

## 2017-04-06 NOTE — Telephone Encounter (Signed)
Received request from pharmacy for PA on Humalog.   PA submitted.   Dx: [id number].

## 2017-04-06 NOTE — Telephone Encounter (Signed)
Your information has been submitted to [employer] [place]. [employer] [place] will review the request and notify you of the determination decision directly, typically within 3 business days of your submission and once all necessary information is received.  You will also receive your request decision electronically. To check for an update later, open the request again from your dashboard.  If [employer] [place] has not responded within the specified timeframe or if you have any questions about your PA submission, contact [employer] [place] directly at (MAPD) [phone] or (PDP) [phone]. 

## 2017-04-06 NOTE — Telephone Encounter (Signed)
I spoke with [place] with [employer] and she confirmed no prior authorization was needed for the Humalog pt is allowed 60 mls per 30 days. quanity is within limits.   Spoke with pharmacy they are experiencing problems with their system generating prior authorization forms on patients that do not need them.

## 2017-04-08 ENCOUNTER — Encounter: Payer: Self-pay | Admitting: Family Medicine

## 2017-04-23 ENCOUNTER — Other Ambulatory Visit: Payer: Self-pay | Admitting: Family Medicine

## 2017-04-23 ENCOUNTER — Encounter: Payer: Self-pay | Admitting: Family Medicine

## 2017-04-23 DIAGNOSIS — Z136 Encounter for screening for cardiovascular disorders: Secondary | ICD-10-CM

## 2017-04-27 ENCOUNTER — Other Ambulatory Visit: Payer: Self-pay | Admitting: Family Medicine

## 2017-05-04 ENCOUNTER — Ambulatory Visit
Admission: RE | Admit: 2017-05-04 | Discharge: 2017-05-04 | Disposition: A | Discharge status: Home / Self Care | Payer: Medicare Other | Source: Ambulatory Visit | Attending: Family Medicine | Admitting: Family Medicine

## 2017-05-04 DIAGNOSIS — Z136 Encounter for screening for cardiovascular disorders: Secondary | ICD-10-CM

## 2017-05-04 DIAGNOSIS — Z87891 Personal history of nicotine dependence: Secondary | ICD-10-CM | POA: Diagnosis not present

## 2017-05-07 ENCOUNTER — Encounter: Payer: Self-pay | Admitting: *Deleted

## 2017-05-12 ENCOUNTER — Encounter: Payer: Self-pay | Admitting: Family Medicine

## 2017-05-12 ENCOUNTER — Telehealth: Payer: Self-pay

## 2017-05-12 ENCOUNTER — Other Ambulatory Visit: Payer: Self-pay

## 2017-05-12 DIAGNOSIS — I10 Essential (primary) hypertension: Secondary | ICD-10-CM

## 2017-05-12 DIAGNOSIS — E119 Type 2 diabetes mellitus without complications: Secondary | ICD-10-CM

## 2017-05-12 DIAGNOSIS — G709 Myoneural disorder, unspecified: Secondary | ICD-10-CM

## 2017-05-12 DIAGNOSIS — E785 Hyperlipidemia, unspecified: Secondary | ICD-10-CM

## 2017-05-12 DIAGNOSIS — Z794 Long term (current) use of insulin: Secondary | ICD-10-CM

## 2017-05-12 MED ORDER — HUMALOG 100 UNIT/ML ~~LOC~~ SOLN: SUBCUTANEOUS | 2 refills | Status: DC

## 2017-05-12 MED ORDER — PREGABALIN 150 MG PO CAPS: ORAL_CAPSULE | ORAL | 2 refills | Status: DC

## 2017-05-12 NOTE — Telephone Encounter (Signed)
Sent 90 day supply to patient's pharmacy for Humalog and  Lyrica

## 2017-05-12 NOTE — Telephone Encounter (Signed)
Patient called had concern about medication

## 2017-05-13 MED ORDER — AMLODIPINE BESYLATE 10 MG PO TABS
10.0000 mg | ORAL_TABLET | Freq: Every day | ORAL | 3 refills | Status: DC
Start: 2017-05-13 — End: 2018-02-01

## 2017-05-13 MED ORDER — ROSUVASTATIN CALCIUM 40 MG PO TABS: 20.0000 mg | ORAL_TABLET | Freq: Every day | ORAL | 3 refills | Status: DC

## 2017-05-13 MED ORDER — INSULIN GLARGINE 100 UNIT/ML ~~LOC~~ SOLN: SUBCUTANEOUS | 3 refills | Status: DC

## 2017-05-13 MED ORDER — LIRAGLUTIDE 18 MG/3ML ~~LOC~~ SOPN: 1.2000 mg | PEN_INJECTOR | Freq: Every day | SUBCUTANEOUS | 3 refills | Status: DC

## 2017-05-13 MED ORDER — METFORMIN HCL 500 MG PO TABS: 1000.0000 mg | ORAL_TABLET | Freq: Two times a day (BID) | ORAL | 3 refills | Status: DC

## 2017-05-13 MED ORDER — LISINOPRIL 20 MG PO TABS: 40.0000 mg | ORAL_TABLET | Freq: Every day | ORAL | 3 refills | Status: DC

## 2017-05-13 MED ORDER — METOPROLOL SUCCINATE ER 25 MG PO TB24: 25.0000 mg | ORAL_TABLET | Freq: Every day | ORAL | 3 refills | Status: DC

## 2017-05-13 MED ORDER — PIOGLITAZONE HCL 30 MG PO TABS: 30.0000 mg | ORAL_TABLET | Freq: Every day | ORAL | 1 refills | Status: DC

## 2017-05-13 MED ORDER — EZETIMIBE 10 MG PO TABS: 10.0000 mg | ORAL_TABLET | Freq: Every day | ORAL | 3 refills | Status: DC

## 2017-05-13 MED ORDER — MELOXICAM 7.5 MG PO TABS: ORAL_TABLET | ORAL | 3 refills | Status: DC

## 2017-05-13 MED ORDER — OMEGA-3-ACID ETHYL ESTERS 1 G PO CAPS: ORAL_CAPSULE | ORAL | 3 refills | Status: DC

## 2017-06-30 ENCOUNTER — Encounter: Payer: Self-pay | Admitting: Family Medicine

## 2017-07-01 MED ORDER — "INSULIN SYRINGE-NEEDLE U-100 30G X 1/2"" 0.5 ML MISC": 11 refills | Status: DC

## 2017-07-01 MED ORDER — GLUCOSE BLOOD VI STRP: ORAL_STRIP | 11 refills | Status: DC

## 2017-07-20 DIAGNOSIS — H2513 Age-related nuclear cataract, bilateral: Secondary | ICD-10-CM | POA: Diagnosis not present

## 2017-07-20 DIAGNOSIS — H35361 Drusen (degenerative) of macula, right eye: Secondary | ICD-10-CM | POA: Diagnosis not present

## 2017-07-20 DIAGNOSIS — H25013 Cortical age-related cataract, bilateral: Secondary | ICD-10-CM | POA: Diagnosis not present

## 2017-07-20 DIAGNOSIS — E119 Type 2 diabetes mellitus without complications: Secondary | ICD-10-CM | POA: Diagnosis not present

## 2017-07-20 DIAGNOSIS — H35033 Hypertensive retinopathy, bilateral: Secondary | ICD-10-CM | POA: Diagnosis not present

## 2017-07-20 LAB — HM DIABETES EYE EXAM

## 2017-07-23 ENCOUNTER — Encounter: Payer: Self-pay | Admitting: Family Medicine

## 2017-09-24 ENCOUNTER — Other Ambulatory Visit: Payer: Self-pay | Admitting: Family Medicine

## 2017-11-04 ENCOUNTER — Other Ambulatory Visit: Payer: Self-pay | Admitting: Family Medicine

## 2017-11-04 DIAGNOSIS — E119 Type 2 diabetes mellitus without complications: Secondary | ICD-10-CM

## 2017-11-04 DIAGNOSIS — Z794 Long term (current) use of insulin: Principal | ICD-10-CM

## 2017-12-01 ENCOUNTER — Other Ambulatory Visit: Payer: Self-pay | Admitting: Family Medicine

## 2017-12-26 ENCOUNTER — Other Ambulatory Visit: Payer: Self-pay | Admitting: Family Medicine

## 2017-12-26 DIAGNOSIS — I10 Essential (primary) hypertension: Secondary | ICD-10-CM

## 2018-01-18 ENCOUNTER — Other Ambulatory Visit: Payer: Self-pay | Admitting: Family Medicine

## 2018-02-01 ENCOUNTER — Other Ambulatory Visit: Payer: Self-pay | Admitting: Family Medicine

## 2018-02-01 DIAGNOSIS — I10 Essential (primary) hypertension: Secondary | ICD-10-CM

## 2018-02-01 DIAGNOSIS — E119 Type 2 diabetes mellitus without complications: Secondary | ICD-10-CM

## 2018-02-01 DIAGNOSIS — Z794 Long term (current) use of insulin: Secondary | ICD-10-CM

## 2018-03-17 ENCOUNTER — Encounter: Payer: Self-pay | Admitting: Family Medicine

## 2018-03-17 DIAGNOSIS — G709 Myoneural disorder, unspecified: Secondary | ICD-10-CM

## 2018-03-18 MED ORDER — PREGABALIN 150 MG PO CAPS: ORAL_CAPSULE | ORAL | 2 refills | Status: DC

## 2018-03-18 NOTE — Telephone Encounter (Signed)
Ok to refill??  Last office visit 04/05/2017.  Last refill 05/02/2017, #2 refills.

## 2018-03-21 ENCOUNTER — Other Ambulatory Visit: Payer: Self-pay | Admitting: Family Medicine

## 2018-03-28 ENCOUNTER — Other Ambulatory Visit: Payer: Self-pay | Admitting: Family Medicine

## 2018-03-28 MED ORDER — LISINOPRIL 20 MG PO TABS: 40.0000 mg | ORAL_TABLET | Freq: Every day | ORAL | 3 refills | Status: DC

## 2018-04-25 ENCOUNTER — Other Ambulatory Visit: Payer: Self-pay | Admitting: Family Medicine

## 2018-04-25 DIAGNOSIS — Z794 Long term (current) use of insulin: Principal | ICD-10-CM

## 2018-04-25 DIAGNOSIS — E119 Type 2 diabetes mellitus without complications: Secondary | ICD-10-CM

## 2018-04-26 ENCOUNTER — Other Ambulatory Visit: Payer: Self-pay | Admitting: Family Medicine

## 2018-04-29 ENCOUNTER — Other Ambulatory Visit: Payer: Self-pay | Admitting: Family Medicine

## 2018-04-30 ENCOUNTER — Other Ambulatory Visit: Payer: Self-pay | Admitting: Family Medicine

## 2018-05-02 ENCOUNTER — Other Ambulatory Visit: Payer: Self-pay | Admitting: Family Medicine

## 2018-05-02 ENCOUNTER — Encounter: Payer: Self-pay | Admitting: Family Medicine

## 2018-05-04 ENCOUNTER — Encounter: Payer: Self-pay | Admitting: Family Medicine

## 2018-05-23 ENCOUNTER — Ambulatory Visit: Payer: Medicare Other | Admitting: Family Medicine

## 2018-06-05 ENCOUNTER — Other Ambulatory Visit: Payer: Self-pay | Admitting: Family Medicine

## 2018-06-08 ENCOUNTER — Other Ambulatory Visit: Payer: Self-pay | Admitting: Family Medicine

## 2018-06-16 ENCOUNTER — Encounter: Payer: Self-pay | Admitting: Family Medicine

## 2018-06-16 ENCOUNTER — Ambulatory Visit (INDEPENDENT_AMBULATORY_CARE_PROVIDER_SITE_OTHER): Payer: Medicare Other | Admitting: Family Medicine

## 2018-06-16 VITALS — BP 140/90 | HR 76 | Temp 98.0°F | Resp 18 | Ht 73.0 in | Wt 307.0 lb

## 2018-06-16 DIAGNOSIS — Z23 Encounter for immunization: Secondary | ICD-10-CM

## 2018-06-16 DIAGNOSIS — Z125 Encounter for screening for malignant neoplasm of prostate: Secondary | ICD-10-CM

## 2018-06-16 DIAGNOSIS — E7800 Pure hypercholesterolemia, unspecified: Secondary | ICD-10-CM | POA: Diagnosis not present

## 2018-06-16 DIAGNOSIS — E785 Hyperlipidemia, unspecified: Secondary | ICD-10-CM

## 2018-06-16 DIAGNOSIS — E119 Type 2 diabetes mellitus without complications: Secondary | ICD-10-CM

## 2018-06-16 DIAGNOSIS — I10 Essential (primary) hypertension: Secondary | ICD-10-CM | POA: Diagnosis not present

## 2018-06-16 DIAGNOSIS — Z794 Long term (current) use of insulin: Secondary | ICD-10-CM | POA: Diagnosis not present

## 2018-06-16 MED ORDER — GLUCOSE BLOOD VI STRP: ORAL_STRIP | 3 refills | Status: DC

## 2018-06-16 MED ORDER — EZETIMIBE 10 MG PO TABS: ORAL_TABLET | ORAL | 3 refills | Status: DC

## 2018-06-16 MED ORDER — HUMALOG 100 UNIT/ML ~~LOC~~ SOLN: SUBCUTANEOUS | 2 refills | Status: DC

## 2018-06-16 MED ORDER — METOPROLOL SUCCINATE ER 25 MG PO TB24: ORAL_TABLET | ORAL | 2 refills | Status: DC

## 2018-06-16 MED ORDER — INSULIN PEN NEEDLE 31G X 8 MM MISC: 5 refills | Status: DC

## 2018-06-16 MED ORDER — MELOXICAM 7.5 MG PO TABS: ORAL_TABLET | ORAL | 0 refills | Status: DC

## 2018-06-16 MED ORDER — AMLODIPINE BESYLATE 10 MG PO TABS: 10.0000 mg | ORAL_TABLET | Freq: Every day | ORAL | 3 refills | Status: DC

## 2018-06-16 MED ORDER — "INSULIN SYRINGE-NEEDLE U-100 30G X 1/2"" 0.5 ML MISC": 3 refills | Status: DC

## 2018-06-16 MED ORDER — PIOGLITAZONE HCL 30 MG PO TABS: ORAL_TABLET | ORAL | 2 refills | Status: DC

## 2018-06-16 MED ORDER — ROSUVASTATIN CALCIUM 40 MG PO TABS: 20.0000 mg | ORAL_TABLET | Freq: Every day | ORAL | 3 refills | Status: DC

## 2018-06-16 MED ORDER — OMEGA-3-ACID ETHYL ESTERS 1 G PO CAPS: ORAL_CAPSULE | ORAL | 3 refills | Status: DC

## 2018-06-16 MED ORDER — ASPIRIN 81 MG PO TBEC: DELAYED_RELEASE_TABLET | ORAL | 3 refills | Status: DC

## 2018-06-16 MED ORDER — LIRAGLUTIDE 18 MG/3ML ~~LOC~~ SOPN: PEN_INJECTOR | SUBCUTANEOUS | 2 refills | Status: DC

## 2018-06-16 MED ORDER — METFORMIN HCL 500 MG PO TABS: 1000.0000 mg | ORAL_TABLET | Freq: Two times a day (BID) | ORAL | 3 refills | Status: DC

## 2018-06-16 MED ORDER — INSULIN GLARGINE 100 UNIT/ML ~~LOC~~ SOLN: SUBCUTANEOUS | 3 refills | Status: DC

## 2018-06-16 MED ORDER — LISINOPRIL 20 MG PO TABS: 40.0000 mg | ORAL_TABLET | Freq: Every day | ORAL | 3 refills | Status: DC

## 2018-06-16 NOTE — Addendum Note (Signed)
Addended by: Shary Decamp B on: 06/16/2018 09:56 AM   Modules accepted: Orders

## 2018-06-16 NOTE — Progress Notes (Signed)
Subjective:    Patient ID: Ronald Dorsey, male    DOB: [birth year], 67 y.o.   MRN: [id number]  Medication Refill    01/01/17 A1c was 7.5 last time.  I recommended stopping invokana and trying trulicity.  Ultimately had to use victoza due to insurance. Wt Readings from Last 3 Encounters:  06/16/18 (!) 307 lb (139.3 kg)  04/05/17 (!) 304 lb (137.9 kg)  01/01/17 (!) 302 lb (137 kg)   His weight has not changed significantly.  Due for prevnar 13, hep c and hiv testing.  Eye exam is also due.  Patient admits that he still eating too much. He is not making any attempt to monitor his diet. His blood pressure is also significantly elevated today. He declines hepatitis C. He declines HIV testing. He would like to schedule a complete physical exam in 3 months and at that time we can update his immunizations. Diabetic foot exam was performed today was normal.  At that time, my plan was: Blood pressures elevated. Begin amlodipine 10 mg a day and recheck blood pressure at his physical exam in 3 months. Recommended Prevnar 13. He will receive that his physical exam. Also recommended annual diabetic eye exam. He states that he will schedule this on his own. Check a hemoglobin A1c. Goal hemoglobin A1c is less than 7. I challenged the patient to try to lose 30 pounds in the next 6 months. He must work on diet and exercise. Recheck in 3 months.  Lipids were good and HgA1c was marginal at 7.4.  04/05/17 Here for CPE.  Due for prevnar 13, DRE/PSA, hep C screen.  As a former smoker over 32, he is also due for AAA screen.  Patient has not lost any weight from his last visit. He admits that he has not been working on diet or exercise like he promised he would. His blood pressure is borderline elevated today. He denies any chest pain shortness of breath or dyspnea on exertion. He did receive a tetanus shot recently after he suffered a puncture wound to his medial left gastrocnemius area. Apparently his insurance is not  covering that despite the fact he was for an injury. He is asking me to check on that. He is also requesting 90 day prescriptions of all his medication.  At that time, my plan was: Patient's colonoscopy is up-to-date. It was done in 2014 and is not due again until 2024. He received Prevnar 13 today. His tetanus shot is up-to-date and I will check on the coding to make sure it was coded correctly so he does not have to pay for that as this should be covered by his insurance. I will screen the patient for hepatitis C. I will also check a PSA for prostate cancer screening. On examination today, there is a hyperpigmented plaque-like rash in the intergluteal cleft that appears to be some type of atopic dermatitis. Will treat this empirically with Elocon ointment. If the rash improves, no further treatment is necessary. If the rash persists, we could perform a biopsy to evaluate the cause of the rash. I will screen the patient for a AAA with an ultrasound. I will also check a hemoglobin A1c, CBC, fasting lipid panel, CMP.  Strongly encouraged the patient to try to lose 10-15 pounds by his next office visit. The patient would like to try to do this prior to taking more medication to address his blood pressure and hemoglobin A1c  06/16/18 Has not been seen since.  He  is due for flu shot today as well as diabetic foot exam.  Diabetic foot exam is performed today and is normal.  He does report burning paresthesias in both feet however this is adequately controlled with Lyrica.  He denies any numbness in his feet.  He has normal sensation.  He denies any chest pain.  He denies any shortness of breath.  He denies any dyspnea on exertion.  He denies any myalgias or right upper quadrant pain.  He states that his fasting blood sugars are usually 140s.  His later day blood sugars are 120s to 100s.  He denies anything greater than 200.  He denies any hypoglycemic episodes.  He is not exercising regularly.  He has not lost any  weight.  He denies any vision difficulties.  He is due for prostate cancer screening with PSA. Past Medical History:  Diagnosis Date  . Apnea   . Arthritis   . Diabetes mellitus without complication ([place])   . Hypercholesterolemia   . Hypertension   . Neuromuscular disorder ([facility])    sciatica  . PSVT (paroxysmal supraventricular tachycardia) ([place])    Past Surgical History:  Procedure Laterality Date  . CARPAL TUNNEL RELEASE  2010   left wrist  . EYE SURGERY    . TONSILLECTOMY  1961   Current Outpatient Medications on File Prior to Visit  Medication Sig Dispense Refill  . amLODipine (NORVASC) 10 MG tablet TAKE 1 TABLET BY MOUTH DAILY 90 tablet 0  . ASPIRIN LOW DOSE 81 MG EC tablet TAKE 2 TABLETS(162 MG) BY MOUTH AT BEDTIME 90 tablet 0  . CONTOUR NEXT TEST test strip USE AS DIRECTED TO CHECK BLOOD SUGAR FOUR TIMES DAILY 125 each 11  . ezetimibe (ZETIA) 10 MG tablet TAKE 1 TABLET(10 MG) BY MOUTH DAILY 90 tablet 0  . HUMALOG 100 UNIT/ML injection INJECT 40 UNITS INTO SKIN THREE TIMES DAILY WITH MEALS PER SLIDING SCALE 110 mL 0  . HUMALOG 100 UNIT/ML injection INJECT 40 UNITS INTO SKIN THREE TIMES DAILY WITH MEALS PER SLIDING SCALE 110 mL 0  . insulin glargine (LANTUS) 100 UNIT/ML injection ADMINISTER 50 UNITS UNDER THE SKIN TWICE DAILY 30 mL 0  . insulin glargine (LANTUS) 100 UNIT/ML injection ADMINISTER 50 UNITS UNDER THE SKIN TWICE DAILY 30 mL 0  . Insulin Pen Needle 31G X 8 MM MISC Use 1 daily 100 each 5  . Insulin Syringe-Needle U-100 (B-D INS SYRINGE 0.5CC/30GX1/2") 30G X 1/2" 0.5 ML MISC USE WITH INSULIN 5 TIMES A DAY AS DIRECTED 100 each 11  . lisinopril (PRINIVIL,ZESTRIL) 20 MG tablet Take 2 tablets (40 mg total) by mouth daily. 180 tablet 3  . meloxicam (MOBIC) 7.5 MG tablet TAKE 1 TABLET BY MOUTH DAILY AS NEEDED FOR PAIN AND IMFLAMMATION 90 tablet 0  . metFORMIN (GLUCOPHAGE) 500 MG tablet Take 2 tablets (1,000 mg total) by mouth 2 (two) times daily. 360 tablet 3  . metFORMIN  (GLUCOPHAGE) 500 MG tablet TAKE 2 TABLETS(1000 MG) BY MOUTH TWICE DAILY 360 tablet 0  . metoprolol succinate (TOPROL-XL) 25 MG 24 hr tablet TAKE 1 TABLET(25 MG) BY MOUTH DAILY 90 tablet 0  . mometasone (ELOCON) 0.1 % cream Apply 1 application topically daily. 45 g 0  . omega-3 acid ethyl esters (LOVAZA) 1 g capsule TAKE 2 CAPSULES BY MOUTH TWICE DAILY 360 capsule 0  . omega-3 acid ethyl esters (LOVAZA) 1 g capsule TAKE 2 CAPSULES BY MOUTH TWICE DAILY 360 capsule 0  . pioglitazone (ACTOS) 30 MG tablet  TAKE 1 TABLET(30 MG) BY MOUTH DAILY 90 tablet 0  . pioglitazone (ACTOS) 30 MG tablet TAKE 1 TABLET(30 MG) BY MOUTH DAILY 90 tablet 0  . pregabalin (LYRICA) 150 MG capsule TAKE 1 CAPSULE BY MOUTH 2 TIMES DAILY 180 capsule 2  . rosuvastatin (CRESTOR) 40 MG tablet Take 0.5 tablets (20 mg total) by mouth at bedtime. 90 tablet 3  . VICTOZA 18 MG/3ML SOPN INJECT 1.2 MG UNDER THE SKIN DAILY 9 mL 0   No current facility-administered medications on file prior to visit.    No Known Allergies Social History   Socioeconomic History  . Marital status: Married    Spouse name: Not on file  . Number of children: Not on file  . Years of education: Not on file  . Highest education level: Not on file  Occupational History  . Not on file  Social Needs  . Financial resource strain: Not on file  . Food insecurity:    Worry: Not on file    Inability: Not on file  . Transportation needs:    Medical: Not on file    Non-medical: Not on file  Tobacco Use  . Smoking status: Former Research scientist (life sciences)  . Smokeless tobacco: Former Network engineer and Sexual Activity  . Alcohol use: Yes    Comment: 1 glass of wine a month or less  . Drug use: No  . 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 Physical activity:    Days per week: Not on file    Minutes per session: Not on file  . Stress: Not on file  Relationships  . Social connections:    Talks on phone: Not on file    Gets  together: Not on file    Attends religious service: Not on file    Active member of club or organization: Not on file    Attends meetings of clubs or organizations: Not on file    Relationship status: Not on file  . Intimate partner violence:    Fear of current or ex partner: Not on file    Emotionally abused: Not on file    Physically abused: Not on file    Forced sexual activity: Not on file  Other Topics Concern  . Not on file  Social History Narrative  . Not on file   No family history on file.    Review of Systems  All other systems reviewed and are negative.      Objective:   Physical Exam  Constitutional: He is oriented to person, place, and time. He appears well-developed and well-nourished. No distress.  HENT:  Head: Normocephalic and atraumatic.  Right Ear: External ear normal.  Left Ear: External ear normal.  Nose: Nose normal.  Mouth/Throat: Oropharynx is clear and moist. No oropharyngeal exudate.  Eyes: Pupils are equal, round, and reactive to light. Conjunctivae and EOM are normal. Right eye exhibits no discharge. Left eye exhibits no discharge. No scleral icterus.  Neck: Normal range of motion. Neck supple. No JVD present. No tracheal deviation present. No thyromegaly present.  Cardiovascular: Normal rate, regular rhythm and normal heart sounds. Exam reveals no gallop and no friction rub.  No murmur heard. Pulmonary/Chest: Effort normal and breath sounds normal. No stridor. No respiratory distress. He has no wheezes. He has no rales. He exhibits no tenderness.  Abdominal: Soft. Bowel sounds are normal. He exhibits no distension and no mass. There is no tenderness. There is  no rebound and no guarding.  Musculoskeletal: Normal range of motion. He exhibits no edema, tenderness or deformity.  Lymphadenopathy:    He has no cervical adenopathy.  Neurological: He is alert and oriented to person, place, and time. He has normal reflexes. No cranial nerve deficit. He  exhibits normal muscle tone. Coordination normal.  Skin: Skin is warm. No rash noted. He is not diaphoretic. No erythema. No pallor.  Psychiatric: He has a normal mood and affect. His behavior is normal. Judgment and thought content normal.  Vitals reviewed.         Assessment & Plan:  Diabetes mellitus, type II, insulin dependent ([place]) - Plan: Hemoglobin A1c, CBC with Differential/Platelet, COMPLETE METABOLIC PANEL WITH GFR, Lipid panel, Microalbumin, urine  Essential hypertension  Pure hypercholesterolemia  Prostate cancer screening - Plan: PSA  Blood pressure is adequately controlled at 140/90.  I will not make any changes in his medication there.  His blood sugars are relatively well controlled.  I believe the best option for this patient is not more medication but I again encouraged him to try to lose weight.  I would really like to see him at 250 pounds.  He needs to change his diet and his lifestyle.  He is eating too much and exercising to little.  In a polite way I encouraged him to eat less, to eat more healthfully, and to try to get 30 minutes a day 5 days a week of aerobic exercise.  Patient received his flu shot today.  I will check a CBC, CMP, fasting lipid panel, and urine microalbumin.  I will screen for prostate cancer with a PSA

## 2018-06-17 LAB — COMPLETE METABOLIC PANEL WITH GFR
AG RATIO: 2.1 (calc) (ref 1.0–2.5)
ALBUMIN MSPROF: 4.4 g/dL (ref 3.6–5.1)
ALT: 29 U/L (ref 9–46)
AST: 30 U/L (ref 10–35)
Alkaline phosphatase (APISO): 51 U/L (ref 40–115)
BUN: 16 mg/dL (ref 7–25)
CALCIUM: 9.6 mg/dL (ref 8.6–10.3)
CO2: 26 mmol/L (ref 20–32)
CREATININE: 0.94 mg/dL (ref 0.70–1.25)
Chloride: 103 mmol/L (ref 98–110)
GFR, EST NON AFRICAN AMERICAN: 84 mL/min/{1.73_m2} (ref >=60)
GFR, Est African American: 97 mL/min/{1.73_m2} (ref >=60)
GLOBULIN: 2.1 g/dL (ref 1.9–3.7)
Glucose, Bld: 150 mg/dL — ABNORMAL HIGH (ref 65–99)
Potassium: 4.3 mmol/L (ref 3.5–5.3)
Sodium: 137 mmol/L (ref 135–146)
Total Bilirubin: 0.5 mg/dL (ref 0.2–1.2)
Total Protein: 6.5 g/dL (ref 6.1–8.1)

## 2018-06-17 LAB — CBC WITH DIFFERENTIAL/PLATELET
BASOS ABS: 71 {cells}/uL (ref 0–200)
Basophils Relative: 1 %
EOS ABS: 234 {cells}/uL (ref 15–500)
Eosinophils Relative: 3.3 %
HEMATOCRIT: 43.5 % (ref 38.5–50.0)
Hemoglobin: 14.7 g/dL (ref 13.2–17.1)
LYMPHS ABS: 1626 {cells}/uL (ref 850–3900)
MCH: 29.7 pg (ref 27.0–33.0)
MCHC: 33.8 g/dL (ref 32.0–36.0)
MCV: 87.9 fL (ref 80.0–100.0)
MPV: 12 fL (ref 7.5–12.5)
Monocytes Relative: 11.6 %
NEUTROS PCT: 61.2 %
Neutro Abs: 4345 cells/uL (ref 1500–7800)
Platelets: 148 10*3/uL (ref 140–400)
RBC: 4.95 10*6/uL (ref 4.20–5.80)
RDW: 13.1 % (ref 11.0–15.0)
Total Lymphocyte: 22.9 %
WBC: 7.1 10*3/uL (ref 3.8–10.8)
WBCMIX: 824 {cells}/uL (ref 200–950)

## 2018-06-17 LAB — LIPID PANEL
CHOL/HDL RATIO: 2.4 (calc) (ref <5.0)
Cholesterol: 95 mg/dL (ref <200)
HDL: 40 mg/dL — AB (ref >40)
LDL Cholesterol (Calc): 36 mg/dL (calc)
NON-HDL CHOLESTEROL (CALC): 55 mg/dL (ref <130)
TRIGLYCERIDES: 111 mg/dL (ref <150)

## 2018-06-17 LAB — HEMOGLOBIN A1C
HEMOGLOBIN A1C: 7.8 %{Hb} — AB (ref <5.7)
Mean Plasma Glucose: 177 (calc)
eAG (mmol/L): 9.8 (calc)

## 2018-06-17 LAB — MICROALBUMIN, URINE: MICROALB UR: 7.3 mg/dL

## 2018-06-17 LAB — PSA: PSA: 2.1 ng/mL (ref <=4.0)

## 2018-06-22 ENCOUNTER — Other Ambulatory Visit: Payer: Self-pay

## 2018-06-22 MED ORDER — GLUCOSE BLOOD VI STRP: ORAL_STRIP | 3 refills | Status: DC

## 2018-06-23 ENCOUNTER — Other Ambulatory Visit: Payer: Self-pay | Admitting: Family Medicine

## 2018-06-23 DIAGNOSIS — I10 Essential (primary) hypertension: Secondary | ICD-10-CM

## 2018-06-23 DIAGNOSIS — E785 Hyperlipidemia, unspecified: Secondary | ICD-10-CM

## 2018-07-23 ENCOUNTER — Other Ambulatory Visit: Payer: Self-pay | Admitting: Family Medicine

## 2018-07-23 DIAGNOSIS — Z794 Long term (current) use of insulin: Principal | ICD-10-CM

## 2018-07-23 DIAGNOSIS — E119 Type 2 diabetes mellitus without complications: Secondary | ICD-10-CM

## 2018-07-28 DIAGNOSIS — H25013 Cortical age-related cataract, bilateral: Secondary | ICD-10-CM | POA: Diagnosis not present

## 2018-07-28 DIAGNOSIS — E119 Type 2 diabetes mellitus without complications: Secondary | ICD-10-CM | POA: Diagnosis not present

## 2018-07-28 DIAGNOSIS — H40052 Ocular hypertension, left eye: Secondary | ICD-10-CM | POA: Diagnosis not present

## 2018-07-28 DIAGNOSIS — H2513 Age-related nuclear cataract, bilateral: Secondary | ICD-10-CM | POA: Diagnosis not present

## 2018-07-28 DIAGNOSIS — H35033 Hypertensive retinopathy, bilateral: Secondary | ICD-10-CM | POA: Diagnosis not present

## 2018-07-28 LAB — HM DIABETES EYE EXAM

## 2018-07-31 ENCOUNTER — Other Ambulatory Visit: Payer: Self-pay | Admitting: Family Medicine

## 2018-08-03 ENCOUNTER — Encounter: Payer: Self-pay | Admitting: *Deleted

## 2018-08-25 ENCOUNTER — Encounter: Payer: Self-pay | Admitting: *Deleted

## 2018-09-09 ENCOUNTER — Other Ambulatory Visit: Payer: Self-pay | Admitting: Family Medicine

## 2018-09-14 ENCOUNTER — Other Ambulatory Visit: Payer: Self-pay | Admitting: Family Medicine

## 2018-09-19 ENCOUNTER — Other Ambulatory Visit: Payer: Self-pay | Admitting: Family Medicine

## 2018-09-19 DIAGNOSIS — G709 Myoneural disorder, unspecified: Secondary | ICD-10-CM

## 2018-09-19 MED ORDER — PREGABALIN 150 MG PO CAPS: ORAL_CAPSULE | ORAL | 1 refills | Status: DC

## 2018-09-19 NOTE — Telephone Encounter (Signed)
Ok to refill??  Last office visit 06/16/2018.  Last refill 03/18/2018, #2 refills.

## 2018-09-19 NOTE — Addendum Note (Signed)
Addended by: Sheral Flow on: 09/19/2018 01:56 PM   Modules accepted: Orders

## 2018-09-19 NOTE — Telephone Encounter (Signed)
Please re-send as transmission to pharmacy failed.  °

## 2018-09-22 ENCOUNTER — Emergency Department (HOSPITAL_COMMUNITY): Payer: Medicare Other

## 2018-09-22 ENCOUNTER — Other Ambulatory Visit: Payer: Self-pay

## 2018-09-22 ENCOUNTER — Observation Stay (HOSPITAL_COMMUNITY)
Admission: EM | Admit: 2018-09-22 | Discharge: 2018-09-24 | Disposition: A | Discharge status: Home / Self Care | Payer: Medicare Other | Attending: Internal Medicine | Admitting: Internal Medicine

## 2018-09-22 ENCOUNTER — Encounter (HOSPITAL_COMMUNITY): Payer: Self-pay | Admitting: *Deleted

## 2018-09-22 DIAGNOSIS — R079 Chest pain, unspecified: Secondary | ICD-10-CM | POA: Diagnosis present

## 2018-09-22 DIAGNOSIS — M1990 Unspecified osteoarthritis, unspecified site: Secondary | ICD-10-CM | POA: Diagnosis not present

## 2018-09-22 DIAGNOSIS — I209 Angina pectoris, unspecified: Secondary | ICD-10-CM | POA: Diagnosis not present

## 2018-09-22 DIAGNOSIS — I471 Supraventricular tachycardia: Secondary | ICD-10-CM | POA: Insufficient documentation

## 2018-09-22 DIAGNOSIS — Z79899 Other long term (current) drug therapy: Secondary | ICD-10-CM | POA: Insufficient documentation

## 2018-09-22 DIAGNOSIS — R072 Precordial pain: Principal | ICD-10-CM | POA: Insufficient documentation

## 2018-09-22 DIAGNOSIS — D72829 Elevated white blood cell count, unspecified: Secondary | ICD-10-CM | POA: Insufficient documentation

## 2018-09-22 DIAGNOSIS — I4891 Unspecified atrial fibrillation: Secondary | ICD-10-CM | POA: Diagnosis not present

## 2018-09-22 DIAGNOSIS — E119 Type 2 diabetes mellitus without complications: Secondary | ICD-10-CM | POA: Diagnosis not present

## 2018-09-22 DIAGNOSIS — Z794 Long term (current) use of insulin: Secondary | ICD-10-CM | POA: Insufficient documentation

## 2018-09-22 DIAGNOSIS — E785 Hyperlipidemia, unspecified: Secondary | ICD-10-CM | POA: Diagnosis not present

## 2018-09-22 DIAGNOSIS — Z87891 Personal history of nicotine dependence: Secondary | ICD-10-CM | POA: Insufficient documentation

## 2018-09-22 DIAGNOSIS — E7800 Pure hypercholesterolemia, unspecified: Secondary | ICD-10-CM | POA: Diagnosis not present

## 2018-09-22 DIAGNOSIS — Z7982 Long term (current) use of aspirin: Secondary | ICD-10-CM | POA: Diagnosis not present

## 2018-09-22 DIAGNOSIS — I10 Essential (primary) hypertension: Secondary | ICD-10-CM | POA: Diagnosis not present

## 2018-09-22 DIAGNOSIS — R0789 Other chest pain: Secondary | ICD-10-CM | POA: Diagnosis not present

## 2018-09-22 DIAGNOSIS — I200 Unstable angina: Secondary | ICD-10-CM | POA: Diagnosis present

## 2018-09-22 LAB — CBC
HCT: 45.1 % (ref 39.0–52.0)
Hemoglobin: 14.3 g/dL (ref 13.0–17.0)
MCH: 28.7 pg (ref 26.0–34.0)
MCHC: 31.7 g/dL (ref 30.0–36.0)
MCV: 90.6 fL (ref 80.0–100.0)
Platelets: 199 10*3/uL (ref 150–400)
RBC: 4.98 MIL/uL (ref 4.22–5.81)
RDW: 14 % (ref 11.5–15.5)
WBC: 15.3 10*3/uL — ABNORMAL HIGH (ref 4.0–10.5)
nRBC: 0 % (ref 0.0–0.2)

## 2018-09-22 LAB — GLUCOSE, CAPILLARY: Glucose-Capillary: 195 mg/dL — ABNORMAL HIGH (ref 70–99)

## 2018-09-22 LAB — BASIC METABOLIC PANEL
Anion gap: 10 (ref 5–15)
BUN: 16 mg/dL (ref 8–23)
CO2: 23 mmol/L (ref 22–32)
Calcium: 9.6 mg/dL (ref 8.9–10.3)
Chloride: 103 mmol/L (ref 98–111)
Creatinine, Ser: 1.05 mg/dL (ref 0.61–1.24)
GFR calc Af Amer: >60 mL/min (ref >60)
GFR calc non Af Amer: >60 mL/min (ref >60)
GLUCOSE: 105 mg/dL — AB (ref 70–99)
Potassium: 4.6 mmol/L (ref 3.5–5.1)
Sodium: 136 mmol/L (ref 135–145)

## 2018-09-22 LAB — I-STAT TROPONIN, ED
Troponin i, poc: 0.01 ng/mL (ref 0.00–0.08)
Troponin i, poc: 0 ng/mL (ref 0.00–0.08)

## 2018-09-22 LAB — TROPONIN I: Troponin I: <0.03 ng/mL (ref <0.03)

## 2018-09-22 MED ORDER — INSULIN ASPART 100 UNIT/ML ~~LOC~~ SOLN
0.0000 [IU] | Freq: Three times a day (TID) | SUBCUTANEOUS | Status: DC
  Administered 2018-09-23 (×3): 2 [IU] via SUBCUTANEOUS
  Administered 2018-09-24 (×2): 3 [IU] via SUBCUTANEOUS

## 2018-09-22 MED ORDER — SODIUM CHLORIDE 0.9% FLUSH
3.0000 mL | Freq: Once | INTRAVENOUS | Status: AC
  Administered 2018-09-22: 3 mL via INTRAVENOUS

## 2018-09-22 MED ORDER — ASPIRIN EC 81 MG PO TBEC
162.0000 mg | DELAYED_RELEASE_TABLET | Freq: Every day | ORAL | Status: DC
  Administered 2018-09-23: 162 mg via ORAL
  Filled 2018-09-22: qty 2

## 2018-09-22 MED ORDER — NITROGLYCERIN 0.4 MG SL SUBL
0.4000 mg | SUBLINGUAL_TABLET | SUBLINGUAL | Status: DC | PRN
  Administered 2018-09-22 (×2): 0.4 mg via SUBLINGUAL
  Filled 2018-09-22 (×2): qty 1

## 2018-09-22 MED ORDER — EZETIMIBE 10 MG PO TABS
10.0000 mg | ORAL_TABLET | Freq: Every day | ORAL | Status: DC
  Administered 2018-09-22 – 2018-09-23 (×2): 10 mg via ORAL
  Filled 2018-09-22 (×2): qty 1

## 2018-09-22 MED ORDER — PREGABALIN 75 MG PO CAPS
150.0000 mg | ORAL_CAPSULE | Freq: Every day | ORAL | Status: DC
  Administered 2018-09-22 – 2018-09-23 (×2): 150 mg via ORAL
  Filled 2018-09-22 (×2): qty 2

## 2018-09-22 MED ORDER — ASPIRIN 81 MG PO CHEW
324.0000 mg | CHEWABLE_TABLET | Freq: Once | ORAL | Status: AC
  Administered 2018-09-22: 324 mg via ORAL
  Filled 2018-09-22: qty 4

## 2018-09-22 MED ORDER — ROSUVASTATIN CALCIUM 20 MG PO TABS
20.0000 mg | ORAL_TABLET | Freq: Every day | ORAL | Status: DC
  Administered 2018-09-22 – 2018-09-23 (×2): 20 mg via ORAL
  Filled 2018-09-22 (×2): qty 1

## 2018-09-22 MED ORDER — ACETAMINOPHEN 325 MG PO TABS: 650.0000 mg | ORAL_TABLET | ORAL | Status: DC | PRN

## 2018-09-22 MED ORDER — ENOXAPARIN SODIUM 80 MG/0.8ML ~~LOC~~ SOLN
70.0000 mg | SUBCUTANEOUS | Status: DC
  Administered 2018-09-22: 70 mg via SUBCUTANEOUS
  Filled 2018-09-22: qty 0.8

## 2018-09-22 MED ORDER — INSULIN GLARGINE 100 UNIT/ML ~~LOC~~ SOLN
50.0000 [IU] | Freq: Two times a day (BID) | SUBCUTANEOUS | Status: DC
  Filled 2018-09-22: qty 0.5

## 2018-09-22 MED ORDER — INSULIN ASPART 100 UNIT/ML ~~LOC~~ SOLN
0.0000 [IU] | Freq: Every day | SUBCUTANEOUS | Status: DC
  Administered 2018-09-23: 3 [IU] via SUBCUTANEOUS

## 2018-09-22 MED ORDER — OMEGA-3-ACID ETHYL ESTERS 1 G PO CAPS
1.0000 g | ORAL_CAPSULE | Freq: Two times a day (BID) | ORAL | Status: DC
  Administered 2018-09-22 – 2018-09-24 (×4): 1 g via ORAL
  Filled 2018-09-22 (×4): qty 1

## 2018-09-22 MED ORDER — NITROGLYCERIN IN D5W 200-5 MCG/ML-% IV SOLN
10.0000 ug/min | INTRAVENOUS | Status: DC
  Administered 2018-09-22: 10 ug/min via INTRAVENOUS
  Filled 2018-09-22 (×2): qty 250

## 2018-09-22 MED ORDER — ONDANSETRON HCL 4 MG/2ML IJ SOLN: 4.0000 mg | Freq: Four times a day (QID) | INTRAMUSCULAR | Status: DC | PRN

## 2018-09-22 MED ORDER — SODIUM CHLORIDE 0.9 % IV SOLN
INTRAVENOUS | Status: DC
  Administered 2018-09-22 – 2018-09-23 (×2): via INTRAVENOUS

## 2018-09-22 NOTE — H&P (Signed)
History and Physical    Ronald Dorsey OZD:[id number] DOB: 07-Jul-1951 DOA: 09/22/2018  PCP: [name], MD Patient coming from: Home  Chief Complaint: Chest pain  HPI: Ronald Dorsey is a 68 y.o. male with medical history significant of type 2 diabetes, hypertension, hyperlipidemia presenting to the hospital for evaluation of chest pain.  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  It was associated with diaphoresis.  No shortness of breath or nausea.  States he took 1 of his wife's nitroglycerin tablets which relieved 90% of his chest pain but he still continued to have some mild pain which led him to come into the ED for evaluation.  States in the ED he received 2 sublingual nitroglycerin tablets but continued to have chest pain.  He was then started on nitroglycerin drip which finally helped.  He is a former smoker; quit 25 years ago.  Denies any family history of CAD or MI.  Review of Systems: As per HPI otherwise 10 point review of systems negative.  Past Medical History:  Diagnosis Date  . Apnea   . Arthritis   . Diabetes mellitus without complication ([place])   . Hypercholesterolemia   . Hypertension   . Neuromuscular disorder (HCC)    sciatica  . PSVT (paroxysmal supraventricular tachycardia) ([place])     Past Surgical History:  Procedure Laterality Date  . CARPAL TUNNEL RELEASE  2010   left wrist  . EYE SURGERY    . TONSILLECTOMY  1961     reports that he has quit smoking. He has quit using smokeless tobacco. He reports current alcohol use. He reports that he does not use drugs.  No Known Allergies  No family history on file.  Prior to Admission medications   Medication Sig Start Date End Date Taking? Authorizing Provider  amLODipine (NORVASC) 10 MG tablet TAKE 1 TABLET(10 MG) BY MOUTH DAILY Patient taking differently: Take 10 mg by mouth daily.   06/23/18  Yes [name], MD  aspirin (ASPIRIN LOW DOSE) 81 MG EC tablet TAKE 2 TABLETS(162 MG) BY MOUTH AT BEDTIME Patient taking differently: Take 162 mg by mouth at bedtime.  06/16/18  Yes [name], MD  ezetimibe (ZETIA) 10 MG tablet TAKE 1 TABLET(10 MG) BY MOUTH DAILY Patient taking differently: Take 10 mg by mouth at bedtime.  06/16/18  Yes PickardCammie Mcgee, MD  HUMALOG 100 UNIT/ML injection INJECT 40 UNITS INTO SKIN THREE TIMES DAILY WITH MEALS PER SLIDING SCALE Patient taking differently: Inject 15-40 Units into the skin See admin instructions. INJECT 15-40 UNITS INTO SKIN THREE TIMES DAILY WITH MEALS, PER SLIDING SCALE 06/16/18  Yes [name], MD  ibuprofen (ADVIL,MOTRIN) 200 MG tablet Take 400 mg by mouth every 8 (eight) hours as needed (for pain).    Yes [provider]  insulin glargine (LANTUS) 100 UNIT/ML injection ADMINISTER 50 UNITS UNDER THE SKIN TWICE DAILY Patient taking differently: Inject 50 Units into the skin 2 (two) times daily with a meal.  06/16/18  Yes Pickard, Cammie Mcgee, MD  liraglutide (VICTOZA) 18 MG/3ML SOPN INJECT 1.2 MG UNDER THE SKIN ONCE DAILY Patient taking differently: Inject 1.2 mg into the skin daily before breakfast.  09/14/18  Yes [name], MD  lisinopril (PRINIVIL,ZESTRIL) 20 MG tablet Take 2 tablets (40 mg total) by mouth daily. Patient taking differently: Take 20 mg by mouth 2 (two) times  daily.  06/16/18  Yes [name], MD  meloxicam (MOBIC) 7.5 MG tablet TAKE 1 TABLET BY MOUTH DAILY AS NEEDED FOR PAIN AND IMFLAMMATION Patient taking differently: Take 7.5 mg by mouth daily as needed (for pain or inflammation).  08/01/18  Yes [name], MD  metFORMIN (GLUCOPHAGE) 500 MG tablet TAKE 2 TABLETS(1000 MG) BY MOUTH TWICE DAILY Patient taking differently: Take 1,000 mg by mouth 2 (two) times daily with a meal.  06/23/18  Yes [name], MD  metoprolol succinate (TOPROL-XL) 25 MG 24 hr tablet TAKE 1  TABLET(25 MG) BY MOUTH DAILY Patient taking differently: Take 25 mg by mouth at bedtime.  09/09/18  Yes [name], MD  omega-3 acid ethyl esters (LOVAZA) 1 g capsule TAKE 2 CAPSULES BY MOUTH TWICE DAILY Patient taking differently: Take 1 g by mouth 2 (two) times daily.  06/16/18  Yes [name], MD  pioglitazone (ACTOS) 30 MG tablet TAKE 1 TABLET(30 MG) BY MOUTH DAILY Patient taking differently: Take 30 mg by mouth daily before breakfast.  06/16/18  Yes Pickard, Cammie Mcgee, MD  pregabalin (LYRICA) 150 MG capsule TAKE 1 CAPSULE BY MOUTH TWICE A DAY Patient taking differently: Take 150 mg by mouth at bedtime.  09/19/18  Yes [name], MD  rosuvastatin (CRESTOR) 40 MG tablet TAKE 1/2 TABLET BY MOUTH AT BEDTIME Patient taking differently: Take 20 mg by mouth at bedtime.  06/23/18  Yes [name], MD  glucose blood (CONTOUR NEXT TEST) test strip USE AS DIRECTED TO CHECK BLOOD SUGAR FOUR TIMES DAILY.DX E11.9 06/22/18   [name], MD  Insulin Pen Needle 31G X 8 MM MISC Use 1 daily 06/16/18   [name], MD  Insulin Syringe-Needle U-100 (B-D INS SYRINGE 0.5CC/30GX1/2") 30G X 1/2" 0.5 ML MISC USE WITH INSULIN 5 TIMES A DAY AS DIRECTED 06/16/18   [name], MD  meloxicam (MOBIC) 7.5 MG tablet TAKE 1 TABLET BY MOUTH DAILY AS NEEDED FOR PAIN AND IMFLAMMATION Patient not taking: Reported on 09/22/2018 06/16/18   [name], MD  mometasone (ELOCON) 0.1 % cream Apply 1 application topically daily. Patient not taking: Reported on 09/22/2018 04/05/17   [name], MD  pioglitazone (ACTOS) 30 MG tablet TAKE 1 TABLET(30 MG) BY MOUTH DAILY Patient not taking: Reported on 09/22/2018 07/25/18   [name], MD    Physical Exam: Vitals:   09/22/18 1830 09/22/18 1900 09/22/18 1928 09/22/18 1930  BP: (!) 149/60 (!) 148/67 126/61 137/64  Pulse: 84 78 81 81  Resp: 20 16 12 17   Temp:      TempSrc:      SpO2: 97% 96% 98% 98%  Weight:      Height:          Physical Exam  Constitutional: He is oriented to person, place, and time. He appears well-developed and well-nourished. No distress.  HENT:  Head: Normocephalic.  Mouth/Throat: Oropharynx is clear and moist.  Eyes: Right eye exhibits no discharge. Left eye exhibits no discharge.  Neck: Neck supple. No JVD present.  Cardiovascular: Normal rate, regular rhythm and intact distal pulses.  Pulmonary/Chest: Effort normal and breath sounds normal. No respiratory distress. He has no wheezes. He has no rales.  Abdominal: Soft. Bowel sounds are normal. He exhibits no distension. There is no abdominal tenderness. There is no guarding.  Musculoskeletal:     Comments: Trace bilateral lower extremity edema  Neurological: He is alert and oriented to person,  place, and time.  Skin: Skin is warm and dry. He is not diaphoretic.  Psychiatric: He has a normal mood and affect. His behavior is normal.     Labs on Admission: I have personally reviewed following labs and imaging studies  CBC: Recent Labs  Lab 09/22/18 1459  WBC 15.3*  HGB 14.3  HCT 45.1  MCV 90.6  PLT 314   Basic Metabolic Panel: Recent Labs  Lab 09/22/18 1459  NA 136  K 4.6  CL 103  CO2 23  GLUCOSE 105*  BUN 16  CREATININE 1.05  CALCIUM 9.6   GFR: Estimated Creatinine Clearance: 98.9 mL/min (by C-G formula based on SCr of 1.05 mg/dL). Liver Function Tests: No results for input(s): AST, ALT, ALKPHOS, BILITOT, PROT, ALBUMIN in the last 168 hours. No results for input(s): LIPASE, AMYLASE in the last 168 hours. No results for input(s): AMMONIA in the last 168 hours. Coagulation Profile: No results for input(s): INR, PROTIME in the last 168 hours. Cardiac Enzymes: No results for input(s): CKTOTAL, CKMB, CKMBINDEX, TROPONINI in the last 168 hours. BNP (last 3 results) No results for input(s): PROBNP in the last 8760 hours. HbA1C: No results for input(s): HGBA1C in the last 72 hours. CBG: No results for input(s):  GLUCAP in the last 168 hours. Lipid Profile: No results for input(s): CHOL, HDL, LDLCALC, TRIG, CHOLHDL, LDLDIRECT in the last 72 hours. Thyroid Function Tests: No results for input(s): TSH, T4TOTAL, FREET4, T3FREE, THYROIDAB in the last 72 hours. Anemia Panel: No results for input(s): VITAMINB12, FOLATE, FERRITIN, TIBC, IRON, RETICCTPCT in the last 72 hours. Urine analysis: No results found for: COLORURINE, APPEARANCEUR, LABSPEC, [place], GLUCOSEU, [place], [place], [place], PROTEINUR, [place], NITRITE, LEUKOCYTESUR  Radiological Exams on Admission: Dg Chest 2 View  Result Date: 09/22/2018 CLINICAL DATA:  Left chest pain EXAM: CHEST - 2 VIEW COMPARISON:  July 22 2012 FINDINGS: The heart size and mediastinal contours are within normal limits. Both lungs are clear. The visualized skeletal structures are stable. IMPRESSION: No active cardiopulmonary disease. Electronically Signed   By: Abelardo Diesel M.D.   On: 09/22/2018 15:06    EKG: Independently reviewed.  Normal sinus rhythm, no acute ischemic changes.  Assessment/Plan Principal Problem:   Chest pain Active Problems:   Hypertension   Hypercholesterolemia   Diabetes mellitus without complication (HCC)   Leukocytosis   Chest pain -Heart score 6. Risk factors for coronary artery disease include age, gender, morbid obesity, hypertension, hyperlipidemia, and diabetes. -Hemodynamically stable -Troponin x2 negative.  EKG without acute ischemic changes. -Chest pain improved significantly at home after taking a sublingual nitroglycerin tablet.  He continued to have mild persistent pain which did not respond to 2 doses of SL nitroglycerin in the ED and was started on nitroglycerin drip which is helping. -Received aspirin 324 mg -Currently on nitroglycerin drip -Cardiac monitoring -Trend troponin -Echocardiogram -Consult cardiology in the morning as patient will need a stress test. -Lipid panel  Leukocytosis White count  15.3.  Patient is afebrile.  Not suggestive of pneumonia.  Nontoxic-appearing. -Check UA -Continue to monitor CBC  Hypertension -Currently on nitro drip.  Hold home antihypertensives at this time.  Hyperlipidemia -Continue home statin, Zetia, Lovaza  Type 2 diabetes -Continue home Lantus 50 twice daily -Sliding scale insulin sensitive -CBG checks -Check A1c level  DVT prophylaxis: Lovenox Code Status: Patient wishes to be full code. Family Communication: Wife at bedside. Disposition Plan: Anticipate discharge in 1 to 2 days. Consults called: None Admission status: Observation   Shela Leff  MD [facility] Pager [phone]  If 7PM-7AM, please contact night-coverage [email or web address] Password [facility]  09/22/2018, 7:37 PM

## 2018-09-22 NOTE — ED Triage Notes (Signed)
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

## 2018-09-22 NOTE — ED Notes (Signed)
Admitting provider at bedside.

## 2018-09-22 NOTE — ED Notes (Signed)
ED TO INPATIENT HANDOFF REPORT  Name/Age/Gender Ronald Dorsey 68 y.o. male  Code Status    Code Status Orders  (From admission, onward)         Start     Ordered   09/22/18 1926  Full code  Continuous     09/22/18 1927        Code Status History    This patient has a current code status but no historical code status.      Home/SNF/Other Home  Chief Complaint chest pain   Level of Care/Admitting Diagnosis ED Disposition    ED Disposition Condition Comment   Admit  Hospital Area: [facility] [[id number]]  Level of Care: Progressive [102]  I expect the patient will be discharged within 24 hours: No (not a candidate for 5C-Observation unit)  Diagnosis: Chest pain [528413]  Admitting Physician: Shela Leff [[id number]]  Attending Physician: Shela Leff [[id number]]  PT Class (Do Not Modify): Observation [104]  PT Acc Code (Do Not Modify): Observation [10022]       Medical History Past Medical History:  Diagnosis Date  . Apnea   . Arthritis   . Diabetes mellitus without complication ([place])   . Hypercholesterolemia   . Hypertension   . Neuromuscular disorder (HCC)    sciatica  . PSVT (paroxysmal supraventricular tachycardia) (HCC)     Allergies No Known Allergies  IV Location/Drains/Wounds Patient Lines/Drains/Airways Status   Active Line/Drains/Airways    Name:   Placement date:   Placement time:   Site:   Days:   Peripheral IV 09/22/18 Right Antecubital   09/22/18    1732    Antecubital   less than 1          Labs/Imaging Results for orders placed or performed during the hospital encounter of 09/22/18 (from the past 48 hour(s))  Basic metabolic panel     Status: Abnormal   Collection Time: 09/22/18  2:59 PM  Result Value Ref Range   Sodium 136 135 - 145 mmol/L   Potassium 4.6 3.5 - 5.1 mmol/L   Chloride 103 98 - 111 mmol/L   CO2 23 22 - 32 mmol/L   Glucose, Bld 105 (H) 70 - 99 mg/dL   BUN 16 8 - 23 mg/dL   Creatinine, Ser 1.05 0.61 - 1.24 mg/dL   Calcium 9.6 8.9 - 10.3 mg/dL   GFR calc non Af Amer >60 >60 mL/min   GFR calc Af Amer >60 >60 mL/min   Anion gap 10 5 - 15    Comment: Performed at [facility], [place] [address], [place], [place] [address]  CBC     Status: Abnormal   Collection Time: 09/22/18  2:59 PM  Result Value Ref Range   WBC 15.3 (H) 4.0 - 10.5 K/uL   RBC 4.98 4.22 - 5.81 MIL/uL   Hemoglobin 14.3 13.0 - 17.0 g/dL   HCT 45.1 39.0 - 52.0 %   MCV 90.6 80.0 - 100.0 fL   MCH 28.7 26.0 - 34.0 pg   MCHC 31.7 30.0 - 36.0 g/dL   RDW 14.0 11.5 - 15.5 %   Platelets 199 150 - 400 K/uL   nRBC 0.0 0.0 - 0.2 %    Comment: Performed at [facility], [place] [address] [place], [place] [address]  I-stat troponin, ED     Status: None   Collection Time: 09/22/18  3:00 PM  Result Value Ref Range   Troponin i, poc 0.01 0.00 - 0.08  ng/mL   Comment 3            Comment: Due to the release kinetics of cTnI, a negative result within the first hours of the onset of symptoms does not rule out myocardial infarction with certainty. If myocardial infarction is still suspected, repeat the test at appropriate intervals.   I-Stat Troponin, ED (not at [facility])     Status: None   Collection Time: 09/22/18  6:15 PM  Result Value Ref Range   Troponin i, poc 0.00 0.00 - 0.08 ng/mL   Comment 3            Comment: Due to the release kinetics of cTnI, a negative result within the first hours of the onset of symptoms does not rule out myocardial infarction with certainty. If myocardial infarction is still suspected, repeat the test at appropriate intervals.   Troponin I - Now Then Q6H     Status: None   Collection Time: 09/22/18  7:25 PM  Result Value Ref Range   Troponin I <0.03 <0.03 ng/mL    Comment: Performed at [facility], [place] [address], [place], [place] [address]   Dg Chest 2 View  Result Date: 09/22/2018 CLINICAL DATA:  Left chest pain EXAM: CHEST - 2 VIEW COMPARISON:   July 22 2012 FINDINGS: The heart size and mediastinal contours are within normal limits. Both lungs are clear. The visualized skeletal structures are stable. IMPRESSION: No active cardiopulmonary disease. Electronically Signed   By: Abelardo Diesel M.D.   On: 09/22/2018 15:06   EKG Interpretation  Date/Time:  Thursday September 22 2018 14:39:24 EST Ventricular Rate:  67 PR Interval:  182 QRS Duration: 96 QT Interval:  410 QTC Calculation: 433 R Axis:   71 Text Interpretation:  Normal sinus rhythm Normal ECG No acute changes No old tracing to compare Confirmed by Varney Biles [phone]) on 09/22/2018 5:37:05 PM   Pending Labs Unresulted Labs (From admission, onward)    Start     Ordered   09/23/18 0500  Hemoglobin A1c  Tomorrow morning,   R    Comments:  To assess prior glycemic control    09/22/18 1927   09/23/18 0500  CBC  Tomorrow morning,   R     09/22/18 1932   09/23/18 0500  Lipid panel  Tomorrow morning,   R     09/22/18 1935   09/22/18 2000  HIV Antibody (routine testing w rflx)  Once,   R     09/22/18 2000   09/22/18 1933  Urinalysis, Routine w reflex microscopic  Once,   R     09/22/18 1932   09/22/18 1927  Troponin I - Now Then Q6H  Now then every 6 hours,   R     09/22/18 1927          Vitals/Pain Today's Vitals   09/22/18 1945 09/22/18 2000 09/22/18 2015 09/22/18 2025  BP: (!) 122/54 131/60 (!) 125/58   Pulse: 75 77 75   Resp: 10 10 11    Temp:      TempSrc:      SpO2: 96% 96% 95%   Weight:      Height:      PainSc:    2     Isolation Precautions No active isolations  Medications Medications  nitroGLYCERIN 50 mg in dextrose 5 % 250 mL (0.2 mg/mL) infusion (25 mcg/min Intravenous Rate/Dose Verify 09/22/18 2024)  aspirin EC tablet 162 mg (has no administration in time  range)  ezetimibe (ZETIA) tablet 10 mg (has no administration in time range)  omega-3 acid ethyl esters (LOVAZA) capsule 1 g (has no administration in time range)  rosuvastatin (CRESTOR)  tablet 20 mg (has no administration in time range)  insulin glargine (LANTUS) injection 50 Units (has no administration in time range)  pregabalin (LYRICA) capsule 150 mg (has no administration in time range)  acetaminophen (TYLENOL) tablet 650 mg (has no administration in time range)  ondansetron (ZOFRAN) injection 4 mg (has no administration in time range)  enoxaparin (LOVENOX) injection 40 mg (has no administration in time range)  0.9 %  sodium chloride infusion (has no administration in time range)  insulin aspart (novoLOG) injection 0-9 Units (has no administration in time range)  insulin aspart (novoLOG) injection 0-5 Units (has no administration in time range)  sodium chloride flush (NS) 0.9 % injection 3 mL (3 mLs Intravenous Given 09/22/18 1812)  aspirin chewable tablet 324 mg (324 mg Oral Given 09/22/18 1809)    Mobility walks

## 2018-09-22 NOTE — ED Provider Notes (Signed)
[facility] EMERGENCY DEPARTMENT Provider Note   CSN: [id number] Arrival date & time: 09/22/18  1436     History   Chief Complaint Chief Complaint  Patient presents with  . Chest Pain    HPI Ronald Dorsey is a 68 y.o. male.  HPI   Johnn Krasowski is a 68 y.o. male with PMH of apnea, arthritis, diabetes on insulin, HLD, HTN, sciatic nerve disorder, PSVT who presents with his wife the bedside with chest pain that started today around 12:30 PM.  The patient does report that he had felt "bad" yesterday and the day before while at work.  He is normally very active at work, banding lumbar and loading trucks.  He had felt more fatigued and slightly short of breath during exertion yesterday.  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  He was diaphoretic at that time.  Not lightheaded or nauseous.  When he got off the forklift and walked across the warehouse and into the office his symptoms worsened as he walked further.  When he stopped it they slowly start to get better.  He took 1 of his wife's sublingual nitroglycerin with significant improvement within about 5 minutes.  He has had some persistent tightness in his chest since 12:30 PM.  Reports it was an 8 out of 10 earlier today prior to nitroglycerin and about a 2 out of 10 at this time.  He has no known history of cardiac disease.  Reports his blood pressure was slightly higher in the 160s to 170s when his pain was worse today.  Checked his glucose at that time and it was 140.  Past Medical History:  Diagnosis Date  . Apnea   . Arthritis   . Diabetes mellitus without complication ([place])   . Hypercholesterolemia   . Hypertension   . Neuromuscular disorder (HCC)    sciatica  . PSVT (paroxysmal supraventricular tachycardia) [facility])     Patient Active Problem List   Diagnosis Date Noted  . Chest pain  09/22/2018  . Leukocytosis 09/22/2018  . Hypertension   . Hypercholesterolemia   . Diabetes mellitus without complication ([place])   . Apnea   . PSVT (paroxysmal supraventricular tachycardia) ([place])     Past Surgical History:  Procedure Laterality Date  . CARPAL TUNNEL RELEASE  2010   left wrist  . EYE SURGERY    . TONSILLECTOMY  1961        Home Medications    Prior to Admission medications   Medication Sig Start Date End Date Taking? Authorizing Provider  amLODipine (NORVASC) 10 MG tablet TAKE 1 TABLET(10 MG) BY MOUTH DAILY Patient taking differently: Take 10 mg by mouth daily.  06/23/18  Yes [name], MD  aspirin (ASPIRIN LOW DOSE) 81 MG EC tablet TAKE 2 TABLETS(162 MG) BY MOUTH AT BEDTIME Patient taking differently: Take 162 mg by mouth at bedtime.  06/16/18  Yes [name], MD  ezetimibe (ZETIA) 10 MG tablet TAKE 1 TABLET(10 MG) BY MOUTH DAILY Patient taking differently: Take 10 mg by mouth at bedtime.  06/16/18  Yes PickardCammie Mcgee, MD  HUMALOG 100 UNIT/ML injection INJECT 40 UNITS INTO SKIN THREE TIMES DAILY WITH MEALS PER SLIDING SCALE Patient taking differently: Inject 15-40 Units into the skin See admin instructions. INJECT 15-40 UNITS INTO SKIN THREE TIMES DAILY WITH MEALS, PER SLIDING SCALE 06/16/18  Yes [name], MD  ibuprofen (ADVIL,MOTRIN) 200 MG tablet Take 400 mg by mouth every 8 (eight) hours as needed (for pain).    Yes [provider]  insulin glargine (LANTUS) 100 UNIT/ML injection ADMINISTER 50 UNITS UNDER THE SKIN TWICE DAILY Patient taking differently: Inject 50 Units into the skin 2 (two) times daily with a meal.  06/16/18  Yes Pickard, Cammie Mcgee, MD  liraglutide (VICTOZA) 18 MG/3ML SOPN INJECT 1.2 MG UNDER THE SKIN ONCE DAILY Patient taking differently: Inject 1.2 mg into the skin daily before breakfast.  09/14/18  Yes [name], MD  lisinopril (PRINIVIL,ZESTRIL) 20 MG tablet Take 2 tablets (40 mg total) by mouth  daily. Patient taking differently: Take 20 mg by mouth 2 (two) times daily.  06/16/18  Yes [name], MD  meloxicam (MOBIC) 7.5 MG tablet TAKE 1 TABLET BY MOUTH DAILY AS NEEDED FOR PAIN AND IMFLAMMATION Patient taking differently: Take 7.5 mg by mouth daily as needed (for pain or inflammation).  08/01/18  Yes [name], MD  metFORMIN (GLUCOPHAGE) 500 MG tablet TAKE 2 TABLETS(1000 MG) BY MOUTH TWICE DAILY Patient taking differently: Take 1,000 mg by mouth 2 (two) times daily with a meal.  06/23/18  Yes [name], MD  metoprolol succinate (TOPROL-XL) 25 MG 24 hr tablet TAKE 1 TABLET(25 MG) BY MOUTH DAILY Patient taking differently: Take 25 mg by mouth at bedtime.  09/09/18  Yes [name], MD  omega-3 acid ethyl esters (LOVAZA) 1 g capsule TAKE 2 CAPSULES BY MOUTH TWICE DAILY Patient taking differently: Take 1 g by mouth 2 (two) times daily.  06/16/18  Yes [name], MD  pioglitazone (ACTOS) 30 MG tablet TAKE 1 TABLET(30 MG) BY MOUTH DAILY Patient taking differently: Take 30 mg by mouth daily before breakfast.  06/16/18  Yes Pickard, Cammie Mcgee, MD  pregabalin (LYRICA) 150 MG capsule TAKE 1 CAPSULE BY MOUTH TWICE A DAY Patient taking differently: Take 150 mg by mouth at bedtime.  09/19/18  Yes [name], MD  rosuvastatin (CRESTOR) 40 MG tablet TAKE 1/2 TABLET BY MOUTH AT BEDTIME Patient taking differently: Take 20 mg by mouth at bedtime.  06/23/18  Yes [name], MD  glucose blood (CONTOUR NEXT TEST) test strip USE AS DIRECTED TO CHECK BLOOD SUGAR FOUR TIMES DAILY.DX E11.9 06/22/18   [name], MD  Insulin Pen Needle 31G X 8 MM MISC Use 1 daily 06/16/18   [name], MD  Insulin Syringe-Needle U-100 (B-D INS SYRINGE 0.5CC/30GX1/2") 30G X 1/2" 0.5 ML MISC USE WITH INSULIN 5 TIMES A DAY AS DIRECTED 06/16/18   [name], MD  meloxicam (MOBIC) 7.5 MG tablet TAKE 1 TABLET BY MOUTH DAILY AS NEEDED FOR PAIN AND IMFLAMMATION Patient  not taking: Reported on 09/22/2018 06/16/18   [name], MD  mometasone (ELOCON) 0.1 % cream Apply 1 application topically daily. Patient not taking: Reported on 09/22/2018 04/05/17   [name], MD  pioglitazone (ACTOS) 30 MG tablet TAKE 1 TABLET(30 MG) BY MOUTH DAILY Patient not taking: Reported on 09/22/2018 07/25/18   [name], MD    Family History No family history on file.  Social History Social History   Tobacco Use  . Smoking status: Former Research scientist (life sciences)  . Smokeless tobacco: Former Network engineer Use Topics  . Alcohol use: Yes    Comment: 1 glass of wine a month or less  . Drug use: No  Allergies   Patient has no known allergies.   Review of Systems Review of Systems  Constitutional: Positive for activity change, diaphoresis and fatigue. Negative for chills and fever.  HENT: Negative for ear pain and sore throat.   Eyes: Negative for pain and visual disturbance.  Respiratory: Positive for chest tightness and shortness of breath. Negative for cough.   Cardiovascular: Positive for chest pain. Negative for palpitations.  Gastrointestinal: Negative for abdominal pain and vomiting.  Genitourinary: Negative for dysuria and hematuria.  Musculoskeletal: Negative for arthralgias and back pain.  Skin: Negative for color change and rash.  Neurological: Negative for seizures and syncope.  All other systems reviewed and are negative.    Physical Exam Updated Vital Signs BP (!) 153/70 (BP Location: Left Arm)   Pulse 95   Temp 98.7 F (37.1 C) (Oral)   Resp (!) 9   Ht 6\' 1"  (1.854 m)   Wt (!) 140.2 kg   SpO2 97%   BMI 40.78 kg/m   Physical Exam Vitals signs and nursing note reviewed.  Constitutional:      Appearance: Normal appearance. He is well-developed. He is obese. He is not ill-appearing.  HENT:     Head: Normocephalic and atraumatic.  Eyes:     Conjunctiva/sclera: Conjunctivae normal.  Neck:     Musculoskeletal: Neck supple.    Cardiovascular:     Rate and Rhythm: Normal rate and regular rhythm.     Heart sounds: S1 normal and S2 normal. No murmur.  Pulmonary:     Effort: Pulmonary effort is normal. No respiratory distress.     Breath sounds: Normal breath sounds.  Abdominal:     Palpations: Abdomen is soft.     Tenderness: There is no abdominal tenderness.  Skin:    General: Skin is warm and dry.  Neurological:     General: No focal deficit present.     Mental Status: He is alert and oriented to person, place, and time.     GCS: GCS eye subscore is 4. GCS verbal subscore is 5. GCS motor subscore is 6.     Cranial Nerves: Cranial nerves are intact.     Sensory: Sensation is intact.     Motor: Motor function is intact.     Coordination: Coordination is intact.  Psychiatric:        Behavior: Behavior is cooperative.      ED Treatments / Results  Labs (all labs ordered are listed, but only abnormal results are displayed) Labs Reviewed  BASIC METABOLIC PANEL - Abnormal; Notable for the following components:      Result Value   Glucose, Bld 105 (*)    All other components within normal limits  CBC - Abnormal; Notable for the following components:   WBC 15.3 (*)    All other components within normal limits  GLUCOSE, CAPILLARY - Abnormal; Notable for the following components:   Glucose-Capillary 195 (*)    All other components within normal limits  MRSA PCR SCREENING  TROPONIN I  TROPONIN I  TROPONIN I  HEMOGLOBIN A1C  URINALYSIS, ROUTINE W REFLEX MICROSCOPIC  CBC  LIPID PANEL  HIV ANTIBODY (ROUTINE TESTING W REFLEX)  I-STAT TROPONIN, ED  I-STAT TROPONIN, ED    EKG EKG Interpretation  Date/Time:  Thursday September 22 2018 14:39:24 EST Ventricular Rate:  67 PR Interval:  182 QRS Duration: 96 QT Interval:  410 QTC Calculation: 433 R Axis:   71 Text Interpretation:  Normal sinus rhythm Normal ECG No  acute changes No old tracing to compare Confirmed by Varney Biles [phone]) on 09/22/2018  5:37:05 PM   Radiology Dg Chest 2 View  Result Date: 09/22/2018 CLINICAL DATA:  Left chest pain EXAM: CHEST - 2 VIEW COMPARISON:  July 22 2012 FINDINGS: The heart size and mediastinal contours are within normal limits. Both lungs are clear. The visualized skeletal structures are stable. IMPRESSION: No active cardiopulmonary disease. Electronically Signed   By: Abelardo Diesel M.D.   On: 09/22/2018 15:06    Procedures Procedures (including critical care time)  Medications Ordered in ED Medications  nitroGLYCERIN 50 mg in dextrose 5 % 250 mL (0.2 mg/mL) infusion (25 mcg/min Intravenous Transfusing/Transfer 09/22/18 2111)  aspirin EC tablet 162 mg (has no administration in time range)  ezetimibe (ZETIA) tablet 10 mg (has no administration in time range)  omega-3 acid ethyl esters (LOVAZA) capsule 1 g (has no administration in time range)  rosuvastatin (CRESTOR) tablet 20 mg (has no administration in time range)  insulin glargine (LANTUS) injection 50 Units (has no administration in time range)  pregabalin (LYRICA) capsule 150 mg (has no administration in time range)  acetaminophen (TYLENOL) tablet 650 mg (has no administration in time range)  ondansetron (ZOFRAN) injection 4 mg (has no administration in time range)  enoxaparin (LOVENOX) injection 70 mg (has no administration in time range)  0.9 %  sodium chloride infusion (has no administration in time range)  insulin aspart (novoLOG) injection 0-9 Units (has no administration in time range)  insulin aspart (novoLOG) injection 0-5 Units (has no administration in time range)  sodium chloride flush (NS) 0.9 % injection 3 mL (3 mLs Intravenous Given 09/22/18 1812)  aspirin chewable tablet 324 mg (324 mg Oral Given 09/22/18 1809)     Initial Impression / Assessment and Plan / ED Course  I have reviewed the triage vital signs and the nursing notes.  Pertinent labs & imaging results that were available during my care of the patient were  reviewed by me and considered in my medical decision making (see chart for details).     MDM:  Imaging: Chest x-ray reviewed by me showing no acute cardiac or pulmonary abnormality.  ED Provider Interpretation of EKG: Sinus rhythm with a rate of 67 bpm, normal axis, no ST segment elevation or depressions, pathologic T wave changes or significant interval irregularity.  No bundle-branch blocks.  No delta wave.  No evidence for Brugada syndrome.  Labs: CBC with a white count of 15 otherwise normal, BMP normal, troponin 0.01, delta troponin less than 0.03.  On initial evaluation, patient appears well. Afebrile and hemodynamically stable. Alert and oriented x4, pleasant, and cooperative.  Presents with chest pain as detailed above.  On exam, there is no focal abnormality. No history of shingles, no rash, and low suspicion for Zoster at this time. No tenderness to palpation and no recent falls or trauma. EKG shows no evidence for acute ischemia or arrhythmia. Given ASA full dose in the ED and nitro x 1 with further improvement.  Started on low-dose nitro drip. Troponin negative as above. HEAR 6.  His story is particularly concerning for typical chest pain and represents high risk for cardiac event.  Labs also with leukocytosis without infectious signs or symptoms.  Potentially concerning for demargination in the setting of unstable angina.  Based on EKG and hx I have low suspicion for pericarditis. No evidence for myocarditis. CXR without evidence for pneumonia, pneumothorax, or acute bony abnormality. No fever or leukocytosis and low suspicion  for acute infectious etiology. No back pain, pulse discrepancy, or neuro deficit. Doubt aortic dissection.   No history of CHF. BNP not. No orthopnea, PND, and no LE edema. Low suspicion for CHF exacerbation.  Low suspicion for PE at this time.  Patient admitted to the hospitalist service for further testing and management in stable condition.   The plan for  this patient was discussed with Dr. Kathrynn Humble who voiced agreement and who oversaw evaluation and treatment of this patient.   The patient was fully informed and involved with the history taking, evaluation, workup including labs/images, and plan. The patient's concerns and questions were addressed to the patient's satisfaction and he expressed agreement with the plan to admit.    Final Clinical Impressions(s) / ED Diagnoses   Final diagnoses:  Chest pain, unspecified type  Angina pectoris [facility])    ED Discharge Orders    None       Generoso Cropper, Rodena Goldmann, MD 09/22/18 2144    Varney Biles, MD 09/23/18 1245

## 2018-09-23 ENCOUNTER — Encounter (HOSPITAL_COMMUNITY)
Admission: EM | Disposition: A | Discharge status: Home / Self Care | Payer: Self-pay | Source: Home / Self Care | Attending: Internal Medicine

## 2018-09-23 ENCOUNTER — Encounter (HOSPITAL_COMMUNITY): Payer: Self-pay | Admitting: Physician Assistant

## 2018-09-23 ENCOUNTER — Other Ambulatory Visit: Payer: Self-pay

## 2018-09-23 ENCOUNTER — Other Ambulatory Visit: Payer: Self-pay | Admitting: Physician Assistant

## 2018-09-23 ENCOUNTER — Other Ambulatory Visit (HOSPITAL_COMMUNITY): Payer: Medicare Other

## 2018-09-23 DIAGNOSIS — E7800 Pure hypercholesterolemia, unspecified: Secondary | ICD-10-CM

## 2018-09-23 DIAGNOSIS — I200 Unstable angina: Secondary | ICD-10-CM | POA: Diagnosis present

## 2018-09-23 DIAGNOSIS — I4891 Unspecified atrial fibrillation: Secondary | ICD-10-CM | POA: Diagnosis not present

## 2018-09-23 DIAGNOSIS — I480 Paroxysmal atrial fibrillation: Secondary | ICD-10-CM

## 2018-09-23 DIAGNOSIS — E119 Type 2 diabetes mellitus without complications: Secondary | ICD-10-CM | POA: Diagnosis not present

## 2018-09-23 DIAGNOSIS — R079 Chest pain, unspecified: Secondary | ICD-10-CM | POA: Diagnosis not present

## 2018-09-23 DIAGNOSIS — I10 Essential (primary) hypertension: Secondary | ICD-10-CM | POA: Diagnosis not present

## 2018-09-23 DIAGNOSIS — I209 Angina pectoris, unspecified: Secondary | ICD-10-CM | POA: Diagnosis not present

## 2018-09-23 DIAGNOSIS — R072 Precordial pain: Secondary | ICD-10-CM | POA: Diagnosis not present

## 2018-09-23 DIAGNOSIS — D72829 Elevated white blood cell count, unspecified: Secondary | ICD-10-CM | POA: Diagnosis not present

## 2018-09-23 HISTORY — PX: LEFT HEART CATH AND CORONARY ANGIOGRAPHY: CATH118249

## 2018-09-23 LAB — URINALYSIS, ROUTINE W REFLEX MICROSCOPIC
BILIRUBIN URINE: NEGATIVE
Bacteria, UA: NONE SEEN
Glucose, UA: >=500 mg/dL — AB
Hgb urine dipstick: NEGATIVE
Ketones, ur: NEGATIVE mg/dL
Leukocytes, UA: NEGATIVE
Nitrite: NEGATIVE
Protein, ur: NEGATIVE mg/dL
Specific Gravity, Urine: 1.016 (ref 1.005–1.030)
pH: 5 (ref 5.0–8.0)

## 2018-09-23 LAB — GLUCOSE, CAPILLARY
Glucose-Capillary: 197 mg/dL — ABNORMAL HIGH (ref 70–99)
Glucose-Capillary: 188 mg/dL — ABNORMAL HIGH (ref 70–99)
Glucose-Capillary: 189 mg/dL — ABNORMAL HIGH (ref 70–99)
Glucose-Capillary: 290 mg/dL — ABNORMAL HIGH (ref 70–99)

## 2018-09-23 LAB — CBC
HCT: 36.8 % — ABNORMAL LOW (ref 39.0–52.0)
Hemoglobin: 12.4 g/dL — ABNORMAL LOW (ref 13.0–17.0)
MCH: 29.7 pg (ref 26.0–34.0)
MCHC: 33.7 g/dL (ref 30.0–36.0)
MCV: 88 fL (ref 80.0–100.0)
Platelets: 162 10*3/uL (ref 150–400)
RBC: 4.18 MIL/uL — ABNORMAL LOW (ref 4.22–5.81)
RDW: 14.2 % (ref 11.5–15.5)
WBC: 11.2 10*3/uL — ABNORMAL HIGH (ref 4.0–10.5)
nRBC: 0 % (ref 0.0–0.2)

## 2018-09-23 LAB — LIPID PANEL
Cholesterol: 75 mg/dL (ref 0–200)
HDL: 38 mg/dL — ABNORMAL LOW (ref >40)
LDL Cholesterol: 25 mg/dL (ref 0–99)
Total CHOL/HDL Ratio: 2 RATIO
Triglycerides: 61 mg/dL (ref <150)
VLDL: 12 mg/dL (ref 0–40)

## 2018-09-23 LAB — HEMOGLOBIN A1C
Hgb A1c MFr Bld: 7.3 % — ABNORMAL HIGH (ref 4.8–5.6)
Mean Plasma Glucose: 162.81 mg/dL

## 2018-09-23 LAB — TROPONIN I
Troponin I: <0.03 ng/mL (ref <0.03)
Troponin I: <0.03 ng/mL (ref <0.03)

## 2018-09-23 LAB — TSH: TSH: 1.227 u[IU]/mL (ref 0.350–4.500)

## 2018-09-23 LAB — D-DIMER, QUANTITATIVE: D-Dimer, Quant: 0.32 ug/mL-FEU (ref 0.00–0.50)

## 2018-09-23 LAB — HIV ANTIBODY (ROUTINE TESTING W REFLEX): HIV Screen 4th Generation wRfx: NONREACTIVE

## 2018-09-23 LAB — MRSA PCR SCREENING: MRSA by PCR: NEGATIVE

## 2018-09-23 SURGERY — LEFT HEART CATH AND CORONARY ANGIOGRAPHY
Anesthesia: LOCAL

## 2018-09-23 MED ORDER — SODIUM CHLORIDE 0.9 % WEIGHT BASED INFUSION: 3.0000 mL/kg/h | INTRAVENOUS | Status: DC

## 2018-09-23 MED ORDER — FENTANYL CITRATE (PF) 100 MCG/2ML IJ SOLN
INTRAMUSCULAR | Status: DC | PRN
  Administered 2018-09-23: 25 ug via INTRAVENOUS

## 2018-09-23 MED ORDER — MIDAZOLAM HCL 2 MG/2ML IJ SOLN
INTRAMUSCULAR | Status: DC | PRN
  Administered 2018-09-23: 1 mg via INTRAVENOUS

## 2018-09-23 MED ORDER — SODIUM CHLORIDE 0.9 % IV SOLN
INTRAVENOUS | Status: AC
  Administered 2018-09-23: 17:00:00 via INTRAVENOUS

## 2018-09-23 MED ORDER — SODIUM CHLORIDE 0.9% FLUSH: 3.0000 mL | INTRAVENOUS | Status: DC | PRN

## 2018-09-23 MED ORDER — HEPARIN (PORCINE) IN NACL 1000-0.9 UT/500ML-% IV SOLN
INTRAVENOUS | Status: AC
  Filled 2018-09-23: qty 500

## 2018-09-23 MED ORDER — HEPARIN SODIUM (PORCINE) 1000 UNIT/ML IJ SOLN
INTRAMUSCULAR | Status: DC | PRN
  Administered 2018-09-23: 6000 [IU] via INTRAVENOUS

## 2018-09-23 MED ORDER — SODIUM CHLORIDE 0.9 % WEIGHT BASED INFUSION: 1.0000 mL/kg/h | INTRAVENOUS | Status: DC

## 2018-09-23 MED ORDER — DILTIAZEM HCL-DEXTROSE 100-5 MG/100ML-% IV SOLN (PREMIX)
5.0000 mg/h | INTRAVENOUS | Status: DC
  Administered 2018-09-23: 5 mg/h via INTRAVENOUS
  Filled 2018-09-23 (×4): qty 100

## 2018-09-23 MED ORDER — HEPARIN (PORCINE) IN NACL 1000-0.9 UT/500ML-% IV SOLN
INTRAVENOUS | Status: DC | PRN
  Administered 2018-09-23 (×2): 500 mL

## 2018-09-23 MED ORDER — SODIUM CHLORIDE 0.9 % IV SOLN: 250.0000 mL | INTRAVENOUS | Status: DC | PRN

## 2018-09-23 MED ORDER — DILTIAZEM LOAD VIA INFUSION
20.0000 mg | Freq: Once | INTRAVENOUS | Status: AC
  Administered 2018-09-23: 20 mg via INTRAVENOUS
  Filled 2018-09-23: qty 20

## 2018-09-23 MED ORDER — ACETAMINOPHEN 325 MG PO TABS
650.0000 mg | ORAL_TABLET | Freq: Four times a day (QID) | ORAL | Status: DC | PRN
  Administered 2018-09-23 (×2): 650 mg via ORAL
  Filled 2018-09-23 (×2): qty 2

## 2018-09-23 MED ORDER — INSULIN GLARGINE 100 UNIT/ML ~~LOC~~ SOLN: 50.0000 [IU] | Freq: Two times a day (BID) | SUBCUTANEOUS | Status: DC

## 2018-09-23 MED ORDER — LIDOCAINE HCL (PF) 1 % IJ SOLN
INTRAMUSCULAR | Status: DC | PRN
  Administered 2018-09-23: 4 mL

## 2018-09-23 MED ORDER — SODIUM CHLORIDE 0.9% FLUSH: 3.0000 mL | Freq: Two times a day (BID) | INTRAVENOUS | Status: DC

## 2018-09-23 MED ORDER — LIDOCAINE HCL (PF) 1 % IJ SOLN
INTRAMUSCULAR | Status: AC
  Filled 2018-09-23: qty 30

## 2018-09-23 MED ORDER — ENOXAPARIN SODIUM 150 MG/ML ~~LOC~~ SOLN
1.0000 mg/kg | Freq: Two times a day (BID) | SUBCUTANEOUS | Status: DC
  Administered 2018-09-23: 140 mg via SUBCUTANEOUS
  Filled 2018-09-23 (×2): qty 0.92

## 2018-09-23 MED ORDER — IOHEXOL 350 MG/ML SOLN
INTRAVENOUS | Status: DC | PRN
  Administered 2018-09-23: 65 mL via INTRAVENOUS

## 2018-09-23 MED ORDER — HEPARIN (PORCINE) 25000 UT/250ML-% IV SOLN
1550.0000 [IU]/h | INTRAVENOUS | Status: DC
  Administered 2018-09-24: 1550 [IU]/h via INTRAVENOUS
  Filled 2018-09-23: qty 250

## 2018-09-23 MED ORDER — KETOROLAC TROMETHAMINE 15 MG/ML IJ SOLN
15.0000 mg | Freq: Once | INTRAMUSCULAR | Status: AC
  Administered 2018-09-23: 15 mg via INTRAVENOUS
  Filled 2018-09-23: qty 1

## 2018-09-23 MED ORDER — HEPARIN SODIUM (PORCINE) 1000 UNIT/ML IJ SOLN
INTRAMUSCULAR | Status: AC
  Filled 2018-09-23: qty 1

## 2018-09-23 MED ORDER — MIDAZOLAM HCL 2 MG/2ML IJ SOLN
INTRAMUSCULAR | Status: AC
  Filled 2018-09-23: qty 2

## 2018-09-23 MED ORDER — METOPROLOL TARTRATE 25 MG PO TABS
25.0000 mg | ORAL_TABLET | Freq: Two times a day (BID) | ORAL | Status: DC
  Filled 2018-09-23: qty 1

## 2018-09-23 MED ORDER — VERAPAMIL HCL 2.5 MG/ML IV SOLN
INTRAVENOUS | Status: AC
  Filled 2018-09-23: qty 2

## 2018-09-23 MED ORDER — VERAPAMIL HCL 2.5 MG/ML IV SOLN
INTRAVENOUS | Status: DC | PRN
  Administered 2018-09-23: 10 mL via INTRA_ARTERIAL

## 2018-09-23 MED ORDER — SODIUM CHLORIDE 0.9% FLUSH
3.0000 mL | Freq: Two times a day (BID) | INTRAVENOUS | Status: DC
  Administered 2018-09-23: 3 mL via INTRAVENOUS

## 2018-09-23 MED ORDER — INSULIN GLARGINE 100 UNIT/ML ~~LOC~~ SOLN
40.0000 [IU] | Freq: Two times a day (BID) | SUBCUTANEOUS | Status: DC
  Administered 2018-09-23 – 2018-09-24 (×2): 40 [IU] via SUBCUTANEOUS
  Filled 2018-09-23 (×2): qty 0.4

## 2018-09-23 MED ORDER — FENTANYL CITRATE (PF) 100 MCG/2ML IJ SOLN
INTRAMUSCULAR | Status: AC
  Filled 2018-09-23: qty 2

## 2018-09-23 SURGICAL SUPPLY — 11 items
CATH 5FR JL3.5 JR4 ANG PIG MP (CATHETERS) ×2 IMPLANT
DEVICE RAD COMP TR BAND LRG (VASCULAR PRODUCTS) ×2 IMPLANT
GLIDESHEATH SLEND SS 6F .021 (SHEATH) ×2 IMPLANT
GUIDEWIRE INQWIRE 1.5J.035X260 (WIRE) ×1 IMPLANT
HOVERMATT SINGLE USE (MISCELLANEOUS) ×2 IMPLANT
INQWIRE 1.5J .035X260CM (WIRE) ×2
KIT HEART LEFT (KITS) ×2 IMPLANT
PACK CARDIAC CATHETERIZATION (CUSTOM PROCEDURE TRAY) ×2 IMPLANT
SYR MEDRAD MARK 7 150ML (SYRINGE) ×2 IMPLANT
TRANSDUCER W/STOPCOCK (MISCELLANEOUS) ×2 IMPLANT
TUBING CIL FLEX 10 FLL-RA (TUBING) ×2 IMPLANT

## 2018-09-23 NOTE — Progress Notes (Signed)
Normal coronaries on left heart cath 09/23/18. However, patient converted to Afib during the procedure. Dr. Angelena Form recommends heparin starting 8 hr following cath.  In consultation with Dr. Debara Pickett, will plan to transition to eliquis tomorrow morning. Outpatient 30 day event monitor has been ordered, appt details will be arranged and patient will be called. Rate is currently controlled.  Continue with plans to obtain echocardiogram today. Will check a TSH.   Ledora Bottcher, PA-C 09/23/2018, 4:27 PM [place] [address] [place], [place] [address]

## 2018-09-23 NOTE — Care Management Obs Status (Addendum)
[facility] NOTIFICATION   Patient Details  Name: Ronald Dorsey MRN: [id number] Date of Birth: 23-Dec-1950   Medicare Observation Status Notification Given:   Yes    Maryclare Labrador, RN 09/23/2018, 5:04 PM

## 2018-09-23 NOTE — Consult Note (Signed)
Cardiology Consultation:   Patient ID: Ronald Dorsey MRN: [id number]; DOB: 04/03/1951  Admit date: 09/22/2018 Date of Consult: 09/23/2018  Primary Care Provider: [name], MD Primary Cardiologist: Pixie Casino, MD, new Primary Electrophysiologist:  None    Patient Profile:   Ronald Dorsey is a 68 y.o. male with a hx of paroxysmal SVT, HTN, HLD, DM, obesity, former tobacco abuse, and sciatica who is being seen today for the evaluation of chest pain at the request of Dr. Thereasa Solo.  History of Present Illness:   Ronald Dorsey does not currently follow with cardiology (wife sees Dr. Debara Pickett). He recalls having a stress test more than 15 years ago that was normal. He has a PMH as above. He presented to [facility] with complaints of chest pain. On my interview, he states that he started to feel bad on Wed while at work. 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 On Wed, his BP was elevated and he felt "poorly" throughout the day. He felt better and pressure decreased when he took breaks at work, but again elevated with return of feeling "bad" when he started working again. On Thurs, he experienced severe chest tightening that radiated to his neck, back and left face. This was associated with diaphoresis. He quit working and called his wife. On arrival, he took one of his wife's nitro SL tablets which nearly resolved his chest pain. He again experienced chest pain with exertion. They decided to report to [facility]. On arrival, troponin was negative and EKG nonacute. However, given his risk factors and the exertional nature of his chest pain that was relieved with rest and with nitro, he was admitted for further workup. Cardiology was consulted.  Overnight, he was placed on nitro drip which was titrated to 12 mcg. He is now largely chest pain free with nitro drip running. Heparin was not started in the setting of negative troponins.  Leukocytosis with  WBC 11.2 improved from yesterday 15.3. UA negative. CXR negative for infectious process. A1c 7.3%. Normal renal function. Hb 12.4.   Past Medical History:  Diagnosis Date  . Apnea   . Arthritis   . Diabetes mellitus without complication ([place])   . Hypercholesterolemia   . Hypertension   . Neuromuscular disorder (HCC)    sciatica  . PSVT (paroxysmal supraventricular tachycardia) ([place])     Past Surgical History:  Procedure Laterality Date  . CARPAL TUNNEL RELEASE  2010   left wrist  . EYE SURGERY    . TONSILLECTOMY  1961     Home Medications:  Prior to Admission medications   Medication Sig Start Date End Date Taking? Authorizing Provider  amLODipine (NORVASC) 10 MG tablet TAKE 1 TABLET(10 MG) BY MOUTH DAILY Patient taking differently: Take 10 mg by mouth daily.  06/23/18  Yes [name], MD  aspirin (ASPIRIN LOW DOSE) 81 MG EC tablet TAKE 2 TABLETS(162 MG) BY MOUTH AT BEDTIME Patient taking differently: Take 162 mg by mouth at bedtime.  06/16/18  Yes [name], MD  ezetimibe (ZETIA) 10 MG tablet TAKE 1 TABLET(10 MG) BY MOUTH DAILY Patient taking differently: Take 10 mg by mouth at bedtime.  06/16/18  Yes PickardCammie Mcgee, MD  HUMALOG 100 UNIT/ML injection INJECT 40 UNITS INTO SKIN THREE TIMES DAILY WITH MEALS PER SLIDING SCALE Patient taking differently: Inject 15-40 Units into the skin See admin instructions. INJECT 15-40 UNITS INTO SKIN THREE TIMES DAILY WITH MEALS,  PER SLIDING SCALE 06/16/18  Yes [name], MD  ibuprofen (ADVIL,MOTRIN) 200 MG tablet Take 400 mg by mouth every 8 (eight) hours as needed (for pain).    Yes [provider]  insulin glargine (LANTUS) 100 UNIT/ML injection ADMINISTER 50 UNITS UNDER THE SKIN TWICE DAILY Patient taking differently: Inject 50 Units into the skin 2 (two) times daily with a meal.  06/16/18  Yes Pickard, Cammie Mcgee, MD  liraglutide (VICTOZA) 18 MG/3ML SOPN INJECT 1.2 MG UNDER THE SKIN ONCE DAILY Patient  taking differently: Inject 1.2 mg into the skin daily before breakfast.  09/14/18  Yes [name], MD  lisinopril (PRINIVIL,ZESTRIL) 20 MG tablet Take 2 tablets (40 mg total) by mouth daily. Patient taking differently: Take 20 mg by mouth 2 (two) times daily.  06/16/18  Yes [name], MD  meloxicam (MOBIC) 7.5 MG tablet TAKE 1 TABLET BY MOUTH DAILY AS NEEDED FOR PAIN AND IMFLAMMATION Patient taking differently: Take 7.5 mg by mouth daily as needed (for pain or inflammation).  08/01/18  Yes [name], MD  metFORMIN (GLUCOPHAGE) 500 MG tablet TAKE 2 TABLETS(1000 MG) BY MOUTH TWICE DAILY Patient taking differently: Take 1,000 mg by mouth 2 (two) times daily with a meal.  06/23/18  Yes [name], MD  metoprolol succinate (TOPROL-XL) 25 MG 24 hr tablet TAKE 1 TABLET(25 MG) BY MOUTH DAILY Patient taking differently: Take 25 mg by mouth at bedtime.  09/09/18  Yes [name], MD  omega-3 acid ethyl esters (LOVAZA) 1 g capsule TAKE 2 CAPSULES BY MOUTH TWICE DAILY Patient taking differently: Take 1 g by mouth 2 (two) times daily.  06/16/18  Yes [name], MD  pioglitazone (ACTOS) 30 MG tablet TAKE 1 TABLET(30 MG) BY MOUTH DAILY Patient taking differently: Take 30 mg by mouth daily before breakfast.  06/16/18  Yes Pickard, Cammie Mcgee, MD  pregabalin (LYRICA) 150 MG capsule TAKE 1 CAPSULE BY MOUTH TWICE A DAY Patient taking differently: Take 150 mg by mouth at bedtime.  09/19/18  Yes [name], MD  rosuvastatin (CRESTOR) 40 MG tablet TAKE 1/2 TABLET BY MOUTH AT BEDTIME Patient taking differently: Take 20 mg by mouth at bedtime.  06/23/18  Yes [name], MD  glucose blood (CONTOUR NEXT TEST) test strip USE AS DIRECTED TO CHECK BLOOD SUGAR FOUR TIMES DAILY.DX E11.9 06/22/18   [name], MD  Insulin Pen Needle 31G X 8 MM MISC Use 1 daily 06/16/18   [name], MD  Insulin Syringe-Needle U-100 (B-D INS SYRINGE 0.5CC/30GX1/2") 30G X 1/2" 0.5 ML  MISC USE WITH INSULIN 5 TIMES A DAY AS DIRECTED 06/16/18   [name], MD  meloxicam (MOBIC) 7.5 MG tablet TAKE 1 TABLET BY MOUTH DAILY AS NEEDED FOR PAIN AND IMFLAMMATION Patient not taking: Reported on 09/22/2018 06/16/18   [name], MD  mometasone (ELOCON) 0.1 % cream Apply 1 application topically daily. Patient not taking: Reported on 09/22/2018 04/05/17   [name], MD  pioglitazone (ACTOS) 30 MG tablet TAKE 1 TABLET(30 MG) BY MOUTH DAILY Patient not taking: Reported on 09/22/2018 07/25/18   [name], MD    Inpatient Medications: Scheduled Meds: . aspirin EC  162 mg Oral QHS  . enoxaparin (LOVENOX) injection  1 mg/kg Subcutaneous BID  . ezetimibe  10 mg Oral QHS  . insulin aspart  0-5 Units Subcutaneous QHS  . insulin aspart  0-9 Units Subcutaneous TID WC  . insulin  glargine  40 Units Subcutaneous BID WC  . omega-3 acid ethyl esters  1 g Oral BID  . pregabalin  150 mg Oral QHS  . rosuvastatin  20 mg Oral QHS   Continuous Infusions: . nitroGLYCERIN 40 mcg/min (09/23/18 0500)   PRN Meds: acetaminophen, ondansetron (ZOFRAN) IV  Allergies:   No Known Allergies  Social History:   Social History   Socioeconomic History  . Marital status: Married    Spouse name: Not on file  . Number of children: Not on file  . Years of education: Not on file  . Highest education level: Not on file  Occupational History  . Not on file  Social Needs  . Financial resource strain: Not on file  . Food insecurity:    Worry: Not on file    Inability: Not on file  . Transportation needs:    Medical: Not on file    Non-medical: Not on file  Tobacco Use  . Smoking status: Former Research scientist (life sciences)  . Smokeless tobacco: Former Network engineer and Sexual Activity  . Alcohol use: Yes    Comment: 1 glass of wine a month or less  . Drug use: No  . 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 Physical activity:    Days per week: Not on file     Minutes per session: Not on file  . Stress: Not on file  Relationships  . Social connections:    Talks on phone: Not on file    Gets together: Not on file    Attends religious service: Not on file    Active member of club or organization: Not on file    Attends meetings of clubs or organizations: Not on file    Relationship status: Not on file  . Intimate partner violence:    Fear of current or ex partner: Not on file    Emotionally abused: Not on file    Physically abused: Not on file    Forced sexual activity: Not on file  Other Topics Concern  . Not on file  Social History Narrative  . Not on file    Family History:   Family History  Problem Relation Age of Onset  . Hypertension Father      ROS:  Please see the history of present illness.   All other ROS reviewed and negative.     Physical Exam/Data:   Vitals:   09/23/18 0600 09/23/18 0719 09/23/18 0800 09/23/18 1149  BP: (!) 159/65 (!) 150/76 (!) 145/68 140/68  Pulse: 87 93 81 87  Resp: 20 15 15 17   Temp:  98.1 F (36.7 C)  98.5 F (36.9 C)  TempSrc:  Oral  Oral  SpO2: 95% 95% 94% 95%  Weight:      Height:        Intake/Output Summary (Last 24 hours) at 09/23/2018 1341 Last data filed at 09/23/2018 1200 Gross per 24 hour  Intake 1667.11 ml  Output 900 ml  Net 767.11 ml   Last 3 Weights 09/23/2018 09/22/2018 09/22/2018  Weight (lbs) 304 lb 10.8 oz 309 lb 1.4 oz 300 lb  Weight (kg) 138.2 kg 140.2 kg 136.079 kg     Body mass index is 40.2 kg/m.  General:  Well nourished, well developed, in no acute distress HEENT: normal Neck: no JVD Vascular: No carotid bruits Cardiac:  normal S1, S2; RRR; no murmur Lungs:  clear to auscultation bilaterally, no wheezing, rhonchi  or rales  Abd: soft, nontender, no hepatomegaly  Ext: trace edema Musculoskeletal:  No deformities, BUE and BLE strength normal and equal Skin: warm and dry  Neuro:  CNs 2-12 intact, no focal abnormalities noted Psych:  Normal affect    EKG:  The EKG was personally reviewed and demonstrates:  sinus Telemetry:  Telemetry was personally reviewed and demonstrates:  sinus  Relevant CV Studies:  none  Laboratory Data:  Chemistry Recent Labs  Lab 09/22/18 1459  NA 136  K 4.6  CL 103  CO2 23  GLUCOSE 105*  BUN 16  CREATININE 1.05  CALCIUM 9.6  GFRNONAA >60  GFRAA >60  ANIONGAP 10    No results for input(s): PROT, ALBUMIN, AST, ALT, ALKPHOS, BILITOT in the last 168 hours. Hematology Recent Labs  Lab 09/22/18 1459 09/23/18 0726  WBC 15.3* 11.2*  RBC 4.98 4.18*  HGB 14.3 12.4*  HCT 45.1 36.8*  MCV 90.6 88.0  MCH 28.7 29.7  MCHC 31.7 33.7  RDW 14.0 14.2  PLT 199 162   Cardiac Enzymes Recent Labs  Lab 09/22/18 1925 09/23/18 0218 09/23/18 0726  TROPONINI <0.03 <0.03 <0.03    Recent Labs  Lab 09/22/18 1500 09/22/18 1815  TROPIPOC 0.01 0.00    BNPNo results for input(s): BNP, PROBNP in the last 168 hours.  DDimer  Recent Labs  Lab 09/23/18 1204  DDIMER 0.32    Radiology/Studies:  Dg Chest 2 View  Result Date: 09/22/2018 CLINICAL DATA:  Left chest pain EXAM: CHEST - 2 VIEW COMPARISON:  July 22 2012 FINDINGS: The heart size and mediastinal contours are within normal limits. Both lungs are clear. The visualized skeletal structures are stable. IMPRESSION: No active cardiopulmonary disease. Electronically Signed   By: Abelardo Diesel M.D.   On: 09/22/2018 15:06    Assessment and Plan:   1. Chest pain - troponin x 4 negative - EKG sinus rhythm without signs of ischemia - risk factors for ACS include hx of tobacco use (quit at age 87), HTN, HLD, obesity, and DM - family history negative for heart disease - patient describes chest tightness with exertion, relieved with rest and nitro, accompanied by diaphoresis with radiation to neck, left face, and back - nitro drip overnight required up-titration to control chest pain - given his overall clinical picture with risk factors, recommend  ischemic evaluation for CAD - will proceed with cardiac catheterization today - will obtain echocardiogram prior to discharge   2. HTN - home meds include lisinopril 20 mg, torpol 25 mg daily, and norvasc - pressures have been moderately elevated here, home medications have been held   3. HLD - 09/23/2018: Cholesterol 75; HDL 38; LDL Cholesterol 25; Triglycerides 61; VLDL 12 - continue zetia and crestor   4. DM - A1c 7.3% this admission - per primary      For questions or updates, please contact [place] Please consult [email or web address] for contact info under     Signed, Ledora Bottcher, PA  09/23/2018 1:41 PM

## 2018-09-23 NOTE — Progress Notes (Signed)
Right radial level 0.  TR band removed and pressure dressing applied.  Pt tolerated well.  Elevated right radial.  Restricted armband on.  Educated pt.   Will continue to monitor. Saunders Revel T

## 2018-09-23 NOTE — Progress Notes (Signed)
   Notified patient had episode atrial fibrillation with RVR. Reviewed telemetry. Patient had brief episode of heart rate in the 140's to 150's. Patient said this was when he was standing up to go to the restroom. Patient asymptomatic at that time. Heart rate now in the high 90's to low 100's at rest. Patient on Toprol 25mg  daily at home but not on any beta-blocker here. Will add Lopressor 25mg  twice daily for now.  Darreld Mclean, PA-C 09/23/2018 5:42 PM

## 2018-09-23 NOTE — Progress Notes (Signed)
I have reviewed the consult note by Doreene Adas, PA and have discussed the cath with the patient  Ronald Dorsey 09/23/2018 3:34 PM

## 2018-09-23 NOTE — Progress Notes (Signed)
[facility] TEAM 1 - Stepdown/ICU [place]  RUE:[id number] DOB: 1951-02-23 DOA: 09/22/2018 PCP: [name], MD    Brief Narrative:  68 y.o. male with a hx of DM, HTN, HLD who presented w/ chest pain. 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 He took 1 of his wife's nitroglycerin tablets which relieved 90% of his chest pain. The pain resolved fully when he was started on a nitro gtt in the ED.   Significant Events: 1/30 admit   Subjective: The patient is resting comfortably in his hospital bed at this time.  He reports some low-grade pressure type chest pain intermittently but overall states he feels much better since the nitroglycerin drip was started.  Full history reports a day prior to his admission that the patient was experiencing early fatigue symptoms which would improve when he would stop working and rest for a bit.  This was new for him.  The symptoms are all very worrisome for unstable angina.  Assessment & Plan:  Chest pain Troponin x3 negative - EKG without acute ischemic changes - sx worrisome for angina - Cards consulted - suspect cath will be indicated   Leukocytosis Afebrile - no hx suggestive of pneumonia - nontoxic-appearing - UA unremarkable - likely stress demargination   Hypertension No changes in tx at this time - follow trend   Hyperlipidemia Continue home statin, Zetia, Lovaza - LDL 25  DM2 A1c 7.3  DVT prophylaxis: lovenox  Code Status: FULL CODE Family Communication: spoke w/ wife at bedside  Disposition Plan: await Cards eval   Consultants:  [facility]   Antimicrobials:  none  Objective: Blood pressure (!) 145/68, pulse 81, temperature 98.1 F (36.7 C), temperature source Oral, resp. rate 15, height 6\' 1"  (1.854 m), weight (!) 138.2 kg, SpO2 94 %.  Intake/Output Summary (Last 24 hours) at 09/23/2018 1025 Last data  filed at 09/23/2018 0800 Gross per 24 hour  Intake 1631.11 ml  Output 900 ml  Net 731.11 ml   Filed Weights   09/22/18 1437 09/22/18 2128 09/23/18 0552  Weight: 136.1 kg (!) 140.2 kg (!) 138.2 kg    Examination: General: No acute respiratory distress Lungs: Clear to auscultation bilaterally without wheezes or crackles Cardiovascular: Regular rate and rhythm without murmur gallop or rub normal S1 and S2 Abdomen: Nontender, nondistended, soft, bowel sounds positive, no rebound, no ascites, no appreciable mass Extremities: trace B LE edema   CBC: Recent Labs  Lab 09/22/18 1459 09/23/18 0726  WBC 15.3* 11.2*  HGB 14.3 12.4*  HCT 45.1 36.8*  MCV 90.6 88.0  PLT 199 147   Basic Metabolic Panel: Recent Labs  Lab 09/22/18 1459  NA 136  K 4.6  CL 103  CO2 23  GLUCOSE 105*  BUN 16  CREATININE 1.05  CALCIUM 9.6   GFR: Estimated Creatinine Clearance: 99.7 mL/min (by C-G formula based on SCr of 1.05 mg/dL).  Liver Function Tests: No results for input(s): AST, ALT, ALKPHOS, BILITOT, PROT, ALBUMIN in the last 168 hours. No results for input(s): LIPASE, AMYLASE in the last 168 hours. No results for input(s): AMMONIA in the last 168 hours.  Coagulation Profile: No results for input(s): INR, PROTIME in the last 168 hours.  Cardiac Enzymes: Recent Labs  Lab 09/22/18 1925 09/23/18 0218 09/23/18 0726  TROPONINI <0.03 <0.03 <0.03    HbA1C: Hgb A1c MFr Bld  Date/Time Value Ref Range Status  09/23/2018 02:18 AM 7.3 (H) 4.8 - 5.6 % Final    Comment:    (NOTE) Pre diabetes:          5.7%-6.4% Diabetes:              >6.4% Glycemic control for   <7.0% adults with diabetes   06/16/2018 08:26 AM 7.8 (H) <5.7 % of total Hgb Final    Comment:    For someone without known diabetes, a hemoglobin A1c value of 6.5% or greater indicates that they may have  diabetes and this should be confirmed with a follow-up  test. . For someone with known diabetes, a value <7% indicates    that their diabetes is well controlled and a value  greater than or equal to 7% indicates suboptimal  control. A1c targets should be individualized based on  duration of diabetes, age, comorbid conditions, and  other considerations. . Currently, no consensus exists regarding use of hemoglobin A1c for diagnosis of diabetes for children. .     CBG: Recent Labs  Lab 09/22/18 2131 09/23/18 0721  GLUCAP 195* 197*    Recent Results (from the past 240 hour(s))  MRSA PCR Screening     Status: None   Collection Time: 09/22/18  9:32 PM  Result Value Ref Range Status   MRSA by PCR NEGATIVE NEGATIVE Final    Comment:        The GeneXpert MRSA Assay (FDA approved for NASAL specimens only), is one component of a comprehensive MRSA colonization surveillance program. It is not intended to diagnose MRSA infection nor to guide or monitor treatment for MRSA infections. Performed at [facility], [place] [address] [place], [place] [address]      Scheduled Meds: . aspirin EC  162 mg Oral QHS  . enoxaparin (LOVENOX) injection  70 mg Subcutaneous Q24H  . ezetimibe  10 mg Oral QHS  . insulin aspart  0-5 Units Subcutaneous QHS  . insulin aspart  0-9 Units Subcutaneous TID WC  . insulin glargine  50 Units Subcutaneous BID WC  . omega-3 acid ethyl esters  1 g Oral BID  . pregabalin  150 mg Oral QHS  . rosuvastatin  20 mg Oral QHS   Continuous Infusions: . nitroGLYCERIN 40 mcg/min (09/23/18 0500)     LOS: 0 days   Cherene Altes, MD [facility] Office  [phone] Pager - Text Page per Amion  If 7PM-7AM, please contact night-coverage per Amion 09/23/2018, 10:25 AM

## 2018-09-23 NOTE — Progress Notes (Signed)
ANTICOAGULATION CONSULT NOTE - Initial Consult  Pharmacy Consult for lovenox Indication: chest pain/ACS  No Known Allergies  Patient Measurements: Height: 6\' 1"  (185.4 cm) Weight: (!) 304 lb 10.8 oz (138.2 kg) IBW/kg (Calculated) : 79.9   Vital Signs: Temp: 98.1 F (36.7 C) (01/31 0719) Temp Source: Oral (01/31 0719) BP: 145/68 (01/31 0800) Pulse Rate: 81 (01/31 0800)  Labs: Recent Labs    09/22/18 1459 09/22/18 1925 09/23/18 0218 09/23/18 0726  HGB 14.3  --   --  12.4*  HCT 45.1  --   --  36.8*  PLT 199  --   --  162  CREATININE 1.05  --   --   --   TROPONINI  --  <0.03 <0.03 <0.03    Estimated Creatinine Clearance: 99.7 mL/min (by C-G formula based on SCr of 1.05 mg/dL).   Medical History: Past Medical History:  Diagnosis Date  . Apnea   . Arthritis   . Diabetes mellitus without complication ([place])   . Hypercholesterolemia   . Hypertension   . Neuromuscular disorder (HCC)    sciatica  . PSVT (paroxysmal supraventricular tachycardia) [facility])      Assessment: 68 year old male admitted for possible ACS, was given prophylactic lovenox last night, per TRH will increase to full dose this morning. He had not received morning dose, will change dose and give now. Hgb down slightly from 14.3 on admission to 12.4 this am. No bleeding issues noted.   Goal of Therapy:  Anti-Xa level 0.6-1 units/ml 4hrs after LMWH dose given Monitor platelets by anticoagulation protocol: Yes   Plan:  Lovenox 140mg  q12 hours Check cbc in am then q72 hours  Erin Hearing PharmD., BCPS Clinical Pharmacist 09/23/2018 11:43 AM

## 2018-09-23 NOTE — Interval H&P Note (Signed)
History and Physical Interval Note:  09/23/2018 3:34 PM  Ronald Dorsey  has presented today for surgery, with the diagnosis of UA  The various methods of treatment have been discussed with the patient and family. After consideration of risks, benefits and other options for treatment, the patient has consented to  Procedure(s): LEFT HEART CATH AND CORONARY ANGIOGRAPHY (N/A) as a surgical intervention .  The patient's history has been reviewed, patient examined, no change in status, stable for surgery.  I have reviewed the patient's chart and labs.  Questions were answered to the patient's satisfaction.    Cath Lab Visit (complete for each Cath Lab visit)  Clinical Evaluation Leading to the Procedure:   ACS: No.  Non-ACS:    Anginal Classification: CCS III  Anti-ischemic medical therapy: No Therapy  Non-Invasive Test Results: No non-invasive testing performed  Prior CABG: No previous CABG         Lauree Chandler

## 2018-09-23 NOTE — Plan of Care (Signed)

## 2018-09-23 NOTE — Progress Notes (Signed)
ANTICOAGULATION CONSULT NOTE - Initial Consult  Pharmacy Consult for heparin Indication: chest pain/ACS  No Known Allergies  Patient Measurements: Height: 6\' 1"  (185.4 cm) Weight: (!) 304 lb 10.8 oz (138.2 kg) IBW/kg (Calculated) : 79.9   Vital Signs: Temp: 98.5 F (36.9 C) (01/31 1149) Temp Source: Oral (01/31 1149) BP: 119/74 (01/31 1622) Pulse Rate: 99 (01/31 1622)  Labs: Recent Labs    09/22/18 1459 09/22/18 1925 09/23/18 0218 09/23/18 0726  HGB 14.3  --   --  12.4*  HCT 45.1  --   --  36.8*  PLT 199  --   --  162  CREATININE 1.05  --   --   --   TROPONINI  --  <0.03 <0.03 <0.03    Estimated Creatinine Clearance: 99.7 mL/min (by C-G formula based on SCr of 1.05 mg/dL).   Medical History: Past Medical History:  Diagnosis Date  . Apnea   . Arthritis   . Diabetes mellitus without complication ([place])   . Hypercholesterolemia   . Hypertension   . Neuromuscular disorder (HCC)    sciatica  . PSVT (paroxysmal supraventricular tachycardia) [facility])      Assessment: 68 year old male admitted for possible ACS, was given prophylactic lovenox last night, per TRH will increase to full dose lovenox this morning.  S/p cath, pharmacy asked to change to IV heparin for new onset afib.  Last Lovenox dose today at 1245 PM.  Asked to start heparin 8 hrs after sheath pull, which occurred at 1600 pm.    Goal of Therapy:  Heparin level 0.3-0.7 Monitor platelets by anticoagulation protocol: Yes   Plan:  Start IV heparin at rate of 1550 units/hr at 0100 AM 2/1. Check heparin level 6 hrs after gtt starts Daily heparin level and CBC. F/u plans for oral anticoagulation.  Marguerite Olea, [facility] Clinical Pharmacist Phone [phone]  09/23/2018 4:50 PM

## 2018-09-23 NOTE — H&P (View-Only) (Signed)
I have reviewed the consult note by Doreene Adas, PA and have discussed the cath with the patient  Ronald Dorsey 09/23/2018 3:34 PM

## 2018-09-24 ENCOUNTER — Observation Stay (HOSPITAL_BASED_OUTPATIENT_CLINIC_OR_DEPARTMENT_OTHER): Payer: Medicare Other

## 2018-09-24 DIAGNOSIS — R072 Precordial pain: Secondary | ICD-10-CM | POA: Diagnosis not present

## 2018-09-24 DIAGNOSIS — E7800 Pure hypercholesterolemia, unspecified: Secondary | ICD-10-CM | POA: Diagnosis not present

## 2018-09-24 DIAGNOSIS — I200 Unstable angina: Secondary | ICD-10-CM | POA: Diagnosis not present

## 2018-09-24 DIAGNOSIS — R079 Chest pain, unspecified: Secondary | ICD-10-CM | POA: Diagnosis not present

## 2018-09-24 DIAGNOSIS — R0789 Other chest pain: Secondary | ICD-10-CM

## 2018-09-24 DIAGNOSIS — I10 Essential (primary) hypertension: Secondary | ICD-10-CM | POA: Diagnosis not present

## 2018-09-24 DIAGNOSIS — E119 Type 2 diabetes mellitus without complications: Secondary | ICD-10-CM | POA: Diagnosis not present

## 2018-09-24 LAB — ECHOCARDIOGRAM COMPLETE
Height: 73 in
WEIGHTICAEL: 4874.81 [oz_av]

## 2018-09-24 LAB — CBC
HCT: 40.2 % (ref 39.0–52.0)
Hemoglobin: 13.1 g/dL (ref 13.0–17.0)
MCH: 28.6 pg (ref 26.0–34.0)
MCHC: 32.6 g/dL (ref 30.0–36.0)
MCV: 87.8 fL (ref 80.0–100.0)
Platelets: 142 10*3/uL — ABNORMAL LOW (ref 150–400)
RBC: 4.58 MIL/uL (ref 4.22–5.81)
RDW: 14.1 % (ref 11.5–15.5)
WBC: 8.5 10*3/uL (ref 4.0–10.5)
nRBC: 0 % (ref 0.0–0.2)

## 2018-09-24 LAB — COMPREHENSIVE METABOLIC PANEL
ALT: 22 U/L (ref 0–44)
AST: 17 U/L (ref 15–41)
Albumin: 3.3 g/dL — ABNORMAL LOW (ref 3.5–5.0)
Alkaline Phosphatase: 43 U/L (ref 38–126)
Anion gap: 10 (ref 5–15)
BUN: 14 mg/dL (ref 8–23)
CALCIUM: 9 mg/dL (ref 8.9–10.3)
CO2: 22 mmol/L (ref 22–32)
Chloride: 106 mmol/L (ref 98–111)
Creatinine, Ser: 0.97 mg/dL (ref 0.61–1.24)
GFR calc Af Amer: >60 mL/min (ref >60)
GFR calc non Af Amer: >60 mL/min (ref >60)
Glucose, Bld: 225 mg/dL — ABNORMAL HIGH (ref 70–99)
Potassium: 4.4 mmol/L (ref 3.5–5.1)
Sodium: 138 mmol/L (ref 135–145)
Total Bilirubin: 1.1 mg/dL (ref 0.3–1.2)
Total Protein: 6.7 g/dL (ref 6.5–8.1)

## 2018-09-24 LAB — GLUCOSE, CAPILLARY
Glucose-Capillary: 216 mg/dL — ABNORMAL HIGH (ref 70–99)
Glucose-Capillary: 250 mg/dL — ABNORMAL HIGH (ref 70–99)

## 2018-09-24 LAB — MAGNESIUM: MAGNESIUM: 1.8 mg/dL (ref 1.7–2.4)

## 2018-09-24 LAB — HEPARIN LEVEL (UNFRACTIONATED): Heparin Unfractionated: 0.46 IU/mL (ref 0.30–0.70)

## 2018-09-24 MED ORDER — METOPROLOL SUCCINATE ER 25 MG PO TB24: 25.0000 mg | ORAL_TABLET | Freq: Every day | ORAL | Status: DC

## 2018-09-24 MED ORDER — APIXABAN 5 MG PO TABS: 5.0000 mg | ORAL_TABLET | Freq: Two times a day (BID) | ORAL | 0 refills | Status: DC

## 2018-09-24 MED ORDER — DILTIAZEM LOAD VIA INFUSION: 20.0000 mg | Freq: Once | INTRAVENOUS | Status: DC

## 2018-09-24 MED ORDER — PERFLUTREN LIPID MICROSPHERE
INTRAVENOUS | Status: AC
  Administered 2018-09-24: 3 mL via INTRAVENOUS
  Filled 2018-09-24: qty 10

## 2018-09-24 MED ORDER — APIXABAN 5 MG PO TABS
5.0000 mg | ORAL_TABLET | Freq: Two times a day (BID) | ORAL | Status: DC
  Administered 2018-09-24: 5 mg via ORAL
  Filled 2018-09-24: qty 1

## 2018-09-24 MED ORDER — PIOGLITAZONE HCL 15 MG PO TABS: 30.0000 mg | ORAL_TABLET | Freq: Every day | ORAL | Status: DC

## 2018-09-24 MED ORDER — INSULIN GLARGINE 100 UNIT/ML ~~LOC~~ SOLN
50.0000 [IU] | Freq: Two times a day (BID) | SUBCUTANEOUS | Status: DC
  Filled 2018-09-24: qty 0.5

## 2018-09-24 NOTE — Progress Notes (Signed)
Removed PIV access and Pt received eliquis then stopped Heparin drip. Pt & wife received discharge instructions and understood it well. Pt took his all belongings. HS [employer]

## 2018-09-24 NOTE — Discharge Instructions (Signed)
Apixaban oral tablets °What is this medicine? °APIXABAN (a PIX a ban) is an anticoagulant (blood thinner). It is used to lower the chance of stroke in people with a medical condition called atrial fibrillation. It is also used to treat or prevent blood clots in the lungs or in the veins. °This medicine may be used for other purposes; ask your health care provider or pharmacist if you have questions. °COMMON BRAND NAME(S): Eliquis °What should I tell my health care provider before I take this medicine? °They need to know if you have any of these conditions: °-bleeding disorders °-bleeding in the brain °-blood in your stools (black or tarry stools) or if you have blood in your vomit °-history of stomach bleeding °-kidney disease °-liver disease °-mechanical heart valve °-an unusual or allergic reaction to apixaban, other medicines, foods, dyes, or preservatives °-pregnant or trying to get pregnant °-breast-feeding °How should I use this medicine? °Take this medicine by mouth with a glass of water. Follow the directions on the prescription label. You can take it with or without food. If it upsets your stomach, take it with food. Take your medicine at regular intervals. Do not take it more often than directed. Do not stop taking except on your doctor's advice. Stopping this medicine may increase your risk of a blood clot. Be sure to refill your prescription before you run out of medicine. °Talk to your pediatrician regarding the use of this medicine in children. Special care may be needed. °Overdosage: If you think you have taken too much of this medicine contact a poison control center or emergency room at once. °NOTE: This medicine is only for you. Do not share this medicine with others. °What if I miss a dose? °If you miss a dose, take it as soon as you can. If it is almost time for your next dose, take only that dose. Do not take double or extra doses. °What may interact with this medicine? °This medicine may  interact with the following: °-aspirin and aspirin-like medicines °-certain medicines for fungal infections like ketoconazole and itraconazole °-certain medicines for seizures like carbamazepine and phenytoin °-certain medicines that treat or prevent blood clots like warfarin, enoxaparin, and dalteparin °-clarithromycin °-NSAIDs, medicines for pain and inflammation, like ibuprofen or naproxen °-rifampin °-ritonavir °-St. John's wort °This list may not describe all possible interactions. Give your health care provider a list of all the medicines, herbs, non-prescription drugs, or dietary supplements you use. Also tell them if you smoke, drink alcohol, or use illegal drugs. Some items may interact with your medicine. °What should I watch for while using this medicine? °Visit your healthcare professional for regular checks on your progress. You may need blood work done while you are taking this medicine. Your condition will be monitored carefully while you are receiving this medicine. It is important not to miss any appointments. °Avoid sports and activities that might cause injury while you are using this medicine. Severe falls or injuries can cause unseen bleeding. Be careful when using sharp tools or knives. Consider using an electric razor. Take special care brushing or flossing your teeth. Report any injuries, bruising, or red spots on the skin to your healthcare professional. °If you are going to need surgery or other procedure, tell your healthcare professional that you are taking this medicine. °Wear a medical ID bracelet or chain. Carry a card that describes your disease and details of your medicine and dosage times. °What side effects may I notice from receiving this medicine? °Side   effects that you should report to your doctor or health care professional as soon as possible: -allergic reactions like skin rash, itching or hives, swelling of the face, lips, or tongue -signs and symptoms of bleeding such as  bloody or black, tarry stools; red or dark-brown urine; spitting up blood or brown material that looks like coffee grounds; red spots on the skin; unusual bruising or bleeding from the eye, gums, or nose -signs and symptoms of a blood clot such as chest pain; shortness of breath; pain, swelling, or warmth in the leg -signs and symptoms of a stroke such as changes in vision; confusion; trouble speaking or understanding; severe headaches; sudden numbness or weakness of the face, arm or leg; trouble walking; dizziness; loss of coordination This list may not describe all possible side effects. Call your doctor for medical advice about side effects. You may report side effects to FDA at 1-800-FDA-1088. Where should I keep my medicine? Keep out of the reach of children. Store at room temperature between 20 and 25 degrees C (68 and 77 degrees F). Throw away any unused medicine after the expiration date. NOTE: This sheet is a summary. It may not cover all possible information. If you have questions about this medicine, talk to your doctor, pharmacist, or health care provider.  2019 Elsevier/Gold Standard (2017-08-05 11:20:07)   Atrial Fibrillation Atrial fibrillation is a type of irregular or rapid heartbeat (arrhythmia). In atrial fibrillation, the top part of the heart (atria) quivers in a chaotic pattern. This makes the heart unable to pump blood normally. Having atrial fibrillation can increase your risk for other health problems, such as:  Blood can pool in the atria and form clots. If a clot travels to the brain, it can cause a stroke.  The heart muscle may weaken from the irregular blood flow. This can cause heart failure. Atrial fibrillation may start suddenly and stop on its own, or it may become a long-lasting problem. What are the causes? This condition is caused by some heart-related conditions or procedures, including:  High blood pressure. This is the most common cause.  Heart  failure.  Heart valve conditions.  Inflammation of the sac that surrounds the heart (pericarditis).  Heart surgery.  Coronary artery disease.  Certain heart rhythm disorders, such as Wolf-Parkinson-White syndrome. Other causes include:  Pneumonia.  Obstructive sleep apnea.  Lung cancer.  Thyroid problems, especially if the thyroid is overactive (hyperthyroidism).  Excessive alcohol or drug use. Sometimes, the cause of this condition is not known. What increases the risk? This condition is more likely to develop in:  Older people.  People who smoke.  People who have diabetes mellitus.  People who are overweight (obese).  Athletes who exercise vigorously.  People who have a family history. What are the signs or symptoms? Symptoms of this condition include:  A feeling that your heart is beating rapidly or irregularly.  A feeling of discomfort or pain in your chest.  Shortness of breath.  Sudden light-headedness or weakness.  Getting tired easily during exercise. In some cases, there are no symptoms. How is this diagnosed? Your health care provider may be able to detect atrial fibrillation when taking your pulse. If detected, this condition may be diagnosed with:  Electrocardiogram (ECG).  Ambulatory cardiac monitor. This device records your heartbeats for 24 hours or more.  Transthoracic echocardiogram (TTE) to evaluate how blood flows through your heart.  Transesophageal echocardiogram (TEE) to view more detailed images of your heart.  A stress test.  Imaging tests, such as a CT scan or chest X-ray.  Blood tests. How is this treated? This condition may be treated with:  Medicines to slow down the heart rate or bring the heart's rhythm back to normal.  Medicines to prevent blood clots from forming.  Electrical cardioversion. This delivers a low-energy shock to the heart to reset its rhythm.  Ablation. This procedure destroys the part of the  heart tissue that sends abnormal signals.  Left atrial appendage occlusion/excision. This seals off a common place in the atria where blood clots can form (left atrial appendage). The goal of treatment is to prevent blood clots from forming and to keep your heart beating at a normal rate and rhythm. Treatment depends on underlying medical conditions and how you feel when you are experiencing fibrillation. Follow these instructions at home: Medicines  Take over-the counter and prescription medicines only as told by your health care provider.  If your health care provider prescribed a blood-thinning medicine (anticoagulant), take it exactly as told. Taking too much blood-thinning medicine can cause bleeding. Taking too little can enable a blood clot to form and travel to the brain, causing a stroke. Lifestyle      Do not use any products that contain nicotine or tobacco, such as cigarettes and e-cigarettes. If you need help quitting, ask your health care provider.  Do not drink beverages that contain caffeine, such as coffee, soda, and tea.  Follow diet instructions as told by your health care provider.  Exercise regularly as told by your health care provider.  Do not drink alcohol. General instructions  If you have obstructive sleep apnea, manage your condition as told by your health care provider.  Maintain a healthy weight. Do not use diet pills unless your health care provider approves. Diet pills may make heart problems worse.  Keep all follow-up visits as told by your health care provider. This is important. Contact a health care provider if you:  Notice a change in the rate, rhythm, or strength of your heartbeat.  Are taking an anticoagulant and you notice increased bruising.  Tire more easily when you exercise or exert yourself.  Have a sudden change in weight. Get help right away if you have:   Chest pain, abdominal pain, sweating, or weakness.  Difficulty  breathing.  Blood in your vomit, stool (feces), or urine.  Any symptoms of a stroke. "BE FAST" is an easy way to remember the main warning signs of a stroke: ? B - Balance. Signs are dizziness, sudden trouble walking, or loss of balance. ? E - Eyes. Signs are trouble seeing or a sudden change in vision. ? F - Face. Signs are sudden weakness or numbness of the face, or the face or eyelid drooping on one side. ? A - Arms. Signs are weakness or numbness in an arm. This happens suddenly and usually on one side of the body. ? S - Speech. Signs are sudden trouble speaking, slurred speech, or trouble understanding what people say. ? T - Time. Time to call emergency services. Write down what time symptoms started.  Other signs of a stroke, such as: ? A sudden, severe headache with no known cause. ? Nausea or vomiting. ? Seizure. These symptoms may represent a serious problem that is an emergency. Do not wait to see if the symptoms will go away. Get medical help right away. Call your local emergency services (911 in the [place] Do not drive yourself to the hospital. Summary  Atrial  fibrillation is a type of irregular or rapid heartbeat (arrhythmia).  Symptoms include a feeling that your heart is beating fast or irregularly. In some cases, you may not have symptoms.  The condition is treated with medicines to slow down the heart rate or bring the heart's rhythm back to normal. You may also need blood-thinning medicines to prevent blood clots.  Get help right away if you have symptoms or signs of a stroke. This information is not intended to replace advice given to you by your health care provider. Make sure you discuss any questions you have with your health care provider. Document Released: 08/10/2005 Document Revised: 10/01/2017 Document Reviewed: 10/01/2017 Elsevier Interactive Patient Education  2019 [employer].

## 2018-09-24 NOTE — Progress Notes (Signed)
  Echocardiogram 2D Echocardiogram has been performed.  Johny Chess 09/24/2018, 10:10 AM

## 2018-09-24 NOTE — Progress Notes (Signed)
Pt having 2.0 sec pauses and hr dipping down to 40's. Hr in 60's.  Pt asymptomatic. Cardizem gtt turned off.  Paged Md. bp 136/77.  Will continue to monitor. Saunders Revel T

## 2018-09-24 NOTE — Progress Notes (Addendum)
Re-paged Md.  No new orders received.  Hr 70's 80's at this time. Will continue to monitor. Saunders Revel T

## 2018-09-24 NOTE — Discharge Summary (Signed)
DISCHARGE SUMMARY  Dragon Thrush  MR#: [id number]  DOB:February 09, 1951  Date of Admission: 09/22/2018 Date of Discharge: 09/24/2018  Attending Physician:Celsa Nordahl Hennie Duos, MD  Patient's WUJ:[id number], Cammie Mcgee, MD  Consults:  [facility] Card[facility]   Disposition: D/C home   Follow-up Appts: Follow-up Information    Hilty, Nadean Corwin, MD Follow up.   Specialty:  Cardiology Why:  The office will call Contact information: [place] [address] [phone]        [name], MD Follow up in 10 day(s).   Specialty:  Family Medicine Contact information: [address] [place] [address] [phone]          Tests Needing Follow-up: -assess tolerance of newly started Eliquis - check CBC for occult blood loss -determine if long term use of Eliquis indicated -follow up on results of TTE   Discharge Diagnoses: Chest pain Newly diagnosed Afib w/ RVR  Hypertension Hyperlipidemia DM2  Initial presentation: 68 y.o.malewith a hx of DM, HTN, HLD who presented w/ chest pain. 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 He took 1 of his wife's nitroglycerin tablets which relieved 90% of his chest pain. The pain resolved fully when he was started on a nitro gtt in the ED.   Hospital Course:  Chest pain Troponin x3negative - EKG without acute ischemic changes - cardiac cath 1/31 w/o CAD - d-dimer negative   Newly diagnosed Afib w/ RVR  Rate now controlled oncardizem gtt then converted to NSR on AM of d/c - heparin gtt > Eliquis - TSH normal - TTE pending at time of d/c   Hypertension No changes in tx at this time - follow trend   Hyperlipidemia Continue home meds - LDL 25  DM2 A1c 7.3 - resume usual home meds at d/c   Allergies as of 09/24/2018      Reactions   Eggs Or Egg-derived Products Nausea And Vomiting      Medication  List    STOP taking these medications   aspirin 81 MG EC tablet Commonly known as:  ASPIRIN LOW DOSE   ibuprofen 200 MG tablet Commonly known as:  ADVIL,MOTRIN     TAKE these medications   amLODipine 10 MG tablet Commonly known as:  NORVASC TAKE 1 TABLET(10 MG) BY MOUTH DAILY What changed:  See the new instructions.   apixaban 5 MG Tabs tablet Commonly known as:  ELIQUIS Take 1 tablet (5 mg total) by mouth 2 (two) times daily.   ezetimibe 10 MG tablet Commonly known as:  ZETIA TAKE 1 TABLET(10 MG) BY MOUTH DAILY What changed:    how much to take  how to take this  when to take this  additional instructions   glucose blood test strip Commonly known as:  CONTOUR NEXT TEST USE AS DIRECTED TO CHECK BLOOD SUGAR FOUR TIMES DAILY.DX E11.9   HUMALOG 100 UNIT/ML injection Generic drug:  insulin lispro INJECT 40 UNITS INTO SKIN THREE TIMES DAILY WITH MEALS PER SLIDING SCALE What changed:    how much to take  how to take this  when to take this  additional instructions   insulin glargine 100 UNIT/ML injection Commonly known as:  LANTUS ADMINISTER 50 UNITS UNDER THE SKIN TWICE DAILY What changed:    how much to take  how to take this  when to take this  additional instructions   Insulin Pen  Needle 31G X 8 MM Misc Use 1 daily   Insulin Syringe-Needle U-100 30G X 1/2" 0.5 ML Misc Commonly known as:  B-D INS SYRINGE 0.5CC/30GX1/2" USE WITH INSULIN 5 TIMES A DAY AS DIRECTED   liraglutide 18 MG/3ML Sopn Commonly known as:  VICTOZA INJECT 1.2 MG UNDER THE SKIN ONCE DAILY What changed:    how much to take  how to take this  when to take this  additional instructions   lisinopril 20 MG tablet Commonly known as:  PRINIVIL,ZESTRIL Take 2 tablets (40 mg total) by mouth daily. What changed:    how much to take  when to take this   meloxicam 7.5 MG tablet Commonly known as:  MOBIC TAKE 1 TABLET BY MOUTH DAILY AS NEEDED FOR PAIN AND  IMFLAMMATION What changed:  See the new instructions.   metFORMIN 500 MG tablet Commonly known as:  GLUCOPHAGE TAKE 2 TABLETS(1000 MG) BY MOUTH TWICE DAILY What changed:  See the new instructions.   metoprolol succinate 25 MG 24 hr tablet Commonly known as:  TOPROL-XL TAKE 1 TABLET(25 MG) BY MOUTH DAILY What changed:  See the new instructions.   omega-3 acid ethyl esters 1 g capsule Commonly known as:  LOVAZA TAKE 2 CAPSULES BY MOUTH TWICE DAILY What changed:    how much to take  how to take this  when to take this  additional instructions   pioglitazone 30 MG tablet Commonly known as:  ACTOS TAKE 1 TABLET(30 MG) BY MOUTH DAILY What changed:    how much to take  how to take this  when to take this  additional instructions   pregabalin 150 MG capsule Commonly known as:  LYRICA TAKE 1 CAPSULE BY MOUTH TWICE A DAY What changed:    how much to take  how to take this  when to take this  additional instructions   rosuvastatin 40 MG tablet Commonly known as:  CRESTOR TAKE 1/2 TABLET BY MOUTH AT BEDTIME       Day of Discharge BP 128/89 (BP Location: Left Arm)   Pulse 74   Temp (!) 97.5 F (36.4 C) (Oral)   Resp 18   Ht 6\' 1"  (6.834 m)   Wt (!) 138.2 kg   SpO2 97%   BMI 40.20 kg/m   Physical Exam: General: No acute respiratory distress Lungs: Clear to auscultation bilaterally without wheezes or crackles Cardiovascular: Regular rate and rhythm without murmur gallop or rub normal S1 and S2 Abdomen: Nontender, nondistended, soft, bowel sounds positive, no rebound, no ascites, no appreciable mass Extremities: No significant cyanosis, clubbing, or edema bilateral lower extremities  Basic Metabolic Panel: Recent Labs  Lab 09/22/18 1459 09/24/18 0703  NA 136 138  K 4.6 4.4  CL 103 106  CO2 23 22  GLUCOSE 105* 225*  BUN 16 14  CREATININE 1.05 0.97  CALCIUM 9.6 9.0  MG  --  1.8    Liver Function Tests: Recent Labs  Lab 09/24/18 0703  AST  17  ALT 22  ALKPHOS 43  BILITOT 1.1  PROT 6.7  ALBUMIN 3.3*    CBC: Recent Labs  Lab 09/22/18 1459 09/23/18 0726 09/24/18 0703  WBC 15.3* 11.2* 8.5  HGB 14.3 12.4* 13.1  HCT 45.1 36.8* 40.2  MCV 90.6 88.0 87.8  PLT 199 162 142*    Cardiac Enzymes: Recent Labs  Lab 09/22/18 1925 09/23/18 0218 09/23/18 0726  TROPONINI <0.03 <0.03 <0.03    CBG: Recent Labs  Lab 09/23/18 1151 09/23/18  1622 09/23/18 2107 09/24/18 0753 09/24/18 1147  GLUCAP 188* 189* 290* 216* 250*    Recent Results (from the past 240 hour(s))  MRSA PCR Screening     Status: None   Collection Time: 09/22/18  9:32 PM  Result Value Ref Range Status   MRSA by PCR NEGATIVE NEGATIVE Final    Comment:        The GeneXpert MRSA Assay (FDA approved for NASAL specimens only), is one component of a comprehensive MRSA colonization surveillance program. It is not intended to diagnose MRSA infection nor to guide or monitor treatment for MRSA infections. Performed at [facility], [place] [address] [place], [place] [address]      Time spent in discharge (includes decision making & examination of pt): 30 minutes  09/24/2018, 2:06 PM   Cherene Altes, MD [facility] Office  [phone] Pager [phone]  On-Call/Text Page:      [email or web address]      password [facility]

## 2018-09-24 NOTE — Progress Notes (Addendum)
Progress Note  Patient Name: Ronald Dorsey Date of Encounter: 09/24/2018  Primary Cardiologist: Pixie Casino, MD    Subjective   Currently feeling well.  No chest pain or shortness of breath.  Converted to sinus rhythm overnight.  Inpatient Medications    Scheduled Meds: . aspirin EC  162 mg Oral QHS  . ezetimibe  10 mg Oral QHS  . insulin aspart  0-5 Units Subcutaneous QHS  . insulin aspart  0-9 Units Subcutaneous TID WC  . insulin glargine  50 Units Subcutaneous BID WC  . metoprolol succinate  25 mg Oral QHS  . omega-3 acid ethyl esters  1 g Oral BID  . [START ON 09/25/2018] pioglitazone  30 mg Oral QAC breakfast  . pregabalin  150 mg Oral QHS  . rosuvastatin  20 mg Oral QHS  . sodium chloride flush  3 mL Intravenous Q12H   Continuous Infusions: . sodium chloride    . heparin 1,550 Units/hr (09/24/18 0300)   PRN Meds: sodium chloride, acetaminophen, ondansetron (ZOFRAN) IV, sodium chloride flush   Vital Signs    Vitals:   09/24/18 0500 09/24/18 0600 09/24/18 0751 09/24/18 1145  BP: 136/77 119/67 140/79 128/89  Pulse: 67 71 89 74  Resp: 18 16 12 18   Temp:   (!) 97.4 F (36.3 C) (!) 97.5 F (36.4 C)  TempSrc:   Oral Oral  SpO2: 96% 96% 97% 97%  Weight:      Height:        Intake/Output Summary (Last 24 hours) at 09/24/2018 1303 Last data filed at 09/24/2018 0600 Gross per 24 hour  Intake 591.64 ml  Output -  Net 591.64 ml   Last 3 Weights 09/23/2018 09/22/2018 09/22/2018  Weight (lbs) 304 lb 10.8 oz 309 lb 1.4 oz 300 lb  Weight (kg) 138.2 kg 140.2 kg 136.079 kg      Telemetry    Atrial fibrillation converted to sinus rhythm with rare PVCs- Personally Reviewed  ECG    Sinus rhythm- Personally Reviewed  Physical Exam   GEN: No acute distress.   Neck: No JVD Cardiac: RRR, no murmurs, rubs, or gallops.  Respiratory: Clear to auscultation bilaterally. GI: Soft, nontender, non-distended  MS: No edema; No deformity. Neuro:  Nonfocal  Psych: Normal  affect   Labs    Chemistry Recent Labs  Lab 09/22/18 1459 09/24/18 0703  NA 136 138  K 4.6 4.4  CL 103 106  CO2 23 22  GLUCOSE 105* 225*  BUN 16 14  CREATININE 1.05 0.97  CALCIUM 9.6 9.0  PROT  --  6.7  ALBUMIN  --  3.3*  AST  --  17  ALT  --  22  ALKPHOS  --  43  BILITOT  --  1.1  GFRNONAA >60 >60  GFRAA >60 >60  ANIONGAP 10 10     Hematology Recent Labs  Lab 09/22/18 1459 09/23/18 0726 09/24/18 0703  WBC 15.3* 11.2* 8.5  RBC 4.98 4.18* 4.58  HGB 14.3 12.4* 13.1  HCT 45.1 36.8* 40.2  MCV 90.6 88.0 87.8  MCH 28.7 29.7 28.6  MCHC 31.7 33.7 32.6  RDW 14.0 14.2 14.1  PLT 199 162 142*    Cardiac Enzymes Recent Labs  Lab 09/22/18 1925 09/23/18 0218 09/23/18 0726  TROPONINI <0.03 <0.03 <0.03    Recent Labs  Lab 09/22/18 1500 09/22/18 1815  TROPIPOC 0.01 0.00     BNPNo results for input(s): BNP, PROBNP in the last 168 hours.   DDimer  Recent Labs  Lab 09/23/18 1204  DDIMER 0.32     Radiology    Dg Chest 2 View  Result Date: 09/22/2018 CLINICAL DATA:  Left chest pain EXAM: CHEST - 2 VIEW COMPARISON:  July 22 2012 FINDINGS: The heart size and mediastinal contours are within normal limits. Both lungs are clear. The visualized skeletal structures are stable. IMPRESSION: No active cardiopulmonary disease. Electronically Signed   By: Abelardo Diesel M.D.   On: 09/22/2018 15:06    Cardiac Studies   LHC  The left ventricular systolic function is normal.  LV end diastolic pressure is normal.  The left ventricular ejection fraction is 55-65% by visual estimate.  There is no mitral valve regurgitation.   1. Coronary arteries are widely patent.  2. Normal LV systolic function 3. Normal filling pressures.  4. Onset of atrial fibrillation during his catheterization procedure  Patient Profile     68 y.o. male history of hypertension, hyperlipidemia, diabetes who presented to the hospital with chest pain.  Was found to have normal coronary  arteries.  Went into atrial fibrillation during left heart catheterization.  Assessment & Plan    1.  Chest pain: Troponins are negative.  At this point it is unclear as to the cause of his chest pain.  Fortunately his cardiac cath shows no major coronary abnormalities.  2.  Hypertension: Currently on home medications.  Blood pressure has been better controlled.  3.  Hyperlipidemia: LDL 75.  Continue Zetia and Crestor  4.  Atrial fibrillation: Occurred during left heart catheterization.  Put him on Eliquis for now.  He Waunetta Riggle have a discussion with Dr. Debara Pickett at follow-up in 1 month.  Can discuss at that point whether or not Eliquis is appropriate.     For questions or updates, please contact [place] Please consult [email or web address] for contact info under    [place] sign off.   Medication Recommendations:  eliquis Other recommendations (labs, testing, etc):  none Follow up as an outpatient:  1 month with Hilty     Signed, Zayana Salvador Meredith Leeds, MD  09/24/2018, 1:03 PM

## 2018-09-26 ENCOUNTER — Encounter (HOSPITAL_COMMUNITY): Payer: Self-pay | Admitting: Cardiovascular Disease

## 2018-10-12 ENCOUNTER — Other Ambulatory Visit: Payer: Self-pay | Admitting: Podiatry

## 2018-10-12 ENCOUNTER — Ambulatory Visit (INDEPENDENT_AMBULATORY_CARE_PROVIDER_SITE_OTHER): Payer: Medicare Other

## 2018-10-12 ENCOUNTER — Ambulatory Visit (INDEPENDENT_AMBULATORY_CARE_PROVIDER_SITE_OTHER): Payer: Medicare Other | Admitting: Podiatry

## 2018-10-12 ENCOUNTER — Encounter: Payer: Self-pay | Admitting: Podiatry

## 2018-10-12 VITALS — BP 162/65

## 2018-10-12 DIAGNOSIS — M7661 Achilles tendinitis, right leg: Secondary | ICD-10-CM | POA: Diagnosis not present

## 2018-10-12 DIAGNOSIS — M79671 Pain in right foot: Secondary | ICD-10-CM

## 2018-10-12 DIAGNOSIS — M79672 Pain in left foot: Principal | ICD-10-CM

## 2018-10-12 DIAGNOSIS — I209 Angina pectoris, unspecified: Secondary | ICD-10-CM | POA: Diagnosis not present

## 2018-10-12 DIAGNOSIS — M7662 Achilles tendinitis, left leg: Secondary | ICD-10-CM | POA: Diagnosis not present

## 2018-10-12 MED ORDER — TRIAMCINOLONE ACETONIDE 10 MG/ML IJ SUSP
10.0000 mg | Freq: Once | INTRAMUSCULAR | Status: AC
  Administered 2018-10-12: 10 mg

## 2018-10-12 NOTE — Patient Instructions (Signed)

## 2018-10-12 NOTE — Progress Notes (Signed)
Subjective:   Patient ID: Ronald Dorsey, male   DOB: 68 y.o.   MRN: [id number]   HPI Patient presents stating he is having a lot of pain in the back of the heel right over left and he does not remember specific injury but he had is on his feet quite a bit and likes to be active it is having trouble wearing shoe gear comfortably currently patient does not smoke and likes to be active   Review of Systems  All other systems reviewed and are negative.       Objective:  Physical Exam Vitals signs and nursing note reviewed.  Constitutional:      Appearance: He is well-developed.  Pulmonary:     Effort: Pulmonary effort is normal.  Musculoskeletal: Normal range of motion.  Skin:    General: Skin is warm.  Neurological:     Mental Status: He is alert.     Neurovascular status intact muscle strength is adequate range of motion within normal limits with patient found to have exquisite discomfort heel region posterior medial side right and mild on the left.  There is good Achilles tendon motion with no indications of muscle strength loss or pathology from that standpoint and patient was noted to have good digital perfusion and is well oriented x3     Assessment:  Acute Achilles tendinitis right medial side with inflammation fluid buildup     Plan:  H&P x-rays reviewed condition discussed and I discussed injection with risk associated with the injection.  Patient wants to accept risk of injection today I did a sterile prep medial side right posterior heel and injected 3 mg dexamethasone Kenalog 5 mg Xylocaine and advised on stretching exercises ice therapy and reduced activity.  Reappoint to recheck again in the next several weeks or earlier if any issues should occur  X-rays indicate large posterior spur formation bilateral

## 2018-10-14 ENCOUNTER — Other Ambulatory Visit: Payer: Self-pay | Admitting: *Deleted

## 2018-10-14 MED ORDER — APIXABAN 5 MG PO TABS: 5.0000 mg | ORAL_TABLET | Freq: Two times a day (BID) | ORAL | 0 refills | Status: DC

## 2018-10-14 NOTE — Telephone Encounter (Signed)
Received fax requesting refill on Eliquis.   Patient has not had Hospital F/U since heart cath and no cardiologist noted on chart.   Ok to refill?

## 2018-10-19 ENCOUNTER — Ambulatory Visit (INDEPENDENT_AMBULATORY_CARE_PROVIDER_SITE_OTHER): Payer: Medicare Other

## 2018-10-19 DIAGNOSIS — I480 Paroxysmal atrial fibrillation: Secondary | ICD-10-CM | POA: Diagnosis not present

## 2018-10-26 ENCOUNTER — Ambulatory Visit: Payer: Medicare Other | Admitting: Podiatry

## 2018-11-02 ENCOUNTER — Encounter: Payer: Self-pay | Admitting: Family Medicine

## 2018-11-14 ENCOUNTER — Other Ambulatory Visit: Payer: Self-pay | Admitting: Family Medicine

## 2018-12-22 ENCOUNTER — Other Ambulatory Visit: Payer: Self-pay

## 2018-12-22 ENCOUNTER — Encounter: Payer: Self-pay | Admitting: Family Medicine

## 2018-12-22 ENCOUNTER — Ambulatory Visit (INDEPENDENT_AMBULATORY_CARE_PROVIDER_SITE_OTHER): Payer: Medicare Other | Admitting: Family Medicine

## 2018-12-22 VITALS — BP 146/68 | HR 66 | Temp 98.0°F | Resp 18 | Ht 73.0 in | Wt 308.0 lb

## 2018-12-22 DIAGNOSIS — I209 Angina pectoris, unspecified: Secondary | ICD-10-CM | POA: Diagnosis not present

## 2018-12-22 DIAGNOSIS — M5412 Radiculopathy, cervical region: Secondary | ICD-10-CM

## 2018-12-22 MED ORDER — PREDNISONE 20 MG PO TABS: ORAL_TABLET | ORAL | 0 refills | Status: DC

## 2018-12-22 MED ORDER — OXYCODONE-ACETAMINOPHEN 7.5-325 MG PO TABS: 1.0000 | ORAL_TABLET | ORAL | 0 refills | Status: DC | PRN

## 2018-12-22 NOTE — Progress Notes (Signed)
Subjective:    Patient ID: Ronald Dorsey, male    DOB: Jan 06, 1951, 68 y.o.   MRN: [id number]  Patient has a one-week history of pain in the right side of his neck radiating into his right trapezius into his right shoulder down his right arm into his right forearm and into his right hand.  The pain is worse whenever he extends his neck to look high.  He has a positive Spurling sign on the right.  He has normal reflexes and normal grip strength and normal shoulder abduction on the right side however he had does have weakness with finger abduction on the right side.  He reports burning pins-and-needles pain radiating down his arm particularly the lateral aspect of his arm.  He denies any falls or injuries or car accidents.  It occurred after he lifted something extremely heavy last week Past Medical History:  Diagnosis Date  . Apnea   . Arthritis   . Diabetes mellitus without complication ([place])   . Hypercholesterolemia   . Hypertension   . Neuromuscular disorder (HCC)    sciatica  . PSVT (paroxysmal supraventricular tachycardia) ([place])    Past Surgical History:  Procedure Laterality Date  . CARPAL TUNNEL RELEASE  2010   left wrist  . EYE SURGERY    . LEFT HEART CATH AND CORONARY ANGIOGRAPHY N/A 09/23/2018   Procedure: LEFT HEART CATH AND CORONARY ANGIOGRAPHY;  Surgeon: Burnell Blanks, MD;  Location: [facility];  Service: Cardiovascular;  Laterality: N/A;  . TONSILLECTOMY  1961   Current Outpatient Medications on File Prior to Visit  Medication Sig Dispense Refill  . amLODipine (NORVASC) 10 MG tablet TAKE 1 TABLET(10 MG) BY MOUTH DAILY (Patient taking differently: Take 10 mg by mouth daily. ) 90 tablet 0  . ELIQUIS 5 MG TABS tablet TAKE 1 TABLET BY MOUTH TWICE A DAY 60 tablet 2  . ezetimibe (ZETIA) 10 MG tablet TAKE 1 TABLET(10 MG) BY MOUTH DAILY (Patient taking differently: Take 10 mg by mouth at bedtime. ) 90 tablet 3  . glucose blood (CONTOUR NEXT TEST) test strip USE  AS DIRECTED TO CHECK BLOOD SUGAR FOUR TIMES DAILY.DX E11.9 400 each 3  . HUMALOG 100 UNIT/ML injection INJECT 40 UNITS INTO SKIN THREE TIMES DAILY WITH MEALS PER SLIDING SCALE (Patient taking differently: Inject 15-40 Units into the skin See admin instructions. INJECT 15-40 UNITS INTO SKIN THREE TIMES DAILY WITH MEALS, PER SLIDING SCALE) 110 mL 2  . insulin glargine (LANTUS) 100 UNIT/ML injection ADMINISTER 50 UNITS UNDER THE SKIN TWICE DAILY (Patient taking differently: Inject 50 Units into the skin 2 (two) times daily with a meal. ) 90 mL 3  . Insulin Pen Needle 31G X 8 MM MISC Use 1 daily 100 each 5  . Insulin Syringe-Needle U-100 (B-D INS SYRINGE 0.5CC/30GX1/2") 30G X 1/2" 0.5 ML MISC USE WITH INSULIN 5 TIMES A DAY AS DIRECTED 400 each 3  . liraglutide (VICTOZA) 18 MG/3ML SOPN INJECT 1.2 MG UNDER THE SKIN ONCE DAILY (Patient taking differently: Inject 1.2 mg into the skin daily before breakfast. ) 6 pen 2  . lisinopril (PRINIVIL,ZESTRIL) 20 MG tablet Take 2 tablets (40 mg total) by mouth daily. (Patient taking differently: Take 20 mg by mouth 2 (two) times daily. ) 180 tablet 3  . meloxicam (MOBIC) 7.5 MG tablet TAKE 1 TABLET BY MOUTH DAILY AS NEEDED FOR PAIN AND IMFLAMMATION (Patient taking differently: Take 7.5 mg by mouth daily as needed (for pain or inflammation). )  90 tablet 0  . metFORMIN (GLUCOPHAGE) 500 MG tablet TAKE 2 TABLETS(1000 MG) BY MOUTH TWICE DAILY (Patient taking differently: Take 1,000 mg by mouth 2 (two) times daily with a meal. ) 360 tablet 0  . metoprolol succinate (TOPROL-XL) 25 MG 24 hr tablet TAKE 1 TABLET(25 MG) BY MOUTH DAILY (Patient taking differently: Take 25 mg by mouth at bedtime. ) 90 tablet 2  . omega-3 acid ethyl esters (LOVAZA) 1 g capsule TAKE 2 CAPSULES BY MOUTH TWICE DAILY (Patient taking differently: Take 1 g by mouth 2 (two) times daily. ) 360 capsule 3  . pioglitazone (ACTOS) 30 MG tablet TAKE 1 TABLET(30 MG) BY MOUTH DAILY (Patient taking differently: Take 30  mg by mouth daily before breakfast. ) 90 tablet 2  . pregabalin (LYRICA) 150 MG capsule TAKE 1 CAPSULE BY MOUTH TWICE A DAY (Patient taking differently: Take 150 mg by mouth at bedtime. ) 180 capsule 1  . rosuvastatin (CRESTOR) 40 MG tablet TAKE 1/2 TABLET BY MOUTH AT BEDTIME (Patient taking differently: Take 20 mg by mouth at bedtime. ) 90 tablet 0   No current facility-administered medications on file prior to visit.    Allergies  Allergen Reactions  . Eggs Or Egg-Derived Products Nausea And Vomiting   Social History   Socioeconomic History  . Marital status: Married    Spouse name: Not on file  . Number of children: Not on file  . Years of education: Not on file  . Highest education level: Not on file  Occupational History  . Not on file  Social Needs  . Financial resource strain: Not on file  . Food insecurity:    Worry: Not on file    Inability: Not on file  . Transportation needs:    Medical: Not on file    Non-medical: Not on file  Tobacco Use  . Smoking status: Former Research scientist (life sciences)  . Smokeless tobacco: Former Network engineer and Sexual Activity  . Alcohol use: Yes    Comment: 1 glass of wine a month or less  . Drug use: No  . 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 Physical activity:    Days per week: Not on file    Minutes per session: Not on file  . Stress: Not on file  Relationships  . Social connections:    Talks on phone: Not on file    Gets together: Not on file    Attends religious service: Not on file    Active member of club or organization: Not on file    Attends meetings of clubs or organizations: Not on file    Relationship status: Not on file  . Intimate partner violence:    Fear of current or ex partner: Not on file    Emotionally abused: Not on file    Physically abused: Not on file    Forced sexual activity: Not on file  Other Topics Concern  . Not on file  Social History Narrative  . Not on file    Family History  Problem Relation Age of Onset  . Hypertension Father       Review of Systems  All other systems reviewed and are negative.      Objective:   Physical Exam  Constitutional: He is oriented to person, place, and time. He appears well-developed and well-nourished. No distress.  HENT:  Head: Normocephalic and atraumatic.  Right Ear: External ear normal.  Left Ear:  External ear normal.  Nose: Nose normal.  Mouth/Throat: Oropharynx is clear and moist. No oropharyngeal exudate.  Eyes: Pupils are equal, round, and reactive to light. Conjunctivae and EOM are normal. Right eye exhibits no discharge. Left eye exhibits no discharge. No scleral icterus.  Neck: Normal range of motion. Neck supple. No JVD present. No tracheal deviation present. No thyromegaly present.  Cardiovascular: Normal rate, regular rhythm and normal heart sounds. Exam reveals no gallop and no friction rub.  No murmur heard. Pulmonary/Chest: Effort normal and breath sounds normal. No stridor. No respiratory distress. He has no wheezes. He has no rales. He exhibits no tenderness.  Abdominal: Soft. Bowel sounds are normal. He exhibits no distension and no mass. There is no abdominal tenderness. There is no rebound and no guarding.  Musculoskeletal: Normal range of motion.        General: No tenderness, deformity or edema.  Lymphadenopathy:    He has no cervical adenopathy.  Neurological: He is alert and oriented to person, place, and time. He has normal reflexes. No cranial nerve deficit. He exhibits normal muscle tone. Coordination normal.  Skin: Skin is warm. No rash noted. He is not diaphoretic. No erythema. No pallor.  Psychiatric: He has a normal mood and affect. His behavior is normal. Judgment and thought content normal.  Vitals reviewed.  Normal brachioradialis and biceps reflex.  Triceps reflexes normal on the right side.  Finger has normal wrist flexion and extension.  Empty can sign is normal in  the right arm.  He has a positive right-sided Spurling sign and also diminished finger abduction in the right hand.  Grip strength is normal       Assessment & Plan:  Right-sided cervical radiculopathy.  Begin prednisone taper pack as I suspect the patient has herniated disc causing impingement at C7-T1 based on his diminish finger abduction.  Supplement with Percocet 7.5/325 1 p.o. every 6 hours as needed pain.  Reassess in 1 week.  If pain is worsening, proceed with an MRI of the cervical spine.

## 2018-12-28 ENCOUNTER — Encounter: Payer: Self-pay | Admitting: Family Medicine

## 2019-03-17 ENCOUNTER — Other Ambulatory Visit: Payer: Self-pay | Admitting: Family Medicine

## 2019-03-17 MED ORDER — EZETIMIBE 10 MG PO TABS: ORAL_TABLET | ORAL | 3 refills | Status: DC

## 2019-03-21 ENCOUNTER — Other Ambulatory Visit: Payer: Self-pay | Admitting: *Deleted

## 2019-03-21 MED ORDER — APIXABAN 5 MG PO TABS: 5.0000 mg | ORAL_TABLET | Freq: Two times a day (BID) | ORAL | 2 refills | Status: DC

## 2019-04-14 ENCOUNTER — Other Ambulatory Visit: Payer: Self-pay | Admitting: Family Medicine

## 2019-04-14 DIAGNOSIS — Z794 Long term (current) use of insulin: Secondary | ICD-10-CM

## 2019-04-14 DIAGNOSIS — E119 Type 2 diabetes mellitus without complications: Secondary | ICD-10-CM

## 2019-04-14 DIAGNOSIS — G709 Myoneural disorder, unspecified: Secondary | ICD-10-CM

## 2019-04-17 NOTE — Telephone Encounter (Signed)
Ok to refill??  Last office visit 12/22/2018.  Last refill 09/19/2018, #1 refills.

## 2019-05-11 DIAGNOSIS — Z23 Encounter for immunization: Secondary | ICD-10-CM | POA: Diagnosis not present

## 2019-06-08 ENCOUNTER — Other Ambulatory Visit: Payer: Self-pay | Admitting: Family Medicine

## 2019-06-08 DIAGNOSIS — I10 Essential (primary) hypertension: Secondary | ICD-10-CM

## 2019-06-22 ENCOUNTER — Other Ambulatory Visit: Payer: Self-pay | Admitting: Family Medicine

## 2019-06-25 ENCOUNTER — Other Ambulatory Visit: Payer: Self-pay | Admitting: Family Medicine

## 2019-06-25 DIAGNOSIS — E785 Hyperlipidemia, unspecified: Secondary | ICD-10-CM

## 2019-06-26 ENCOUNTER — Other Ambulatory Visit: Payer: Self-pay | Admitting: Family Medicine

## 2019-07-01 ENCOUNTER — Other Ambulatory Visit: Payer: Self-pay | Admitting: Family Medicine

## 2019-07-01 DIAGNOSIS — I10 Essential (primary) hypertension: Secondary | ICD-10-CM

## 2019-07-05 ENCOUNTER — Other Ambulatory Visit: Payer: Self-pay | Admitting: Family Medicine

## 2019-07-24 ENCOUNTER — Other Ambulatory Visit: Payer: Self-pay | Admitting: Family Medicine

## 2019-07-26 ENCOUNTER — Other Ambulatory Visit: Payer: Self-pay | Admitting: Family Medicine

## 2019-08-01 DIAGNOSIS — H02403 Unspecified ptosis of bilateral eyelids: Secondary | ICD-10-CM | POA: Diagnosis not present

## 2019-08-01 DIAGNOSIS — H35033 Hypertensive retinopathy, bilateral: Secondary | ICD-10-CM | POA: Diagnosis not present

## 2019-08-01 DIAGNOSIS — E119 Type 2 diabetes mellitus without complications: Secondary | ICD-10-CM | POA: Diagnosis not present

## 2019-08-08 ENCOUNTER — Ambulatory Visit: Payer: Medicare Other | Admitting: Family Medicine

## 2019-08-10 ENCOUNTER — Other Ambulatory Visit: Payer: Self-pay

## 2019-08-10 ENCOUNTER — Ambulatory Visit (INDEPENDENT_AMBULATORY_CARE_PROVIDER_SITE_OTHER): Payer: Medicare Other | Admitting: Family Medicine

## 2019-08-10 ENCOUNTER — Encounter: Payer: Self-pay | Admitting: Family Medicine

## 2019-08-10 VITALS — BP 164/68 | HR 56 | Temp 97.3°F | Resp 16 | Ht 73.0 in | Wt 314.0 lb

## 2019-08-10 DIAGNOSIS — E119 Type 2 diabetes mellitus without complications: Secondary | ICD-10-CM | POA: Diagnosis not present

## 2019-08-10 DIAGNOSIS — Z125 Encounter for screening for malignant neoplasm of prostate: Secondary | ICD-10-CM

## 2019-08-10 DIAGNOSIS — Z794 Long term (current) use of insulin: Secondary | ICD-10-CM

## 2019-08-10 DIAGNOSIS — I209 Angina pectoris, unspecified: Secondary | ICD-10-CM

## 2019-08-10 DIAGNOSIS — E785 Hyperlipidemia, unspecified: Secondary | ICD-10-CM | POA: Diagnosis not present

## 2019-08-10 DIAGNOSIS — I10 Essential (primary) hypertension: Secondary | ICD-10-CM

## 2019-08-10 MED ORDER — HYDROCHLOROTHIAZIDE 25 MG PO TABS: 25.0000 mg | ORAL_TABLET | Freq: Every day | ORAL | 3 refills | Status: DC

## 2019-08-10 NOTE — Progress Notes (Signed)
Subjective:    Patient ID: Ronald Dorsey, male    DOB: [birth year], 68 y.o.   MRN: [id number]  Patient is here today for follow-up of his diabetes.  He is currently on insulin, Lantus 50 units twice daily and then Humalog 3 times a day with meals.  His Humalog dose varies depending upon what he is eating and what his blood sugar is averaging.  He also takes Victoza and Actos.  He admits recently that his blood sugars have been more increased.  He is often seeing blood sugars in the 200s.  He attributes this to forgetting to take his Humalog with meals on occasion.  He has been unsuccessful in losing weight.  His weight is now up to 314 pounds.  He is having more trouble getting up out of a chair and getting off the toilet and getting out of bed.  He is having increasing shortness of breath with activity due to his weight.  He denies any chest pain or angina.  His blood pressure today is elevated at 164/68 and he states that his blood pressure has been similarly elevated at home.  Both he and his wife have decided now to proceed with gastric bypass surgery as he has been unable to lose weight.  We have recommended different weight loss tactics over the last several years and he has been unsuccessful.  Victoza has afforded no weight loss.  His age and deconditioning are making it difficult for him to exercise.  He realizes if he does not do something about the weight now he would likely be confined to a wheelchair or some type of mobility aid at the time he 70.  He denies any myalgias or right upper quadrant pain on Zetia.  He is due for prostate cancer screening.  He takes Lyrica once a day for burning neuropathy in his feet however he denies any numbness or tingling.  His flu shot is up-to-date. Past Medical History:  Diagnosis Date  . Apnea   . Arthritis   . Diabetes mellitus without complication ([place])   . Hypercholesterolemia   . Hypertension   . Neuromuscular disorder (HCC)    sciatica  . PSVT  (paroxysmal supraventricular tachycardia) ([place])    Past Surgical History:  Procedure Laterality Date  . CARPAL TUNNEL RELEASE  2010   left wrist  . EYE SURGERY    . LEFT HEART CATH AND CORONARY ANGIOGRAPHY N/A 09/23/2018   Procedure: LEFT HEART CATH AND CORONARY ANGIOGRAPHY;  Surgeon: Burnell Blanks, MD;  Location: [facility];  Service: Cardiovascular;  Laterality: N/A;  . TONSILLECTOMY  1961   Current Outpatient Medications on File Prior to Visit  Medication Sig Dispense Refill  . amLODipine (NORVASC) 10 MG tablet TAKE 1 TABLET BY MOUTH EVERY DAY 90 tablet 1  . BD INSULIN SYRINGE U/F 30G X 1/2" 0.5 ML MISC USE 5 TIMES A DAY AS DIRECTED. 400 each 3  . CONTOUR NEXT TEST test strip USE AS DIRECTED TO CHECK BLOOD SUGAR FOUR TIMES DAILY.DX E11.9 300 strip 5  . ELIQUIS 5 MG TABS tablet TAKE 1 TABLET BY MOUTH TWICE A DAY 60 tablet 2  . ezetimibe (ZETIA) 10 MG tablet TAKE 1 TABLET(10 MG) BY MOUTH DAILY 90 tablet 3  . HUMALOG 100 UNIT/ML injection INJECT 40 UNITS INTO SKIN THREE TIMES DAILY WITH MEALS PER SLIDING SCALE 110 mL 2  . insulin glargine (LANTUS) 100 UNIT/ML injection Inject 0.5 mLs (50 Units total) into the skin 2 (  two) times daily with a meal. 90 mL 3  . Insulin Pen Needle 31G X 8 MM MISC Use 1 daily 100 each 5  . liraglutide (VICTOZA) 18 MG/3ML SOPN INJECT 1.2 MG UNDER THE SKIN ONCE DAILY (Patient taking differently: Inject 1.2 mg into the skin daily before breakfast. ) 6 pen 2  . lisinopril (ZESTRIL) 20 MG tablet TAKE 2 TABLETS BY MOUTH EVERY DAY 180 tablet 0  . meloxicam (MOBIC) 7.5 MG tablet TAKE 1 TABLET BY MOUTH DAILY AS NEEDED FOR PAIN AND IMFLAMMATION (Patient taking differently: Take 7.5 mg by mouth daily as needed (for pain or inflammation). ) 90 tablet 0  . metFORMIN (GLUCOPHAGE) 500 MG tablet TAKE 2 TABLETS BY MOUTH TWICE A DAY 120 tablet 0  . metoprolol succinate (TOPROL-XL) 25 MG 24 hr tablet TAKE 1 TABLET BY MOUTH DAILY 90 tablet 2  . omega-3 acid ethyl  esters (LOVAZA) 1 g capsule TAKE 2 CAPSULES BY MOUTH TWICE A DAY 360 capsule 0  . pioglitazone (ACTOS) 30 MG tablet Take 1 tablet (30 mg total) by mouth daily before breakfast. 90 tablet 3  . pregabalin (LYRICA) 150 MG capsule TAKE 1 CAPSULE BY MOUTH TWICE A DAY 180 capsule 1  . rosuvastatin (CRESTOR) 40 MG tablet TAKE 1/2 TABLET BY MOUTH AT BEDTIME 45 tablet 0   No current facility-administered medications on file prior to visit.   Allergies  Allergen Reactions  . Eggs Or Egg-Derived Products Nausea And Vomiting   Social History   Socioeconomic History  . Marital status: Married    Spouse name: Not on file  . Number of children: Not on file  . Years of education: Not on file  . Highest education level: Not on file  Occupational History  . Not on file  Tobacco Use  . Smoking status: Former Research scientist (life sciences)  . Smokeless tobacco: Former Network engineer and Sexual Activity  . Alcohol use: Yes    Comment: 1 glass of wine a month or less  . Drug use: No  . 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 Not on file  Social History Narrative  . Not on file   Social Determinants of Health   Financial Resource Strain:   . Difficulty of Paying Living Expenses: Not on file  Food Insecurity:   . Worried About Charity fundraiser in the Last Year: Not on file  . Ran Out of Food in the Last Year: Not on file  Transportation Needs:   . Lack of Transportation (Medical): Not on file  . Lack of Transportation (Non-Medical): Not on file  Physical Activity:   . Days of Exercise per Week: Not on file  . Minutes of Exercise per Session: Not on file  Stress:   . Feeling of Stress : Not on file  Social Connections:   . Frequency of Communication with Friends and Family: Not on file  . Frequency of Social Gatherings with Friends and Family: Not on file  . Attends Religious Services: Not on file  . Active Member of Clubs or Organizations: Not on file  .  Attends Archivist Meetings: Not on file  . Marital Status: Not on file  Intimate Partner Violence:   . Fear of Current or Ex-Partner: Not on file  . Emotionally Abused: Not on file  . Physically Abused: Not on file  . Sexually Abused: Not on file   Family History  Problem Relation  Age of Onset  . Hypertension Father       Review of Systems  All other systems reviewed and are negative.      Objective:   Physical Exam  Constitutional: He is oriented to person, place, and time. He appears well-developed and well-nourished. No distress.  HENT:  Head: Normocephalic and atraumatic.  Right Ear: External ear normal.  Left Ear: External ear normal.  Nose: Nose normal.  Mouth/Throat: Oropharynx is clear and moist. No oropharyngeal exudate.  Eyes: Pupils are equal, round, and reactive to light. Conjunctivae and EOM are normal. Right eye exhibits no discharge. Left eye exhibits no discharge. No scleral icterus.  Neck: No JVD present. No tracheal deviation present. No thyromegaly present.  Cardiovascular: Normal rate, regular rhythm and normal heart sounds. Exam reveals no gallop and no friction rub.  No murmur heard. Pulmonary/Chest: Effort normal and breath sounds normal. No stridor. No respiratory distress. He has no wheezes. He has no rales. He exhibits no tenderness.  Abdominal: Soft. Bowel sounds are normal. He exhibits no distension and no mass. There is no abdominal tenderness. There is no rebound and no guarding.  Musculoskeletal:        General: No tenderness, deformity or edema. Normal range of motion.     Cervical back: Normal range of motion and neck supple.  Lymphadenopathy:    He has no cervical adenopathy.  Neurological: He is alert and oriented to person, place, and time. He has normal reflexes. No cranial nerve deficit. He exhibits normal muscle tone. Coordination normal.  Skin: Skin is warm. No rash noted. He is not diaphoretic. No erythema. No pallor.    Psychiatric: He has a normal mood and affect. His behavior is normal. Judgment and thought content normal.  Vitals reviewed.         Assessment & Plan:  Diabetes mellitus, type II, insulin dependent ([place]) - Plan: Hemoglobin A1c, CBC with Differential, COMPLETE METABOLIC PANEL WITH GFR, Lipid Panel  Essential hypertension - Plan: Hemoglobin A1c, CBC with Differential, COMPLETE METABOLIC PANEL WITH GFR, Lipid Panel  Prostate cancer screening - Plan: PSA  Hyperlipidemia, unspecified hyperlipidemia type - Plan: Lipid Panel  Blood pressure today is significantly elevated and is similar to blood pressures he is saying at home.  Add hydrochlorothiazide 25 mg a day and recheck blood pressure in 2 weeks.  Check hemoglobin A1c.  Goal hemoglobin A1c is less than 7.  If significantly above 7 I would recommend adding Jardiance 25 mg a day to his regimen.  I believe weight loss is essential.  I believe gastric bypass surgery will improve his quality of life and likely increase his life expectancy and definitely decrease his morbidity.  I will screen for prostate cancer with a PSA.  I will check a fasting lipid panel and his goal LDL cholesterol is less than 100.  Diabetic foot exam was performed today and is normal.  However he is taking Lyrica for neuropathic pain once a day.

## 2019-08-11 LAB — CBC WITH DIFFERENTIAL/PLATELET
Absolute Monocytes: 861 cells/uL (ref 200–950)
Basophils Absolute: 82 cells/uL (ref 0–200)
Basophils Relative: 1 %
Eosinophils Absolute: 459 cells/uL (ref 15–500)
Eosinophils Relative: 5.6 %
HCT: 42.8 % (ref 38.5–50.0)
Hemoglobin: 14.3 g/dL (ref 13.2–17.1)
Lymphs Abs: 1894 cells/uL (ref 850–3900)
MCH: 30.4 pg (ref 27.0–33.0)
MCHC: 33.4 g/dL (ref 32.0–36.0)
MCV: 91.1 fL (ref 80.0–100.0)
MPV: 12.4 fL (ref 7.5–12.5)
Monocytes Relative: 10.5 %
Neutro Abs: 4904 cells/uL (ref 1500–7800)
Neutrophils Relative %: 59.8 %
Platelets: 169 10*3/uL (ref 140–400)
RBC: 4.7 10*6/uL (ref 4.20–5.80)
RDW: 12.5 % (ref 11.0–15.0)
Total Lymphocyte: 23.1 %
WBC: 8.2 10*3/uL (ref 3.8–10.8)

## 2019-08-11 LAB — COMPLETE METABOLIC PANEL WITH GFR
AG Ratio: 2 (calc) (ref 1.0–2.5)
ALT: 31 U/L (ref 9–46)
AST: 37 U/L — ABNORMAL HIGH (ref 10–35)
Albumin: 4.5 g/dL (ref 3.6–5.1)
Alkaline phosphatase (APISO): 54 U/L (ref 35–144)
BUN: 23 mg/dL (ref 7–25)
CO2: 23 mmol/L (ref 20–32)
Calcium: 9.8 mg/dL (ref 8.6–10.3)
Chloride: 105 mmol/L (ref 98–110)
Creat: 0.9 mg/dL (ref 0.70–1.25)
GFR, Est African American: 101 mL/min/{1.73_m2} (ref >=60)
GFR, Est Non African American: 87 mL/min/{1.73_m2} (ref >=60)
Globulin: 2.3 g/dL (calc) (ref 1.9–3.7)
Glucose, Bld: 134 mg/dL — ABNORMAL HIGH (ref 65–99)
Potassium: 4.7 mmol/L (ref 3.5–5.3)
Sodium: 141 mmol/L (ref 135–146)
Total Bilirubin: 0.6 mg/dL (ref 0.2–1.2)
Total Protein: 6.8 g/dL (ref 6.1–8.1)

## 2019-08-11 LAB — LIPID PANEL
Cholesterol: 103 mg/dL (ref <200)
HDL: 38 mg/dL — ABNORMAL LOW (ref >=40)
LDL Cholesterol (Calc): 43 mg/dL (calc)
Non-HDL Cholesterol (Calc): 65 mg/dL (calc) (ref <130)
Total CHOL/HDL Ratio: 2.7 (calc) (ref <5.0)
Triglycerides: 135 mg/dL (ref <150)

## 2019-08-11 LAB — PSA: PSA: 1.8 ng/mL (ref <=4.0)

## 2019-08-11 LAB — HEMOGLOBIN A1C
Hgb A1c MFr Bld: 7.7 % of total Hgb — ABNORMAL HIGH (ref <5.7)
Mean Plasma Glucose: 174 (calc)
eAG (mmol/L): 9.7 (calc)

## 2019-08-14 ENCOUNTER — Encounter: Payer: Self-pay | Admitting: Family Medicine

## 2019-08-14 MED ORDER — JARDIANCE 25 MG PO TABS: 25.0000 mg | ORAL_TABLET | Freq: Every day | ORAL | 1 refills | Status: DC

## 2019-08-18 ENCOUNTER — Other Ambulatory Visit: Payer: Self-pay | Admitting: Family Medicine

## 2019-09-04 DIAGNOSIS — Z6841 Body Mass Index (BMI) 40.0 and over, adult: Secondary | ICD-10-CM | POA: Diagnosis not present

## 2019-09-11 ENCOUNTER — Other Ambulatory Visit: Payer: Self-pay | Admitting: Family Medicine

## 2019-09-11 DIAGNOSIS — E785 Hyperlipidemia, unspecified: Secondary | ICD-10-CM

## 2019-09-14 ENCOUNTER — Other Ambulatory Visit: Payer: Self-pay | Admitting: Family Medicine

## 2019-09-16 ENCOUNTER — Other Ambulatory Visit: Payer: Self-pay | Admitting: Family Medicine

## 2019-09-28 ENCOUNTER — Other Ambulatory Visit: Payer: Self-pay | Admitting: Family Medicine

## 2019-10-07 ENCOUNTER — Other Ambulatory Visit: Payer: Self-pay | Admitting: Family Medicine

## 2019-10-11 DIAGNOSIS — E785 Hyperlipidemia, unspecified: Secondary | ICD-10-CM | POA: Diagnosis not present

## 2019-10-11 DIAGNOSIS — Z01818 Encounter for other preprocedural examination: Secondary | ICD-10-CM | POA: Diagnosis not present

## 2019-10-11 DIAGNOSIS — G4733 Obstructive sleep apnea (adult) (pediatric): Secondary | ICD-10-CM | POA: Diagnosis not present

## 2019-10-11 DIAGNOSIS — I480 Paroxysmal atrial fibrillation: Secondary | ICD-10-CM | POA: Diagnosis not present

## 2019-10-11 DIAGNOSIS — I10 Essential (primary) hypertension: Secondary | ICD-10-CM | POA: Diagnosis not present

## 2019-10-11 DIAGNOSIS — I2510 Atherosclerotic heart disease of native coronary artery without angina pectoris: Secondary | ICD-10-CM | POA: Diagnosis not present

## 2019-10-22 ENCOUNTER — Other Ambulatory Visit: Payer: Self-pay | Admitting: Family Medicine

## 2019-11-13 DIAGNOSIS — Z01810 Encounter for preprocedural cardiovascular examination: Secondary | ICD-10-CM | POA: Diagnosis not present

## 2019-11-13 DIAGNOSIS — Z713 Dietary counseling and surveillance: Secondary | ICD-10-CM | POA: Diagnosis not present

## 2019-11-13 DIAGNOSIS — Z01818 Encounter for other preprocedural examination: Secondary | ICD-10-CM | POA: Diagnosis not present

## 2019-11-13 DIAGNOSIS — E785 Hyperlipidemia, unspecified: Secondary | ICD-10-CM | POA: Diagnosis not present

## 2019-11-13 DIAGNOSIS — Z6841 Body Mass Index (BMI) 40.0 and over, adult: Secondary | ICD-10-CM | POA: Diagnosis not present

## 2019-11-18 ENCOUNTER — Other Ambulatory Visit: Payer: Self-pay | Admitting: Family Medicine

## 2019-11-27 ENCOUNTER — Encounter: Payer: Self-pay | Admitting: Family Medicine

## 2019-11-27 ENCOUNTER — Other Ambulatory Visit: Payer: Self-pay

## 2019-11-27 ENCOUNTER — Ambulatory Visit (INDEPENDENT_AMBULATORY_CARE_PROVIDER_SITE_OTHER): Payer: Medicare Other | Admitting: Family Medicine

## 2019-11-27 VITALS — BP 120/74 | HR 60 | Temp 96.5°F | Resp 16 | Ht 73.0 in | Wt 312.0 lb

## 2019-11-27 DIAGNOSIS — D485 Neoplasm of uncertain behavior of skin: Secondary | ICD-10-CM

## 2019-11-27 DIAGNOSIS — M25561 Pain in right knee: Secondary | ICD-10-CM | POA: Diagnosis not present

## 2019-11-27 DIAGNOSIS — N289 Disorder of kidney and ureter, unspecified: Secondary | ICD-10-CM | POA: Diagnosis not present

## 2019-11-27 DIAGNOSIS — L989 Disorder of the skin and subcutaneous tissue, unspecified: Secondary | ICD-10-CM

## 2019-11-27 LAB — URINALYSIS, ROUTINE W REFLEX MICROSCOPIC
Bilirubin Urine: NEGATIVE
Hgb urine dipstick: NEGATIVE
Ketones, ur: NEGATIVE
Leukocytes,Ua: NEGATIVE
Nitrite: NEGATIVE
Protein, ur: NEGATIVE
Specific Gravity, Urine: 1.015 (ref 1.001–1.03)
pH: 5 (ref 5.0–8.0)

## 2019-11-27 MED ORDER — INSULIN GLARGINE 100 UNIT/ML ~~LOC~~ SOLN: 50.0000 [IU] | Freq: Two times a day (BID) | SUBCUTANEOUS | 3 refills | Status: DC

## 2019-11-27 NOTE — Progress Notes (Signed)
Subjective:    Patient ID: Ronald Dorsey, male    DOB: 1950/12/01, 69 y.o.   MRN: [id number]  Patient presents today complaining of pain in his right knee.  The pain began 1 week ago.  He was standing to get up when he twisted his right knee he felt a sudden sharp sensation in the knee along the medial joint line.  He now reports pain with flexion and extension of his knee on the medial and lateral joint line.  He also has a small effusion.  He denies any laxity to varus or valgus stress.  He is requesting a cortisone injection in the knee.  He also has a lesion on his upper medial left thigh.  Is 1 cm in diameter.  Is a warty erythematous papule with white scale.  Difficult to determine whether is an irritated seborrheic keratosis versus skin cancer.  Patient also recently went for a preoperative evaluation was found to have an elevation in his creatinine to 1.4 and a BUN of 41.  This is markedly different than before. Past Medical History:  Diagnosis Date  . Apnea   . Arthritis   . Diabetes mellitus without complication ([place])   . Hypercholesterolemia   . Hypertension   . Neuromuscular disorder (HCC)    sciatica  . PSVT (paroxysmal supraventricular tachycardia) ([place])    Past Surgical History:  Procedure Laterality Date  . CARPAL TUNNEL RELEASE  2010   left wrist  . EYE SURGERY    . LEFT HEART CATH AND CORONARY ANGIOGRAPHY N/A 09/23/2018   Procedure: LEFT HEART CATH AND CORONARY ANGIOGRAPHY;  Surgeon: Burnell Blanks, MD;  Location: [facility];  Service: Cardiovascular;  Laterality: N/A;  . TONSILLECTOMY  1961   Current Outpatient Medications on File Prior to Visit  Medication Sig Dispense Refill  . amLODipine (NORVASC) 10 MG tablet TAKE 1 TABLET BY MOUTH EVERY DAY 90 tablet 1  . BD INSULIN SYRINGE U/F 30G X 1/2" 0.5 ML MISC USE 5 TIMES A DAY AS DIRECTED. 400 each 3  . CONTOUR NEXT TEST test strip USE AS DIRECTED TO CHECK BLOOD SUGAR FOUR TIMES DAILY.DX E11.9 300 strip  5  . ELIQUIS 5 MG TABS tablet TAKE 1 TABLET BY MOUTH TWICE A DAY 60 tablet 5  . empagliflozin (JARDIANCE) 25 MG TABS tablet Take 25 mg by mouth daily before breakfast. 90 tablet 1  . ezetimibe (ZETIA) 10 MG tablet TAKE 1 TABLET(10 MG) BY MOUTH DAILY 90 tablet 3  . HUMALOG 100 UNIT/ML injection INJECT 40 UNITS INTO SKIN THREE TIMES DAILY WITH MEALS PER SLIDING SCALE 110 mL 2  . hydrochlorothiazide (HYDRODIURIL) 25 MG tablet Take 1 tablet (25 mg total) by mouth daily. 90 tablet 3  . Insulin Pen Needle 31G X 8 MM MISC Use 1 daily 100 each 5  . liraglutide (VICTOZA) 18 MG/3ML SOPN INJECT 1.2 MG UNDER THE SKIN ONCE DAILY 18 mL 2  . lisinopril (ZESTRIL) 20 MG tablet TAKE 2 TABLETS BY MOUTH EVERY DAY 180 tablet 0  . meloxicam (MOBIC) 7.5 MG tablet TAKE 1 TABLET BY MOUTH DAILY AS NEEDED FOR PAIN AND IMFLAMMATION (Patient taking differently: Take 7.5 mg by mouth daily as needed (for pain or inflammation). ) 90 tablet 0  . metFORMIN (GLUCOPHAGE) 500 MG tablet TAKE 2 TABLETS BY MOUTH TWICE A DAY 360 tablet 1  . metoprolol succinate (TOPROL-XL) 25 MG 24 hr tablet TAKE 1 TABLET BY MOUTH DAILY 90 tablet 2  . omega-3 acid  ethyl esters (LOVAZA) 1 g capsule TAKE 2 CAPSULES BY MOUTH TWICE A DAY 360 capsule 0  . pioglitazone (ACTOS) 30 MG tablet Take 1 tablet (30 mg total) by mouth daily before breakfast. 90 tablet 3  . pregabalin (LYRICA) 150 MG capsule TAKE 1 CAPSULE BY MOUTH TWICE A DAY 180 capsule 1  . rosuvastatin (CRESTOR) 40 MG tablet TAKE 1/2 TABLET BY MOUTH EVERY DAY AT BEDTIME 45 tablet 3   No current facility-administered medications on file prior to visit.   Allergies  Allergen Reactions  . Eggs Or Egg-Derived Products Nausea And Vomiting   Social History   Socioeconomic History  . Marital status: Married    Spouse name: Not on file  . Number of children: Not on file  . Years of education: Not on file  . Highest education level: Not on file  Occupational History  . Not on file  Tobacco Use    . Smoking status: Former Research scientist (life sciences)  . Smokeless tobacco: Former Network engineer and Sexual Activity  . Alcohol use: Yes    Comment: 1 glass of wine a month or less  . Drug use: No  . 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 Not on file  Social History Narrative  . Not on file   Social Determinants of Health   Financial Resource Strain:   . Difficulty of Paying Living Expenses:   Food Insecurity:   . Worried About Charity fundraiser in the Last Year:   . Arboriculturist in the Last Year:   Transportation Needs:   . Film/video editor (Medical):   Marland Kitchen Lack of Transportation (Non-Medical):   Physical Activity:   . Days of Exercise per Week:   . Minutes of Exercise per Session:   Stress:   . Feeling of Stress :   Social Connections:   . Frequency of Communication with Friends and Family:   . Frequency of Social Gatherings with Friends and Family:   . Attends Religious Services:   . Active Member of Clubs or Organizations:   . Attends Archivist Meetings:   Marland Kitchen Marital Status:   Intimate Partner Violence:   . Fear of Current or Ex-Partner:   . Emotionally Abused:   Marland Kitchen Physically Abused:   . Sexually Abused:    Family History  Problem Relation Age of Onset  . Hypertension Father       Review of Systems  All other systems reviewed and are negative.      Objective:   Physical Exam  Constitutional: He appears well-developed and well-nourished. No distress.  Cardiovascular: Normal rate, regular rhythm and normal heart sounds. Exam reveals no gallop and no friction rub.  No murmur heard. Pulmonary/Chest: Effort normal and breath sounds normal. No respiratory distress. He has no wheezes. He has no rales. He exhibits no tenderness.  Musculoskeletal:     Right knee: Swelling present. Decreased range of motion. Tenderness present over the medial joint line and lateral joint line. Normal meniscus.        Legs:  Skin: He is not diaphoretic.  Vitals reviewed.         Assessment & Plan:  Renal insufficiency - Plan: BASIC METABOLIC PANEL WITH GFR, Urinalysis, Routine w reflex microscopic, CBC with Differential/Platelet  Acute pain of right knee  Benign skin lesion of thigh  Repeat a BMP, CBC, and urinalysis.  If labs show persistent elevation in  renal function and urinalysis is normal I will order a renal ultrasound.  Discontinue meloxicam.  I suspect arthritis in the knee versus a meniscal tear.  Using sterile technique, I injected the right knee with 2 cc lidocaine, 2 cc of Marcaine, and 2 cc of 40 mg/mL Kenalog.  The lesion on his upper medial left thigh is either an irritated seborrheic keratosis or possibly a squamous cell carcinoma.  He was treated with liquid nitrogen cryotherapy for 20 seconds.  If it persist I would recommend excisional biopsy.

## 2019-11-28 ENCOUNTER — Other Ambulatory Visit: Payer: Self-pay | Admitting: Family Medicine

## 2019-11-28 DIAGNOSIS — R944 Abnormal results of kidney function studies: Secondary | ICD-10-CM

## 2019-11-28 LAB — CBC WITH DIFFERENTIAL/PLATELET
Absolute Monocytes: 1109 cells/uL — ABNORMAL HIGH (ref 200–950)
Basophils Absolute: 89 cells/uL (ref 0–200)
Basophils Relative: 0.9 %
Eosinophils Absolute: 228 cells/uL (ref 15–500)
Eosinophils Relative: 2.3 %
HCT: 46.8 % (ref 38.5–50.0)
Hemoglobin: 15.3 g/dL (ref 13.2–17.1)
Lymphs Abs: 2485 cells/uL (ref 850–3900)
MCH: 30.1 pg (ref 27.0–33.0)
MCHC: 32.7 g/dL (ref 32.0–36.0)
MCV: 91.9 fL (ref 80.0–100.0)
MPV: 12.2 fL (ref 7.5–12.5)
Monocytes Relative: 11.2 %
Neutro Abs: 5990 cells/uL (ref 1500–7800)
Neutrophils Relative %: 60.5 %
Platelets: 162 10*3/uL (ref 140–400)
RBC: 5.09 10*6/uL (ref 4.20–5.80)
RDW: 13.1 % (ref 11.0–15.0)
Total Lymphocyte: 25.1 %
WBC: 9.9 10*3/uL (ref 3.8–10.8)

## 2019-11-28 LAB — BASIC METABOLIC PANEL WITH GFR
BUN/Creatinine Ratio: 27 (calc) — ABNORMAL HIGH (ref 6–22)
BUN: 32 mg/dL — ABNORMAL HIGH (ref 7–25)
CO2: 27 mmol/L (ref 20–32)
Calcium: 9.8 mg/dL (ref 8.6–10.3)
Chloride: 103 mmol/L (ref 98–110)
Creat: 1.19 mg/dL (ref 0.70–1.25)
GFR, Est African American: 72 mL/min/{1.73_m2} (ref >=60)
GFR, Est Non African American: 62 mL/min/{1.73_m2} (ref >=60)
Glucose, Bld: 98 mg/dL (ref 65–99)
Potassium: 4.6 mmol/L (ref 3.5–5.3)
Sodium: 140 mmol/L (ref 135–146)

## 2019-11-30 DIAGNOSIS — Z20822 Contact with and (suspected) exposure to covid-19: Secondary | ICD-10-CM | POA: Diagnosis not present

## 2019-12-04 DIAGNOSIS — I10 Essential (primary) hypertension: Secondary | ICD-10-CM | POA: Diagnosis not present

## 2019-12-04 DIAGNOSIS — E785 Hyperlipidemia, unspecified: Secondary | ICD-10-CM | POA: Diagnosis not present

## 2019-12-04 DIAGNOSIS — Z79899 Other long term (current) drug therapy: Secondary | ICD-10-CM | POA: Diagnosis not present

## 2019-12-04 DIAGNOSIS — Z7901 Long term (current) use of anticoagulants: Secondary | ICD-10-CM | POA: Diagnosis not present

## 2019-12-04 DIAGNOSIS — E119 Type 2 diabetes mellitus without complications: Secondary | ICD-10-CM | POA: Diagnosis not present

## 2019-12-04 DIAGNOSIS — Z6838 Body mass index (BMI) 38.0-38.9, adult: Secondary | ICD-10-CM | POA: Diagnosis not present

## 2019-12-04 DIAGNOSIS — J45909 Unspecified asthma, uncomplicated: Secondary | ICD-10-CM | POA: Diagnosis not present

## 2019-12-04 DIAGNOSIS — G4733 Obstructive sleep apnea (adult) (pediatric): Secondary | ICD-10-CM | POA: Diagnosis not present

## 2019-12-04 DIAGNOSIS — Z794 Long term (current) use of insulin: Secondary | ICD-10-CM | POA: Diagnosis not present

## 2019-12-04 DIAGNOSIS — R12 Heartburn: Secondary | ICD-10-CM | POA: Diagnosis not present

## 2019-12-07 DIAGNOSIS — Z20822 Contact with and (suspected) exposure to covid-19: Secondary | ICD-10-CM | POA: Diagnosis not present

## 2019-12-07 DIAGNOSIS — Z01812 Encounter for preprocedural laboratory examination: Secondary | ICD-10-CM | POA: Diagnosis not present

## 2019-12-11 DIAGNOSIS — Z7901 Long term (current) use of anticoagulants: Secondary | ICD-10-CM | POA: Diagnosis not present

## 2019-12-11 DIAGNOSIS — I10 Essential (primary) hypertension: Secondary | ICD-10-CM | POA: Diagnosis not present

## 2019-12-11 DIAGNOSIS — Z87891 Personal history of nicotine dependence: Secondary | ICD-10-CM | POA: Diagnosis not present

## 2019-12-11 DIAGNOSIS — G4733 Obstructive sleep apnea (adult) (pediatric): Secondary | ICD-10-CM | POA: Diagnosis present

## 2019-12-11 DIAGNOSIS — Z6841 Body Mass Index (BMI) 40.0 and over, adult: Secondary | ICD-10-CM | POA: Diagnosis not present

## 2019-12-11 DIAGNOSIS — I480 Paroxysmal atrial fibrillation: Secondary | ICD-10-CM | POA: Diagnosis present

## 2019-12-11 DIAGNOSIS — Z6838 Body mass index (BMI) 38.0-38.9, adult: Secondary | ICD-10-CM | POA: Diagnosis not present

## 2019-12-11 DIAGNOSIS — M1990 Unspecified osteoarthritis, unspecified site: Secondary | ICD-10-CM | POA: Diagnosis present

## 2019-12-11 DIAGNOSIS — Z7984 Long term (current) use of oral hypoglycemic drugs: Secondary | ICD-10-CM | POA: Diagnosis not present

## 2019-12-11 DIAGNOSIS — E785 Hyperlipidemia, unspecified: Secondary | ICD-10-CM | POA: Diagnosis present

## 2019-12-11 DIAGNOSIS — E119 Type 2 diabetes mellitus without complications: Secondary | ICD-10-CM | POA: Diagnosis not present

## 2019-12-11 DIAGNOSIS — R12 Heartburn: Secondary | ICD-10-CM | POA: Diagnosis not present

## 2019-12-28 ENCOUNTER — Other Ambulatory Visit: Payer: Self-pay

## 2019-12-28 ENCOUNTER — Ambulatory Visit
Admission: RE | Admit: 2019-12-28 | Discharge: 2019-12-28 | Disposition: A | Discharge status: Home / Self Care | Payer: Medicare Other | Source: Ambulatory Visit | Attending: Family Medicine | Admitting: Family Medicine

## 2019-12-28 ENCOUNTER — Ambulatory Visit (INDEPENDENT_AMBULATORY_CARE_PROVIDER_SITE_OTHER): Payer: Medicare Other | Admitting: Family Medicine

## 2019-12-28 ENCOUNTER — Encounter: Payer: Self-pay | Admitting: Family Medicine

## 2019-12-28 VITALS — BP 100/54 | HR 80 | Temp 97.2°F | Resp 18 | Ht 73.0 in | Wt 266.0 lb

## 2019-12-28 DIAGNOSIS — R05 Cough: Secondary | ICD-10-CM

## 2019-12-28 DIAGNOSIS — E119 Type 2 diabetes mellitus without complications: Secondary | ICD-10-CM

## 2019-12-28 DIAGNOSIS — Z794 Long term (current) use of insulin: Secondary | ICD-10-CM

## 2019-12-28 DIAGNOSIS — I951 Orthostatic hypotension: Secondary | ICD-10-CM

## 2019-12-28 DIAGNOSIS — R059 Cough, unspecified: Secondary | ICD-10-CM

## 2019-12-28 MED ORDER — ROSUVASTATIN CALCIUM 20 MG PO TABS
20.0000 mg | ORAL_TABLET | Freq: Every day | ORAL | 3 refills | Status: DC
Start: 2019-12-28 — End: 2020-05-20

## 2019-12-28 NOTE — Progress Notes (Signed)
Subjective:    Patient ID: Ronald Dorsey, male    DOB: Apr 30, 1951, 69 y.o.   MRN: [id number]  Patient is a very pleasant 69 year old Caucasian male here today for follow-up.  2 weeks ago he had gastric bypass surgery.  He has lost roughly 40 pounds.  His surgeon discontinued most of his medication.  He is only on metoprolol 25 mg twice daily, lisinopril 40 mg daily, Jardiance 25 mg daily, and Humalog 5 to 10 units twice daily with meals.  His blood sugars have been around 200 fasting in the morning.  His 5 to 10 units of Humalog with meals will drop into the 180s however they do not fully control his blood sugar.  He reports feeling weak and tired.  His blood pressure today is extremely low at 154.  He does report some orthostatic dizziness.  He denies any chest pain however he does have some tightness in his chest and a cough that is productive of thick hard mucus.  This is been present for 2 weeks ever since his surgery.  He attributed to allergies and sinus congestion.  He denies any fevers or chills or pleurisy or hemoptysis he does have yellow and brown sputum and a persistent cough particularly first thing in the mornings. Past Medical History:  Diagnosis Date  . Apnea   . Arthritis   . Diabetes mellitus without complication ([place])   . Hypercholesterolemia   . Hypertension   . Neuromuscular disorder (HCC)    sciatica  . PSVT (paroxysmal supraventricular tachycardia) ([place])    Past Surgical History:  Procedure Laterality Date  . CARPAL TUNNEL RELEASE  2010   left wrist  . EYE SURGERY    . LEFT HEART CATH AND CORONARY ANGIOGRAPHY N/A 09/23/2018   Procedure: LEFT HEART CATH AND CORONARY ANGIOGRAPHY;  Surgeon: Burnell Blanks, MD;  Location: [facility];  Service: Cardiovascular;  Laterality: N/A;  . TONSILLECTOMY  1961   Current Outpatient Medications on File Prior to Visit  Medication Sig Dispense Refill  . empagliflozin (JARDIANCE) 25 MG TABS tablet Take 25 mg by mouth  daily before breakfast. 90 tablet 1  . HUMALOG 100 UNIT/ML injection INJECT 40 UNITS INTO SKIN THREE TIMES DAILY WITH MEALS PER SLIDING SCALE 110 mL 2  . lisinopril (ZESTRIL) 20 MG tablet TAKE 2 TABLETS BY MOUTH EVERY DAY 180 tablet 0  . metoprolol succinate (TOPROL-XL) 25 MG 24 hr tablet TAKE 1 TABLET BY MOUTH DAILY 90 tablet 2  . amLODipine (NORVASC) 10 MG tablet TAKE 1 TABLET BY MOUTH EVERY DAY (Patient not taking: Reported on 12/28/2019) 90 tablet 1  . ELIQUIS 5 MG TABS tablet TAKE 1 TABLET BY MOUTH TWICE A DAY (Patient not taking: Reported on 12/28/2019) 60 tablet 5  . ezetimibe (ZETIA) 10 MG tablet TAKE 1 TABLET(10 MG) BY MOUTH DAILY (Patient not taking: Reported on 12/28/2019) 90 tablet 3  . hydrochlorothiazide (HYDRODIURIL) 25 MG tablet Take 1 tablet (25 mg total) by mouth daily. (Patient not taking: Reported on 12/28/2019) 90 tablet 3  . insulin glargine (LANTUS) 100 UNIT/ML injection Inject 0.5 mLs (50 Units total) into the skin 2 (two) times daily with a meal. (Patient not taking: Reported on 12/28/2019) 90 mL 3  . Insulin Pen Needle 31G X 8 MM MISC Use 1 daily (Patient not taking: Reported on 12/28/2019) 100 each 5  . liraglutide (VICTOZA) 18 MG/3ML SOPN INJECT 1.2 MG UNDER THE SKIN ONCE DAILY (Patient not taking: Reported on 12/28/2019) 18 mL  2  . meloxicam (MOBIC) 7.5 MG tablet TAKE 1 TABLET BY MOUTH DAILY AS NEEDED FOR PAIN AND IMFLAMMATION (Patient not taking: No sig reported) 90 tablet 0  . metFORMIN (GLUCOPHAGE) 500 MG tablet TAKE 2 TABLETS BY MOUTH TWICE A DAY (Patient not taking: Reported on 12/28/2019) 360 tablet 1  . omega-3 acid ethyl esters (LOVAZA) 1 g capsule TAKE 2 CAPSULES BY MOUTH TWICE A DAY (Patient not taking: Reported on 12/28/2019) 360 capsule 0  . pioglitazone (ACTOS) 30 MG tablet Take 1 tablet (30 mg total) by mouth daily before breakfast. (Patient not taking: Reported on 12/28/2019) 90 tablet 3  . pregabalin (LYRICA) 150 MG capsule TAKE 1 CAPSULE BY MOUTH TWICE A DAY (Patient not  taking: Reported on 12/28/2019) 180 capsule 1  . rosuvastatin (CRESTOR) 40 MG tablet TAKE 1/2 TABLET BY MOUTH EVERY DAY AT BEDTIME (Patient not taking: Reported on 12/28/2019) 45 tablet 3   No current facility-administered medications on file prior to visit.   Allergies  Allergen Reactions  . Eggs Or Egg-Derived Products Nausea And Vomiting   Social History   Socioeconomic History  . Marital status: Married    Spouse name: Not on file  . Number of children: Not on file  . Years of education: Not on file  . Highest education level: Not on file  Occupational History  . Not on file  Tobacco Use  . Smoking status: Former Research scientist (life sciences)  . Smokeless tobacco: Former Network engineer and Sexual Activity  . Alcohol use: Yes    Comment: 1 glass of wine a month or less  . Drug use: No  . 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 Not on file  Social History Narrative  . Not on file   Social Determinants of Health   Financial Resource Strain:   . Difficulty of Paying Living Expenses:   Food Insecurity:   . Worried About Charity fundraiser in the Last Year:   . Arboriculturist in the Last Year:   Transportation Needs:   . Film/video editor (Medical):   Marland Kitchen Lack of Transportation (Non-Medical):   Physical Activity:   . Days of Exercise per Week:   . Minutes of Exercise per Session:   Stress:   . Feeling of Stress :   Social Connections:   . Frequency of Communication with Friends and Family:   . Frequency of Social Gatherings with Friends and Family:   . Attends Religious Services:   . Active Member of Clubs or Organizations:   . Attends Archivist Meetings:   Marland Kitchen Marital Status:   Intimate Partner Violence:   . Fear of Current or Ex-Partner:   . Emotionally Abused:   Marland Kitchen Physically Abused:   . Sexually Abused:    Family History  Problem Relation Age of Onset  . Hypertension Father       Review of Systems  All other  systems reviewed and are negative.      Objective:   Physical Exam  Constitutional: He is oriented to person, place, and time. He appears well-developed and well-nourished. No distress.  HENT:  Head: Normocephalic and atraumatic.  Right Ear: External ear normal.  Left Ear: External ear normal.  Nose: Nose normal.  Mouth/Throat: Oropharynx is clear and moist. No oropharyngeal exudate.  Eyes: Pupils are equal, round, and reactive to light. Conjunctivae and EOM are normal. Right eye exhibits no discharge. Left  eye exhibits no discharge. No scleral icterus.  Neck: No JVD present. No tracheal deviation present. No thyromegaly present.  Cardiovascular: Normal rate, regular rhythm and normal heart sounds. Exam reveals no gallop and no friction rub.  No murmur heard. Pulmonary/Chest: Effort normal and breath sounds normal. No stridor. No respiratory distress. He has no wheezes. He has no rales. He exhibits no tenderness.  Abdominal: Soft. Bowel sounds are normal. He exhibits no distension and no mass. There is no abdominal tenderness. There is no rebound and no guarding.  Musculoskeletal:        General: No tenderness, deformity or edema. Normal range of motion.     Cervical back: Normal range of motion and neck supple.  Lymphadenopathy:    He has no cervical adenopathy.  Neurological: He is alert and oriented to person, place, and time. He has normal reflexes. No cranial nerve deficit. He exhibits normal muscle tone. Coordination normal.  Skin: Skin is warm. No rash noted. He is not diaphoretic. No erythema. No pallor.  Psychiatric: He has a normal mood and affect. His behavior is normal. Judgment and thought content normal.  Vitals reviewed.         Assessment & Plan:  Diabetes mellitus, type II, insulin dependent ([place]) - Plan: CBC with Differential/Platelet, COMPLETE METABOLIC PANEL WITH GFR, Lipid panel, Hemoglobin A1c, Microalbumin, urine  Cough - Plan: DG Chest 2  View  Orthostatic hypotension  Blood pressure is low today and the patient appears dehydrated.  I recommended that he discontinue lisinopril at the present time and maintain on metoprolol given his history of PSVT.  Patient is a diabetic and they did discontinue his statin.  I we will resume Crestor 20 mg a day due to his history of diabetes.  I have recommended that he start Lantus 10 units daily as a basal insulin and increase by 1 unit every day until fasting blood sugars are less than 130.  Continue Humalog 5 to 10 units with meals.  Recheck via telephone in 1 week or immediately if worsening.  Lungs are clear to auscultation today.  However given his recent surgery and now his productive cough I recommended a chest x-ray to rule out hospital-acquired pneumonia.

## 2019-12-29 LAB — COMPLETE METABOLIC PANEL WITH GFR
AG Ratio: 1.5 (calc) (ref 1.0–2.5)
ALT: 24 U/L (ref 9–46)
AST: 19 U/L (ref 10–35)
Albumin: 4.1 g/dL (ref 3.6–5.1)
Alkaline phosphatase (APISO): 70 U/L (ref 35–144)
BUN/Creatinine Ratio: 30 (calc) — ABNORMAL HIGH (ref 6–22)
BUN: 48 mg/dL — ABNORMAL HIGH (ref 7–25)
CO2: 21 mmol/L (ref 20–32)
Calcium: 10.1 mg/dL (ref 8.6–10.3)
Chloride: 100 mmol/L (ref 98–110)
Creat: 1.61 mg/dL — ABNORMAL HIGH (ref 0.70–1.25)
GFR, Est African American: 50 mL/min/{1.73_m2} — ABNORMAL LOW (ref >=60)
GFR, Est Non African American: 43 mL/min/{1.73_m2} — ABNORMAL LOW (ref >=60)
Globulin: 2.8 g/dL (calc) (ref 1.9–3.7)
Glucose, Bld: 215 mg/dL — ABNORMAL HIGH (ref 65–99)
Potassium: 4.8 mmol/L (ref 3.5–5.3)
Sodium: 139 mmol/L (ref 135–146)
Total Bilirubin: 0.7 mg/dL (ref 0.2–1.2)
Total Protein: 6.9 g/dL (ref 6.1–8.1)

## 2019-12-29 LAB — LIPID PANEL
Cholesterol: 171 mg/dL (ref <200)
HDL: 28 mg/dL — ABNORMAL LOW (ref >=40)
LDL Cholesterol (Calc): 112 mg/dL (calc) — ABNORMAL HIGH
Non-HDL Cholesterol (Calc): 143 mg/dL (calc) — ABNORMAL HIGH (ref <130)
Total CHOL/HDL Ratio: 6.1 (calc) — ABNORMAL HIGH (ref <5.0)
Triglycerides: 195 mg/dL — ABNORMAL HIGH (ref <150)

## 2019-12-29 LAB — HEMOGLOBIN A1C
Hgb A1c MFr Bld: 8.3 % of total Hgb — ABNORMAL HIGH (ref <5.7)
Mean Plasma Glucose: 192 (calc)
eAG (mmol/L): 10.6 (calc)

## 2019-12-29 LAB — CBC WITH DIFFERENTIAL/PLATELET
Absolute Monocytes: 1109 cells/uL — ABNORMAL HIGH (ref 200–950)
Basophils Absolute: 126 cells/uL (ref 0–200)
Basophils Relative: 1.5 %
Eosinophils Absolute: 202 cells/uL (ref 15–500)
Eosinophils Relative: 2.4 %
HCT: 46.9 % (ref 38.5–50.0)
Hemoglobin: 15.4 g/dL (ref 13.2–17.1)
Lymphs Abs: 2167 cells/uL (ref 850–3900)
MCH: 29.6 pg (ref 27.0–33.0)
MCHC: 32.8 g/dL (ref 32.0–36.0)
MCV: 90 fL (ref 80.0–100.0)
MPV: 12 fL (ref 7.5–12.5)
Monocytes Relative: 13.2 %
Neutro Abs: 4796 cells/uL (ref 1500–7800)
Neutrophils Relative %: 57.1 %
Platelets: 286 10*3/uL (ref 140–400)
RBC: 5.21 10*6/uL (ref 4.20–5.80)
RDW: 13.4 % (ref 11.0–15.0)
Total Lymphocyte: 25.8 %
WBC: 8.4 10*3/uL (ref 3.8–10.8)

## 2019-12-29 LAB — MICROALBUMIN, URINE: Microalb, Ur: 2.2 mg/dL

## 2020-02-16 ENCOUNTER — Encounter: Payer: Self-pay | Admitting: Family Medicine

## 2020-02-16 MED ORDER — FREESTYLE LIBRE 14 DAY SENSOR MISC: 11 refills | Status: DC

## 2020-02-16 MED ORDER — FREESTYLE LIBRE 14 DAY READER DEVI: 1 refills | Status: DC

## 2020-02-20 MED ORDER — FREESTYLE LIBRE 14 DAY SENSOR MISC: 11 refills | Status: DC

## 2020-02-20 MED ORDER — FREESTYLE LIBRE 14 DAY READER DEVI: 1 refills | Status: DC

## 2020-02-20 NOTE — Addendum Note (Signed)
Addended by: Sheral Flow on: 02/20/2020 03:34 PM   Modules accepted: Orders

## 2020-03-15 ENCOUNTER — Other Ambulatory Visit: Payer: Self-pay | Admitting: Family Medicine

## 2020-03-18 DIAGNOSIS — Z713 Dietary counseling and surveillance: Secondary | ICD-10-CM | POA: Diagnosis not present

## 2020-03-18 DIAGNOSIS — Z6835 Body mass index (BMI) 35.0-35.9, adult: Secondary | ICD-10-CM | POA: Diagnosis not present

## 2020-05-13 ENCOUNTER — Encounter: Payer: Self-pay | Admitting: Family Medicine

## 2020-05-14 MED ORDER — INSULIN LISPRO 100 UNIT/ML ~~LOC~~ SOLN: 40.0000 [IU] | Freq: Three times a day (TID) | SUBCUTANEOUS | 2 refills | Status: DC

## 2020-05-14 MED ORDER — METFORMIN HCL 500 MG PO TABS: 1000.0000 mg | ORAL_TABLET | Freq: Two times a day (BID) | ORAL | 1 refills | Status: DC

## 2020-05-15 ENCOUNTER — Telehealth: Payer: Self-pay | Admitting: *Deleted

## 2020-05-15 NOTE — Telephone Encounter (Signed)
Received message via MyChart that Humalog insulin was not able to be filled.   Contacted pharmacy and was advised tht there are (2) issues with prescription: 1. Per Medicare guidelines, SSI must have clear directions and a max daily dose to be filled.  2. Co-Pay is $132. Novolog is not covered. Pharmacy is unsure if there is a deductible or if patient is in donut hole.   Please advise.

## 2020-05-16 MED ORDER — INSULIN LISPRO 100 UNIT/ML ~~LOC~~ SOLN: 0.0000 [IU] | Freq: Three times a day (TID) | SUBCUTANEOUS | 2 refills | Status: DC

## 2020-05-16 NOTE — Telephone Encounter (Signed)
Tell them 90 units per day per patients report.

## 2020-05-16 NOTE — Telephone Encounter (Signed)
Prescription sent to pharmacy.

## 2020-05-20 ENCOUNTER — Other Ambulatory Visit: Payer: Self-pay

## 2020-05-20 ENCOUNTER — Ambulatory Visit (INDEPENDENT_AMBULATORY_CARE_PROVIDER_SITE_OTHER): Payer: Medicare Other | Admitting: Family Medicine

## 2020-05-20 ENCOUNTER — Encounter: Payer: Self-pay | Admitting: Family Medicine

## 2020-05-20 VITALS — BP 110/58 | HR 69 | Temp 97.6°F | Ht 73.0 in | Wt 246.0 lb

## 2020-05-20 DIAGNOSIS — I10 Essential (primary) hypertension: Secondary | ICD-10-CM

## 2020-05-20 DIAGNOSIS — N289 Disorder of kidney and ureter, unspecified: Secondary | ICD-10-CM | POA: Diagnosis not present

## 2020-05-20 DIAGNOSIS — I480 Paroxysmal atrial fibrillation: Secondary | ICD-10-CM | POA: Insufficient documentation

## 2020-05-20 DIAGNOSIS — E119 Type 2 diabetes mellitus without complications: Secondary | ICD-10-CM

## 2020-05-20 DIAGNOSIS — Z794 Long term (current) use of insulin: Secondary | ICD-10-CM | POA: Diagnosis not present

## 2020-05-20 DIAGNOSIS — E785 Hyperlipidemia, unspecified: Secondary | ICD-10-CM

## 2020-05-20 MED ORDER — EMPAGLIFLOZIN 25 MG PO TABS: ORAL_TABLET | ORAL | 3 refills | Status: DC

## 2020-05-20 MED ORDER — PIOGLITAZONE HCL 30 MG PO TABS: 30.0000 mg | ORAL_TABLET | Freq: Every day | ORAL | 3 refills | Status: DC

## 2020-05-20 MED ORDER — EZETIMIBE 10 MG PO TABS: ORAL_TABLET | ORAL | 3 refills | Status: DC

## 2020-05-20 MED ORDER — HYDROCHLOROTHIAZIDE 25 MG PO TABS
25.0000 mg | ORAL_TABLET | Freq: Every day | ORAL | 3 refills | Status: DC
Start: 2020-05-20 — End: 2021-06-27

## 2020-05-20 MED ORDER — APIXABAN 5 MG PO TABS: 5.0000 mg | ORAL_TABLET | Freq: Two times a day (BID) | ORAL | 5 refills | Status: DC

## 2020-05-20 MED ORDER — ROSUVASTATIN CALCIUM 20 MG PO TABS
20.0000 mg | ORAL_TABLET | Freq: Every day | ORAL | 3 refills | Status: DC
Start: 2020-05-20 — End: 2021-06-27

## 2020-05-20 MED ORDER — METFORMIN HCL 500 MG PO TABS: 1000.0000 mg | ORAL_TABLET | Freq: Two times a day (BID) | ORAL | 2 refills | Status: DC

## 2020-05-20 MED ORDER — METOPROLOL SUCCINATE ER 25 MG PO TB24: ORAL_TABLET | ORAL | 3 refills | Status: DC

## 2020-05-20 MED ORDER — AMLODIPINE BESYLATE 10 MG PO TABS: 10.0000 mg | ORAL_TABLET | Freq: Every day | ORAL | 3 refills | Status: DC

## 2020-05-20 NOTE — Progress Notes (Signed)
Subjective:    Patient ID: Ronald Dorsey, male    DOB: Mar 27, 1951, 69 y.o.   MRN: [id number] 5/21 Patient is a very pleasant 69 year old Caucasian male here today for follow-up.  2 weeks ago he had gastric bypass surgery.  He has lost roughly 40 pounds.  His surgeon discontinued most of his medication.  He is only on metoprolol 25 mg twice daily, lisinopril 40 mg daily, Jardiance 25 mg daily, and Humalog 5 to 10 units twice daily with meals.  His blood sugars have been around 200 fasting in the morning.  His 5 to 10 units of Humalog with meals will drop into the 180s however they do not fully control his blood sugar.  He reports feeling weak and tired.  His blood pressure today is extremely low at 154.  He does report some orthostatic dizziness.  He denies any chest pain however he does have some tightness in his chest and a cough that is productive of thick hard mucus.  This is been present for 2 weeks ever since his surgery.  He attributed to allergies and sinus congestion.  He denies any fevers or chills or pleurisy or hemoptysis he does have yellow and brown sputum and a persistent cough particularly first thing in the mornings.  At that time, my plan was: Blood pressure is low today and the patient appears dehydrated.  I recommended that he discontinue lisinopril at the present time and maintain on metoprolol given his history of PSVT.  Patient is a diabetic and they did discontinue his statin.  I we will resume Crestor 20 mg a day due to his history of diabetes.  I have recommended that he start Lantus 10 units daily as a basal insulin and increase by 1 unit every day until fasting blood sugars are less than 130.  Continue Humalog 5 to 10 units with meals.  Recheck via telephone in 1 week or immediately if worsening.  Lungs are clear to auscultation today.  However given his recent surgery and now his productive cough I recommended a chest x-ray to rule out hospital-acquired pneumonia.  05/20/20 Wt  Readings from Last 3 Encounters:  05/20/20 246 lb (111.6 kg)  12/28/19 266 lb (120.7 kg)  11/27/19 (!) 312 lb (141.5 kg)  After last saw the patient, he continues to lose weight.  Weight is down to 246 pounds today.  However he has been seeing elevated blood sugars.  Sometimes his postprandial sugars can be well over 200.  Therefore about 1 week ago he resumed his Lantus.  He started back on 50 units daily at night.  He also resumed his Jardiance and Actos and Metformin at some point however he is not certain exactly when he did it.  He did this on his own as he saw his blood sugars are rising.  Therefore his medicine list does not reflect medication he has been taking for 3 months straight.  He denies any polyuria, polydipsia, blurry vision.  He was able to stop Lyrica as he was not experiencing neuropathic pain.  He denies any chest pain shortness of breath or dyspnea on exertion.  Blood pressure today is excellent.  He is not sure if he stopped his lisinopril.  At his last visit we discontinued lisinopril due to hypotension and renal insufficiency.  He believes he may or may not have stopped that he is uncertain.  The remainder of his medications he is taking  Past Medical History:  Diagnosis Date  . Apnea   .  Arthritis   . Diabetes mellitus without complication ([place])   . Hypercholesterolemia   . Hypertension   . Neuromuscular disorder (HCC)    sciatica  . PSVT (paroxysmal supraventricular tachycardia) ([place])    Past Surgical History:  Procedure Laterality Date  . CARPAL TUNNEL RELEASE  2010   left wrist  . EYE SURGERY    . LEFT HEART CATH AND CORONARY ANGIOGRAPHY N/A 09/23/2018   Procedure: LEFT HEART CATH AND CORONARY ANGIOGRAPHY;  Surgeon: Burnell Blanks, MD;  Location: [facility];  Service: Cardiovascular;  Laterality: N/A;  . TONSILLECTOMY  1961   Current Outpatient Medications on File Prior to Visit  Medication Sig Dispense Refill  . amLODipine (NORVASC) 10 MG  tablet TAKE 1 TABLET BY MOUTH EVERY DAY (Patient not taking: Reported on 12/28/2019) 90 tablet 1  . Continuous Blood Gluc Receiver (FREESTYLE LIBRE 14 DAY READER) DEVI Use as directed to monitor blood glucose continuously. 1 each 1  . Continuous Blood Gluc Sensor (FREESTYLE LIBRE 14 DAY SENSOR) MISC Use as directed to monitor blood glucose continuously. Replace sensor Q14 days. 2 each 11  . ELIQUIS 5 MG TABS tablet TAKE 1 TABLET BY MOUTH TWICE A DAY (Patient not taking: Reported on 12/28/2019) 60 tablet 5  . ezetimibe (ZETIA) 10 MG tablet TAKE 1 TABLET(10 MG) BY MOUTH DAILY (Patient not taking: Reported on 12/28/2019) 90 tablet 3  . hydrochlorothiazide (HYDRODIURIL) 25 MG tablet Take 1 tablet (25 mg total) by mouth daily. (Patient not taking: Reported on 12/28/2019) 90 tablet 3  . insulin glargine (LANTUS) 100 UNIT/ML injection Inject 0.5 mLs (50 Units total) into the skin 2 (two) times daily with a meal. (Patient not taking: Reported on 12/28/2019) 90 mL 3  . insulin lispro (HUMALOG) 100 UNIT/ML injection Inject 0-0.3 mLs (0-30 Units total) into the skin 3 (three) times daily with meals. Per sliding scale. MAX DAILY DOSE 90U 110 mL 2  . Insulin Pen Needle 31G X 8 MM MISC Use 1 daily (Patient not taking: Reported on 12/28/2019) 100 each 5  . JARDIANCE 25 MG TABS tablet TAKE 1 TABLET BY MOUTH EVERY MORNING BEFORE BREAKFAST 90 tablet 0  . liraglutide (VICTOZA) 18 MG/3ML SOPN INJECT 1.2 MG UNDER THE SKIN ONCE DAILY (Patient not taking: Reported on 12/28/2019) 18 mL 2  . lisinopril (ZESTRIL) 20 MG tablet TAKE 2 TABLETS BY MOUTH EVERY DAY 180 tablet 0  . meloxicam (MOBIC) 7.5 MG tablet TAKE 1 TABLET BY MOUTH DAILY AS NEEDED FOR PAIN AND IMFLAMMATION (Patient not taking: No sig reported) 90 tablet 0  . metFORMIN (GLUCOPHAGE) 500 MG tablet Take 2 tablets (1,000 mg total) by mouth 2 (two) times daily. 360 tablet 1  . metoprolol succinate (TOPROL-XL) 25 MG 24 hr tablet TAKE 1 TABLET BY MOUTH DAILY 90 tablet 2  . omega-3  acid ethyl esters (LOVAZA) 1 g capsule TAKE 2 CAPSULES BY MOUTH TWICE A DAY (Patient not taking: Reported on 12/28/2019) 360 capsule 0  . pioglitazone (ACTOS) 30 MG tablet Take 1 tablet (30 mg total) by mouth daily before breakfast. (Patient not taking: Reported on 12/28/2019) 90 tablet 3  . pregabalin (LYRICA) 150 MG capsule TAKE 1 CAPSULE BY MOUTH TWICE A DAY (Patient not taking: Reported on 12/28/2019) 180 capsule 1  . rosuvastatin (CRESTOR) 20 MG tablet Take 1 tablet (20 mg total) by mouth daily. 90 tablet 3  . rosuvastatin (CRESTOR) 40 MG tablet TAKE 1/2 TABLET BY MOUTH EVERY DAY AT BEDTIME (Patient not taking: Reported on  12/28/2019) 45 tablet 3   No current facility-administered medications on file prior to visit.   Allergies  Allergen Reactions  . Eggs Or Egg-Derived Products Nausea And Vomiting   Social History   Socioeconomic History  . Marital status: Married    Spouse name: Not on file  . Number of children: Not on file  . Years of education: Not on file  . Highest education level: Not on file  Occupational History  . Not on file  Tobacco Use  . Smoking status: Former Research scientist (life sciences)  . Smokeless tobacco: Former Network engineer and Sexual Activity  . Alcohol use: Yes    Comment: 1 glass of wine a month or less  . Drug use: No  . 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 Not on file  Social History Narrative  . Not on file   Social Determinants of Health   Financial Resource Strain:   . Difficulty of Paying Living Expenses: Not on file  Food Insecurity:   . Worried About Charity fundraiser in the Last Year: Not on file  . Ran Out of Food in the Last Year: Not on file  Transportation Needs:   . Lack of Transportation (Medical): Not on file  . Lack of Transportation (Non-Medical): Not on file  Physical Activity:   . Days of Exercise per Week: Not on file  . Minutes of Exercise per Session: Not on file  Stress:   . Feeling of  Stress : Not on file  Social Connections:   . Frequency of Communication with Friends and Family: Not on file  . Frequency of Social Gatherings with Friends and Family: Not on file  . Attends Religious Services: Not on file  . Active Member of Clubs or Organizations: Not on file  . Attends Archivist Meetings: Not on file  . Marital Status: Not on file  Intimate Partner Violence:   . Fear of Current or Ex-Partner: Not on file  . Emotionally Abused: Not on file  . Physically Abused: Not on file  . Sexually Abused: Not on file   Family History  Problem Relation Age of Onset  . Hypertension Father       Review of Systems  All other systems reviewed and are negative.      Objective:   Physical Exam Vitals reviewed.  Constitutional:      General: He is not in acute distress.    Appearance: He is well-developed. He is not diaphoretic.  HENT:     Head: Normocephalic and atraumatic.     Right Ear: External ear normal.     Left Ear: External ear normal.     Nose: Nose normal.     Mouth/Throat:     Pharynx: No oropharyngeal exudate.  Eyes:     General: No scleral icterus.       Right eye: No discharge.        Left eye: No discharge.     Conjunctiva/sclera: Conjunctivae normal.     Pupils: Pupils are equal, round, and reactive to light.  Neck:     Thyroid: No thyromegaly.     Vascular: No JVD.     Trachea: No tracheal deviation.  Cardiovascular:     Rate and Rhythm: Normal rate and regular rhythm.     Heart sounds: Normal heart sounds. No murmur heard.  No friction rub. No gallop.   Pulmonary:  Effort: Pulmonary effort is normal. No respiratory distress.     Breath sounds: Normal breath sounds. No stridor. No wheezing or rales.  Chest:     Chest wall: No tenderness.  Abdominal:     General: Bowel sounds are normal. There is no distension.     Palpations: Abdomen is soft. There is no mass.     Tenderness: There is no abdominal tenderness. There is no  guarding or rebound.  Musculoskeletal:        General: No tenderness or deformity. Normal range of motion.     Cervical back: Normal range of motion and neck supple.  Lymphadenopathy:     Cervical: No cervical adenopathy.  Skin:    General: Skin is warm.     Coloration: Skin is not pale.     Findings: No erythema or rash.  Neurological:     Mental Status: He is alert and oriented to person, place, and time.     Cranial Nerves: No cranial nerve deficit.     Motor: No abnormal muscle tone.     Coordination: Coordination normal.     Deep Tendon Reflexes: Reflexes are normal and symmetric.  Psychiatric:        Behavior: Behavior normal.        Thought Content: Thought content normal.        Judgment: Judgment normal.           Assessment & Plan:  Diabetes mellitus, type II, insulin dependent (HCC) - Plan: Hemoglobin A1c, COMPLETE METABOLIC PANEL WITH GFR, Lipid panel, Microalbumin, urine  Renal insufficiency  Essential hypertension  Hyperlipidemia, unspecified hyperlipidemia type  Patient will verify with me whether he was on lisinopril.  Recheck a CMP today.  If creatinine remains elevated, I will obtain a renal ultrasound and have the patient discontinue lisinopril.  Stay away from NSAIDs.  Check a urine microalbumin.  Check A1c.  Goal A1c is greater than 7.  A1c is going to be greater than 7 as he has been seeing extremely high sugars up until about a week ago.  Continue Lantus 50 units daily as this seems to be controlling his fasting blood sugars.  However he will need to uptitrate on his Humalog until his 2-hour postprandial sugars are under 180.  He takes between 10 and 15 units of Humalog 3 times a day depending on what he is eating and his postprandial sugars.  Check a fasting lipid panel.  Goal LDL cholesterol is less than 100.

## 2020-05-21 ENCOUNTER — Encounter: Payer: Self-pay | Admitting: Family Medicine

## 2020-05-21 LAB — LIPID PANEL
Cholesterol: 117 mg/dL (ref <200)
HDL: 49 mg/dL (ref >=40)
LDL Cholesterol (Calc): 51 mg/dL (calc)
Non-HDL Cholesterol (Calc): 68 mg/dL (calc) (ref <130)
Total CHOL/HDL Ratio: 2.4 (calc) (ref <5.0)
Triglycerides: 83 mg/dL (ref <150)

## 2020-05-21 LAB — HEMOGLOBIN A1C
Hgb A1c MFr Bld: 8.1 % of total Hgb — ABNORMAL HIGH (ref <5.7)
Mean Plasma Glucose: 186 (calc)
eAG (mmol/L): 10.3 (calc)

## 2020-05-21 LAB — MICROALBUMIN, URINE: Microalb, Ur: 1.4 mg/dL

## 2020-05-21 LAB — COMPLETE METABOLIC PANEL WITH GFR
AG Ratio: 2 (calc) (ref 1.0–2.5)
ALT: 19 U/L (ref 9–46)
AST: 21 U/L (ref 10–35)
Albumin: 4.5 g/dL (ref 3.6–5.1)
Alkaline phosphatase (APISO): 50 U/L (ref 35–144)
BUN/Creatinine Ratio: 26 (calc) — ABNORMAL HIGH (ref 6–22)
BUN: 30 mg/dL — ABNORMAL HIGH (ref 7–25)
CO2: 27 mmol/L (ref 20–32)
Calcium: 9.8 mg/dL (ref 8.6–10.3)
Chloride: 102 mmol/L (ref 98–110)
Creat: 1.15 mg/dL (ref 0.70–1.25)
GFR, Est African American: 75 mL/min/{1.73_m2} (ref >=60)
GFR, Est Non African American: 65 mL/min/{1.73_m2} (ref >=60)
Globulin: 2.2 g/dL (calc) (ref 1.9–3.7)
Glucose, Bld: 82 mg/dL (ref 65–99)
Potassium: 3.9 mmol/L (ref 3.5–5.3)
Sodium: 141 mmol/L (ref 135–146)
Total Bilirubin: 0.4 mg/dL (ref 0.2–1.2)
Total Protein: 6.7 g/dL (ref 6.1–8.1)

## 2020-05-22 MED ORDER — INSULIN LISPRO 100 UNIT/ML ~~LOC~~ SOLN
0.0000 [IU] | Freq: Three times a day (TID) | SUBCUTANEOUS | 2 refills | Status: DC
Start: 2020-05-22 — End: 2021-08-08

## 2020-05-22 MED ORDER — INSULIN GLARGINE 100 UNIT/ML ~~LOC~~ SOLN: 50.0000 [IU] | Freq: Two times a day (BID) | SUBCUTANEOUS | 3 refills | Status: DC

## 2020-05-24 DIAGNOSIS — Z23 Encounter for immunization: Secondary | ICD-10-CM | POA: Diagnosis not present

## 2020-06-26 ENCOUNTER — Encounter: Payer: Self-pay | Admitting: Family Medicine

## 2020-06-27 ENCOUNTER — Other Ambulatory Visit: Payer: Self-pay | Admitting: Family Medicine

## 2020-06-27 MED ORDER — INSULIN PEN NEEDLE 31G X 8 MM MISC: 3 refills | Status: DC

## 2020-06-27 MED ORDER — SILDENAFIL CITRATE 100 MG PO TABS
50.0000 mg | ORAL_TABLET | Freq: Every day | ORAL | 11 refills | Status: DC | PRN
Start: 2020-06-27 — End: 2021-04-08

## 2020-06-27 NOTE — Telephone Encounter (Signed)
Pen needles sent to pharmacy.   Please advise on Viagra.

## 2020-07-10 ENCOUNTER — Encounter: Payer: Self-pay | Admitting: Family Medicine

## 2020-07-11 MED ORDER — BD INSULIN SYRINGE U-100 1 ML MISC: 1 refills | Status: DC

## 2020-08-02 DIAGNOSIS — H25013 Cortical age-related cataract, bilateral: Secondary | ICD-10-CM | POA: Diagnosis not present

## 2020-08-02 DIAGNOSIS — E119 Type 2 diabetes mellitus without complications: Secondary | ICD-10-CM | POA: Diagnosis not present

## 2020-08-02 DIAGNOSIS — H2513 Age-related nuclear cataract, bilateral: Secondary | ICD-10-CM | POA: Diagnosis not present

## 2020-08-02 DIAGNOSIS — H35361 Drusen (degenerative) of macula, right eye: Secondary | ICD-10-CM | POA: Diagnosis not present

## 2020-08-02 LAB — HM DIABETES EYE EXAM

## 2020-10-21 ENCOUNTER — Ambulatory Visit: Payer: Medicare Other | Admitting: Family Medicine

## 2020-10-28 ENCOUNTER — Ambulatory Visit: Payer: Medicare Other | Admitting: Family Medicine

## 2020-11-25 ENCOUNTER — Encounter: Payer: Self-pay | Admitting: Family Medicine

## 2020-11-25 ENCOUNTER — Other Ambulatory Visit: Payer: Self-pay

## 2020-11-25 ENCOUNTER — Ambulatory Visit (INDEPENDENT_AMBULATORY_CARE_PROVIDER_SITE_OTHER): Payer: Medicare Other | Admitting: Family Medicine

## 2020-11-25 VITALS — BP 136/68 | HR 58 | Temp 97.9°F | Resp 14 | Ht 73.0 in | Wt 253.0 lb

## 2020-11-25 DIAGNOSIS — Z794 Long term (current) use of insulin: Secondary | ICD-10-CM | POA: Diagnosis not present

## 2020-11-25 DIAGNOSIS — E785 Hyperlipidemia, unspecified: Secondary | ICD-10-CM | POA: Diagnosis not present

## 2020-11-25 DIAGNOSIS — E119 Type 2 diabetes mellitus without complications: Secondary | ICD-10-CM | POA: Diagnosis not present

## 2020-11-25 DIAGNOSIS — I10 Essential (primary) hypertension: Secondary | ICD-10-CM

## 2020-11-25 DIAGNOSIS — Z125 Encounter for screening for malignant neoplasm of prostate: Secondary | ICD-10-CM

## 2020-11-25 DIAGNOSIS — I480 Paroxysmal atrial fibrillation: Secondary | ICD-10-CM | POA: Diagnosis not present

## 2020-11-25 NOTE — Progress Notes (Signed)
Subjective:    Patient ID: Ronald Dorsey, male    DOB: March 03, 1951, 70 y.o.   MRN: [id number] Patient had gastric bypass in 5/21.  At  That time he was 312 lbs. he has maintained his weight loss and is 253 pounds today.  He questions if he can stop his Eliquis.  He states that he has not felt that he was in atrial fibrillation for over a year and a half.  He does not believe that he is going out of rhythm.  He denies any palpitations or shortness of breath or chest pain or dyspnea on exertion.  He continues to take Lantus 50 units twice a day and sliding scale insulin based on carb counting and his sugar.  He denies any hypoglycemic episodes.  He denies any lightheadedness or dizziness.  He denies any abdominal pain nausea vomiting or diarrhea.  He denies any myalgias or right upper quadrant pain.  Past Medical History:  Diagnosis Date  . Apnea   . Arthritis   . Diabetes mellitus without complication ([place])   . Hypercholesterolemia   . Hypertension   . Neuromuscular disorder (HCC)    sciatica  . Paroxysmal atrial fibrillation (HCC)   . PSVT (paroxysmal supraventricular tachycardia) ([place])    Past Surgical History:  Procedure Laterality Date  . CARPAL TUNNEL RELEASE  2010   left wrist  . EYE SURGERY    . LEFT HEART CATH AND CORONARY ANGIOGRAPHY N/A 09/23/2018   Procedure: LEFT HEART CATH AND CORONARY ANGIOGRAPHY;  Surgeon: Burnell Blanks, MD;  Location: [facility];  Service: Cardiovascular;  Laterality: N/A;  . TONSILLECTOMY  1961   Current Outpatient Medications on File Prior to Visit  Medication Sig Dispense Refill  . amLODipine (NORVASC) 10 MG tablet Take 1 tablet (10 mg total) by mouth daily. 90 tablet 3  . apixaban (ELIQUIS) 5 MG TABS tablet Take 1 tablet (5 mg total) by mouth 2 (two) times daily. 90 tablet 5  . Continuous Blood Gluc Receiver (FREESTYLE LIBRE 14 DAY READER) DEVI Use as directed to monitor blood glucose continuously. 1 each 1  . Continuous Blood  Gluc Sensor (FREESTYLE LIBRE 14 DAY SENSOR) MISC Use as directed to monitor blood glucose continuously. Replace sensor Q14 days. 2 each 11  . empagliflozin (JARDIANCE) 25 MG TABS tablet TAKE 1 TABLET BY MOUTH EVERY MORNING BEFORE BREAKFAST 90 tablet 3  . ezetimibe (ZETIA) 10 MG tablet TAKE 1 TABLET(10 MG) BY MOUTH DAILY 90 tablet 3  . hydrochlorothiazide (HYDRODIURIL) 25 MG tablet Take 1 tablet (25 mg total) by mouth daily. 90 tablet 3  . insulin glargine (LANTUS) 100 UNIT/ML injection Inject 0.5 mLs (50 Units total) into the skin 2 (two) times daily with a meal. 90 mL 3  . insulin lispro (HUMALOG) 100 UNIT/ML injection Inject 0-0.3 mLs (0-30 Units total) into the skin 3 (three) times daily with meals. Per sliding scale. MAX DAILY DOSE 90U 110 mL 2  . Insulin Syringes, Disposable, (B-D INSULIN SYRINGE 1CC) U-100 1 ML MISC Use as directed to inject insulin 5x daily. Dx:E11.65. 500 each 1  . lisinopril (ZESTRIL) 20 MG tablet TAKE 2 TABLETS BY MOUTH EVERY DAY 180 tablet 0  . metFORMIN (GLUCOPHAGE) 500 MG tablet Take 2 tablets (1,000 mg total) by mouth 2 (two) times daily. 360 tablet 2  . metoprolol succinate (TOPROL-XL) 25 MG 24 hr tablet TAKE 1 TABLET BY MOUTH DAILY 90 tablet 3  . omega-3 acid ethyl esters (LOVAZA) 1 g capsule  TAKE 2 CAPSULES BY MOUTH TWICE A DAY (Patient not taking: Reported on 12/28/2019) 360 capsule 0  . pioglitazone (ACTOS) 30 MG tablet Take 1 tablet (30 mg total) by mouth daily before breakfast. 90 tablet 3  . rosuvastatin (CRESTOR) 20 MG tablet Take 1 tablet (20 mg total) by mouth daily. 90 tablet 3  . sildenafil (VIAGRA) 100 MG tablet Take 0.5-1 tablets (50-100 mg total) by mouth daily as needed for erectile dysfunction. Please dispense generic 5 tablet 11   No current facility-administered medications on file prior to visit.   Allergies  Allergen Reactions  . Eggs Or Egg-Derived Products Nausea And Vomiting   Social History   Socioeconomic History  . Marital status:  Married    Spouse name: Not on file  . Number of children: Not on file  . Years of education: Not on file  . Highest education level: Not on file  Occupational History  . Not on file  Tobacco Use  . Smoking status: Former Research scientist (life sciences)  . Smokeless tobacco: Former Network engineer and Sexual Activity  . Alcohol use: Yes    Comment: 1 glass of wine a month or less  . Drug use: No  . 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 Not on file  Social History Narrative  . Not on file   Social Determinants of Health   Financial Resource Strain: Not on file  Food Insecurity: Not on file  Transportation Needs: Not on file  Physical Activity: Not on file  Stress: Not on file  Social Connections: Not on file  Intimate Partner Violence: Not on file   Family History  Problem Relation Age of Onset  . Hypertension Father       Review of Systems  All other systems reviewed and are negative.      Objective:   Physical Exam Vitals reviewed.  Constitutional:      General: He is not in acute distress.    Appearance: He is well-developed. He is not diaphoretic.  HENT:     Head: Normocephalic and atraumatic.     Right Ear: External ear normal.     Left Ear: External ear normal.     Nose: Nose normal.     Mouth/Throat:     Pharynx: No oropharyngeal exudate.  Eyes:     General: No scleral icterus.       Right eye: No discharge.        Left eye: No discharge.     Conjunctiva/sclera: Conjunctivae normal.     Pupils: Pupils are equal, round, and reactive to light.  Neck:     Thyroid: No thyromegaly.     Vascular: No JVD.     Trachea: No tracheal deviation.  Cardiovascular:     Rate and Rhythm: Normal rate. Rhythm irregular.     Heart sounds: Normal heart sounds. No murmur heard. No friction rub. No gallop.   Pulmonary:     Effort: Pulmonary effort is normal. No respiratory distress.     Breath sounds: Normal breath sounds. No  stridor. No wheezing or rales.  Chest:     Chest wall: No tenderness.  Abdominal:     General: Bowel sounds are normal. There is no distension.     Palpations: Abdomen is soft. There is no mass.     Tenderness: There is no abdominal tenderness. There is no guarding or rebound.  Musculoskeletal:  General: No tenderness or deformity. Normal range of motion.     Cervical back: Normal range of motion and neck supple.  Lymphadenopathy:     Cervical: No cervical adenopathy.  Skin:    General: Skin is warm.     Coloration: Skin is not pale.     Findings: No erythema or rash.  Neurological:     Mental Status: He is alert and oriented to person, place, and time.     Cranial Nerves: No cranial nerve deficit.     Motor: No abnormal muscle tone.     Coordination: Coordination normal.     Deep Tendon Reflexes: Reflexes are normal and symmetric.  Psychiatric:        Behavior: Behavior normal.        Thought Content: Thought content normal.        Judgment: Judgment normal.           Assessment & Plan:  Diabetes mellitus, type II, insulin dependent (HCC) - Plan: Hemoglobin A1c, CBC with Differential/Platelet, COMPLETE METABOLIC PANEL WITH GFR, Lipid panel, Microalbumin, urine  Hyperlipidemia, unspecified hyperlipidemia type - Plan: Hemoglobin A1c, CBC with Differential/Platelet, COMPLETE METABOLIC PANEL WITH GFR, Lipid panel, Microalbumin, urine  Paroxysmal atrial fibrillation (HCC) - Plan: EKG 12-Lead  Essential hypertension - Plan: Hemoglobin A1c, CBC with Differential/Platelet, COMPLETE METABOLIC PANEL WITH GFR, Lipid panel, Microalbumin, urine  Prostate cancer screening - Plan: PSA  I will check an A1c today.  Goal A1c is less than 7.  Check a CMP to monitor liver and kidney function.  Check a urine microalbumin.  Check a LDL cholesterol and his goal LDL cholesterol be less than 100.  Check a PSA to screen for prostate cancer.  Blood pressure today is acceptable.  However on  his exam, he has a slightly irregular heart rate.  Therefore I will obtain an EKG today to evaluate further.  Thankfully, EKG today shows normal sinus rhythm.  He does have occasional PACs as well as sinus arrhythmia.  This must be the irregular heart rate that I detected on his exam however there is no evidence of atrial fibrillation.  I recommended that he continue Eliquis unless we have a prolonged cardiac monitor that shows no evidence of paroxysmal atrial fibrillation.  He will consider this.  If he changes his mind, I will schedule him to see a cardiologist for a monitor.

## 2020-11-26 LAB — CBC WITH DIFFERENTIAL/PLATELET
Absolute Monocytes: 784 cells/uL (ref 200–950)
Basophils Absolute: 96 cells/uL (ref 0–200)
Basophils Relative: 1.3 %
Eosinophils Absolute: 252 cells/uL (ref 15–500)
Eosinophils Relative: 3.4 %
HCT: 50.1 % — ABNORMAL HIGH (ref 38.5–50.0)
Hemoglobin: 16.5 g/dL (ref 13.2–17.1)
Lymphs Abs: 1991 cells/uL (ref 850–3900)
MCH: 29.3 pg (ref 27.0–33.0)
MCHC: 32.9 g/dL (ref 32.0–36.0)
MCV: 88.8 fL (ref 80.0–100.0)
MPV: 11.8 fL (ref 7.5–12.5)
Monocytes Relative: 10.6 %
Neutro Abs: 4277 cells/uL (ref 1500–7800)
Neutrophils Relative %: 57.8 %
Platelets: 155 10*3/uL (ref 140–400)
RBC: 5.64 10*6/uL (ref 4.20–5.80)
RDW: 14 % (ref 11.0–15.0)
Total Lymphocyte: 26.9 %
WBC: 7.4 10*3/uL (ref 3.8–10.8)

## 2020-11-26 LAB — COMPLETE METABOLIC PANEL WITH GFR
AG Ratio: 2.2 (calc) (ref 1.0–2.5)
ALT: 18 U/L (ref 9–46)
AST: 22 U/L (ref 10–35)
Albumin: 4.6 g/dL (ref 3.6–5.1)
Alkaline phosphatase (APISO): 59 U/L (ref 35–144)
BUN: 22 mg/dL (ref 7–25)
CO2: 30 mmol/L (ref 20–32)
Calcium: 10.1 mg/dL (ref 8.6–10.3)
Chloride: 100 mmol/L (ref 98–110)
Creat: 1 mg/dL (ref 0.70–1.25)
GFR, Est African American: 89 mL/min/{1.73_m2} (ref >=60)
GFR, Est Non African American: 76 mL/min/{1.73_m2} (ref >=60)
Globulin: 2.1 g/dL (calc) (ref 1.9–3.7)
Glucose, Bld: 133 mg/dL — ABNORMAL HIGH (ref 65–99)
Potassium: 4 mmol/L (ref 3.5–5.3)
Sodium: 139 mmol/L (ref 135–146)
Total Bilirubin: 0.7 mg/dL (ref 0.2–1.2)
Total Protein: 6.7 g/dL (ref 6.1–8.1)

## 2020-11-26 LAB — MICROALBUMIN, URINE: Microalb, Ur: 1.6 mg/dL

## 2020-11-26 LAB — LIPID PANEL
Cholesterol: 109 mg/dL (ref <200)
HDL: 43 mg/dL (ref >=40)
LDL Cholesterol (Calc): 46 mg/dL (calc)
Non-HDL Cholesterol (Calc): 66 mg/dL (calc) (ref <130)
Total CHOL/HDL Ratio: 2.5 (calc) (ref <5.0)
Triglycerides: 122 mg/dL (ref <150)

## 2020-11-26 LAB — HEMOGLOBIN A1C
Hgb A1c MFr Bld: 7.9 % of total Hgb — ABNORMAL HIGH (ref <5.7)
Mean Plasma Glucose: 180 mg/dL
eAG (mmol/L): 10 mmol/L

## 2020-11-26 LAB — PSA: PSA: 1.8 ng/mL (ref <=4.0)

## 2021-01-15 ENCOUNTER — Telehealth: Payer: Self-pay | Admitting: Family Medicine

## 2021-01-15 NOTE — Telephone Encounter (Signed)
Left message for patient to call back and schedule Medicare Annual Wellness Visit (AWV) in office.   If not able to come in office, please offer to do virtually or by telephone.   Last AWV:  04/05/2017 Please schedule at anytime with [facility]-Nurse Health Advisor.  If any questions, please contact me at [phone]

## 2021-02-03 ENCOUNTER — Other Ambulatory Visit: Payer: Self-pay | Admitting: Family Medicine

## 2021-02-03 ENCOUNTER — Telehealth: Payer: Self-pay | Admitting: Family Medicine

## 2021-02-03 NOTE — Chronic Care Management (AMB) (Signed)
  Chronic Care Management   Note  02/03/2021 Name: Ronald Dorsey MRN: [id number] DOB: 09-17-1950  Ronald Dorsey is a 70 y.o. year old male who is a primary care patient of Dennard Schaumann, Cammie Mcgee, MD. I reached out to Patsy Lager by phone today in response to a referral sent by Ronald Dorsey's PCP, [name], MD.   [name] was given information about Chronic Care Management services today including:  CCM service includes personalized support from designated clinical staff supervised by his physician, including individualized plan of care and coordination with other care providers 24/7 contact phone numbers for assistance for urgent and routine care needs. Service will only be billed when office clinical staff spend 20 minutes or more in a month to coordinate care. Only one practitioner may furnish and bill the service in a calendar month. The patient may stop CCM services at any time (effective at the end of the month) by phone call to the office staff.   Patient agreed to services and verbal consent obtained.   Follow up plan:   Tatjana Secretary/administrator

## 2021-02-04 MED ORDER — OMEGA-3-ACID ETHYL ESTERS 1 G PO CAPS: 2.0000 | ORAL_CAPSULE | Freq: Two times a day (BID) | ORAL | 0 refills | Status: DC

## 2021-02-04 MED ORDER — LISINOPRIL 20 MG PO TABS: 2.0000 | ORAL_TABLET | Freq: Every day | ORAL | 0 refills | Status: DC

## 2021-02-05 ENCOUNTER — Telehealth: Payer: Self-pay | Admitting: *Deleted

## 2021-02-05 ENCOUNTER — Other Ambulatory Visit: Payer: Self-pay | Admitting: Family Medicine

## 2021-02-05 DIAGNOSIS — G709 Myoneural disorder, unspecified: Secondary | ICD-10-CM

## 2021-02-05 MED ORDER — INSULIN GLARGINE 100 UNIT/ML ~~LOC~~ SOLN: 50.0000 [IU] | Freq: Two times a day (BID) | SUBCUTANEOUS | 3 refills | Status: DC

## 2021-02-05 NOTE — Telephone Encounter (Signed)
Received fax requesting refill on Lantus.   Prescription sent to pharmacy.

## 2021-02-05 NOTE — Telephone Encounter (Signed)
Ok to refill?  Medication is not on current list.

## 2021-02-11 DIAGNOSIS — Z23 Encounter for immunization: Secondary | ICD-10-CM | POA: Diagnosis not present

## 2021-03-02 ENCOUNTER — Encounter: Payer: Self-pay | Admitting: Family Medicine

## 2021-03-25 ENCOUNTER — Telehealth: Payer: Self-pay | Admitting: Pharmacist

## 2021-03-25 NOTE — Progress Notes (Addendum)
Chronic Care Management Pharmacy Assistant   Name: Ronald Dorsey  MRN: [id number] DOB: 1951/07/10  Ronald Dorsey is an 70 y.o. year old male who presents for his initial CCM visit with the clinical pharmacist.  Reason for Encounter: Chart Prep    Conditions to be addressed/monitored: HTN, Afib, DM, HLD.  Primary concerns for visit include: HTN, DM  Recent office visits:  11/25/20 Dr. Dennard Schaumann For follow-up. No medication changes.   Recent consult visits:  None in the last six months  Hospital visits:  None in previous 6 months  Medication History: Lisinopril 20 mg 90 DS 02/04/21 Empagliflozin 25 mg 90 DS 03/02/21. Metformin 500 mg 90 DS 03/02/21 Pioglitazone 30 mg 90 DS 12/29/20 Rosuvastatin 20 mg 90 DS 12/29/20  Medications: Outpatient Encounter Medications as of 03/25/2021  Medication Sig   amLODipine (NORVASC) 10 MG tablet Take 1 tablet (10 mg total) by mouth daily.   apixaban (ELIQUIS) 5 MG TABS tablet Take 1 tablet (5 mg total) by mouth 2 (two) times daily.   empagliflozin (JARDIANCE) 25 MG TABS tablet TAKE 1 TABLET BY MOUTH EVERY MORNING BEFORE BREAKFAST   ezetimibe (ZETIA) 10 MG tablet TAKE 1 TABLET(10 MG) BY MOUTH DAILY   famotidine-calcium carbonate-magnesium hydroxide (PEPCID COMPLETE) 10-800-165 MG chewable tablet Chew 1 tablet by mouth 2 (two) times daily.   hydrochlorothiazide (HYDRODIURIL) 25 MG tablet Take 1 tablet (25 mg total) by mouth daily.   insulin glargine (LANTUS) 100 UNIT/ML injection Inject 0.5 mLs (50 Units total) into the skin 2 (two) times daily with a meal.   insulin lispro (HUMALOG) 100 UNIT/ML injection Inject 0-0.3 mLs (0-30 Units total) into the skin 3 (three) times daily with meals. Per sliding scale. MAX DAILY DOSE 90U   Insulin Syringes, Disposable, (B-D INSULIN SYRINGE 1CC) U-100 1 ML MISC Use as directed to inject insulin 5x daily. Dx:E11.65.   lisinopril (ZESTRIL) 20 MG tablet Take 2 tablets (40 mg total) by mouth daily.   metFORMIN  (GLUCOPHAGE) 500 MG tablet Take 2 tablets (1,000 mg total) by mouth 2 (two) times daily.   metoprolol succinate (TOPROL-XL) 25 MG 24 hr tablet TAKE 1 TABLET BY MOUTH DAILY   omega-3 acid ethyl esters (LOVAZA) 1 g capsule Take 2 capsules (2 g total) by mouth 2 (two) times daily.   pioglitazone (ACTOS) 30 MG tablet Take 1 tablet (30 mg total) by mouth daily before breakfast.   pregabalin (LYRICA) 150 MG capsule TAKE 1 CAPSULE BY MOUTH TWICE A DAY   rosuvastatin (CRESTOR) 20 MG tablet Take 1 tablet (20 mg total) by mouth daily.   sildenafil (VIAGRA) 100 MG tablet Take 0.5-1 tablets (50-100 mg total) by mouth daily as needed for erectile dysfunction. Please dispense generic   No facility-administered encounter medications on file as of 03/25/2021.    Have you seen any other providers since your last visit? Patient stated no.  Any changes in your medications or health? Patient stated no.  Any side effects from any medications? Patient stated no.  Do you have an symptoms or problems not managed by your medications? Patient stated no.  Any concerns about your health right now? Patient stated no.  Has your provider asked that you check blood pressure, blood sugar, or follow special diet at home? Patient stated he follows his diet, checks his blood sugar and sometimes he monitors his blood pressure.   Do you get any type of exercise on a regular basis? 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  Can you think of a goal you would like to reach for your health? Patient stated to continue living.   Do you have any problems getting your medications? Patient stated no.   Is there anything that you would like to discuss during the appointment? Patient would like to know if he is able change to a different way of checking his blood sugar.   Please bring medications and supplements to appointment, patient reminded of his face to face appointment on 03/27/21 at 9 am.  [place], [place] Pharmacist Assistant [phone]

## 2021-03-26 NOTE — Progress Notes (Signed)
Chronic Care Management Pharmacy Note  03/27/2021 Name:  Ronald Dorsey MRN:  [id number] DOB:  [birth year]  Summary: Initial PharmD visit for CCM.  Meds reviewed and updated.  Patient is interested in CGM to help better monitor his glucose.  Recommendations/Changes made from today's visit: Look into CGM options  Plan: FU 4 months PharmD  2 months glucose check   Subjective: Ronald Dorsey is an 70 y.o. year old male who is a primary patient of Pickard, Cammie Mcgee, MD.  The CCM team was consulted for assistance with disease management and care coordination needs.    Engaged with patient face to face for initial visit in response to provider referral for pharmacy case management and/or care coordination services.   Consent to Services:  The patient was given the following information about Chronic Care Management services today, agreed to services, and gave verbal consent: 1. CCM service includes personalized support from designated clinical staff supervised by the primary care provider, including individualized plan of care and coordination with other care providers 2. 24/7 contact phone numbers for assistance for urgent and routine care needs. 3. Service will only be billed when office clinical staff spend 20 minutes or more in a month to coordinate care. 4. Only one practitioner may furnish and bill the service in a calendar month. 5.The patient may stop CCM services at any time (effective at the end of the month) by phone call to the office staff. 6. The patient will be responsible for cost sharing (co-pay) of up to 20% of the service fee (after annual deductible is met). Patient agreed to services and consent obtained.  Patient Care Team: [name], MD as PCP - General (Family Medicine) Debara Pickett Nadean Corwin, MD as PCP - Cardiology (Cardiology) Edythe Clarity, [facility] as Pharmacist (Pharmacist)  Recent office visits:  11/25/20 Dr. Dennard Schaumann For follow-up. No medication changes.     Recent consult visits:  None in the last six months   Hospital visits:  None in previous 6 months   Medication History: Lisinopril 20 mg 90 DS 02/04/21 Empagliflozin 25 mg 90 DS 03/02/21. Metformin 500 mg 90 DS 03/02/21 Pioglitazone 30 mg 90 DS 12/29/20 Rosuvastatin 20 mg 90 DS 12/29/20   Objective:  Lab Results  Component Value Date   CREATININE 1.00 11/25/2020   BUN 22 11/25/2020   GFRNONAA 76 11/25/2020   GFRAA 89 11/25/2020   NA 139 11/25/2020   K 4.0 11/25/2020   CALCIUM 10.1 11/25/2020   CO2 30 11/25/2020   GLUCOSE 133 (H) 11/25/2020    Lab Results  Component Value Date/Time   HGBA1C 7.9 (H) 11/25/2020 08:26 AM   HGBA1C 8.1 (H) 05/20/2020 08:20 AM   MICROALBUR 1.6 11/25/2020 08:26 AM   MICROALBUR 1.4 05/20/2020 08:20 AM    Last diabetic Eye exam:  Lab Results  Component Value Date/Time   HMDIABEYEEXA No Retinopathy 08/02/2020 08:30 AM    Last diabetic Foot exam: No results found for: HMDIABFOOTEX   Lab Results  Component Value Date   CHOL 109 11/25/2020   HDL 43 11/25/2020   LDLCALC 46 11/25/2020   TRIG 122 11/25/2020   CHOLHDL 2.5 11/25/2020    Hepatic Function Latest Ref Rng & Units 11/25/2020 05/20/2020 12/28/2019  Total Protein 6.1 - 8.1 g/dL 6.7 6.7 6.9  Albumin 3.5 - 5.0 g/dL - - -  AST 10 - 35 U/L _0 ALT 9 - 46 U/L _1 Alk Phosphatase 38 - 126 U/L - - -  Total Bilirubin 0.2 - 1.2 mg/dL 0.7 0.4 0.7    Lab Results  Component Value Date/Time   TSH 1.227 09/23/2018 05:24 PM    CBC Latest Ref Rng & Units 11/25/2020 12/28/2019 11/27/2019  WBC 3.8 - 10.8 Thousand/uL 7.4 8.4 9.9  Hemoglobin 13.2 - 17.1 g/dL 16.5 15.4 15.3  Hematocrit 38.5 - 50.0 % 50.1(H) 46.9 46.8  Platelets 140 - 400 Thousand/uL 155 286 162    No results found for: VD25OH  Clinical ASCVD: Yes  The ASCVD Risk score Mikey Bussing DC Jr., et al., 2013) failed to calculate for the following reasons:   The valid total cholesterol range is 130 to 320 mg/dL    Depression screen [facility]  2/9 11/25/2020 11/27/2019 06/16/2018  Decreased Interest 0 0 0  Down, Depressed, Hopeless 0 0 0  PHQ - 2 Score 0 0 0  Altered sleeping - - -  Tired, decreased energy - - -  Change in appetite - - -  Feeling bad or failure about yourself  - - -  Trouble concentrating - - -  Moving slowly or fidgety/restless - - -  Suicidal thoughts - - -  PHQ-9 Score - - -  Difficult doing work/chores - - -     Social History   Tobacco Use  Smoking Status Former  Smokeless Tobacco Former   BP Readings from Last 3 Encounters:  11/25/20 136/68  05/20/20 (!) 110/58  12/28/19 (!) 100/54   Pulse Readings from Last 3 Encounters:  11/25/20 (!) 58  05/20/20 69  12/28/19 80   Wt Readings from Last 3 Encounters:  11/25/20 253 lb (114.8 kg)  05/20/20 246 lb (111.6 kg)  12/28/19 266 lb (120.7 kg)   BMI Readings from Last 3 Encounters:  11/25/20 33.38 kg/m  05/20/20 32.46 kg/m  12/28/19 35.09 kg/m    Assessment/Interventions: Review of patient past medical history, allergies, medications, health status, including review of consultants reports, laboratory and other test data, was performed as part of comprehensive evaluation and provision of chronic care management services.   SDOH:  (Social Determinants of Health) assessments and interventions performed: Yes  Financial Resource Strain: Low Risk    Difficulty of Paying Living Expenses: Not hard at all    SDOH Screenings   Alcohol Screen: Low Risk    Last Alcohol Screening Score (AUDIT): 0  Depression (PHQ2-9): Low Risk    PHQ-2 Score: 0  Financial Resource Strain: Low Risk    Difficulty of Paying Living Expenses: Not hard at all  Food Insecurity: Not on file  Housing: Not on file  Physical Activity: Not on file  Social Connections: Not on file  Stress: Not on file  Tobacco Use: Medium Risk   Smoking Tobacco Use: Former   Smokeless Tobacco Use: Former  Transport planner Needs: Not on file    [place]  Allergies  Allergen  Reactions   Eggs Or Egg-Derived Products Nausea And Vomiting    Medications Reviewed Today     Reviewed by Edythe Clarity, [place] (Pharmacist) on 03/27/21 at 0955  Med List Status: <None>   Medication Order Taking? Sig Documenting Provider Last Dose Status Informant  amLODipine (NORVASC) 10 MG tablet [id number] Yes Take 1 tablet (10 mg total) by mouth daily. [name], MD Taking Active   apixaban (ELIQUIS) 5 MG TABS tablet [id number] Yes Take 1 tablet (5 mg total) by mouth 2 (two) times daily. [name], MD Taking Active   empagliflozin (JARDIANCE) 25 MG TABS tablet [id number]  Yes TAKE 1 TABLET BY MOUTH EVERY MORNING BEFORE BREAKFAST [name], MD Taking Active   ezetimibe (ZETIA) 10 MG tablet [id number] Yes TAKE 1 TABLET(10 MG) BY MOUTH DAILY [name], MD Taking Active   famotidine-calcium carbonate-magnesium hydroxide (PEPCID COMPLETE) 10-800-165 MG chewable tablet [id number]  Chew 1 tablet by mouth 2 (two) times daily. [provider]  Active   hydrochlorothiazide (HYDRODIURIL) 25 MG tablet [id number] Yes Take 1 tablet (25 mg total) by mouth daily. [name], MD Taking Active   insulin glargine (LANTUS) 100 UNIT/ML injection [id number] Yes Inject 0.5 mLs (50 Units total) into the skin 2 (two) times daily with a meal. [name], MD Taking Active   insulin lispro (HUMALOG) 100 UNIT/ML injection [id number] Yes Inject 0-0.3 mLs (0-30 Units total) into the skin 3 (three) times daily with meals. Per sliding scale. MAX DAILY DOSE 90U [name], MD Taking Active   Insulin Syringes, Disposable, (B-D INSULIN SYRINGE 1CC) U-100 1 ML MISC [id number] Yes Use as directed to inject insulin 5x daily. Dx:E11.65. [name], MD Taking Active   lisinopril (ZESTRIL) 20 MG tablet [id number] Yes Take 2 tablets (40 mg total) by mouth daily. [name], MD Taking Active   metFORMIN (GLUCOPHAGE) 500 MG tablet [id number] Yes Take 2 tablets (1,000 mg  total) by mouth 2 (two) times daily. [name], MD Taking Active   metoprolol succinate (TOPROL-XL) 25 MG 24 hr tablet [id number] Yes TAKE 1 TABLET BY MOUTH DAILY [name], MD Taking Active   omega-3 acid ethyl esters (LOVAZA) 1 g capsule [id number] Yes Take 2 capsules (2 g total) by mouth 2 (two) times daily. [name], MD Taking Active   pioglitazone (ACTOS) 30 MG tablet [id number] Yes Take 1 tablet (30 mg total) by mouth daily before breakfast. [name], MD Taking Active   pregabalin (LYRICA) 150 MG capsule [id number] Yes TAKE 1 CAPSULE BY MOUTH TWICE A DAY [name], MD Taking Active   rosuvastatin (CRESTOR) 20 MG tablet [id number] Yes Take 1 tablet (20 mg total) by mouth daily. [name], MD Taking Active   sildenafil (VIAGRA) 100 MG tablet [id number]  Take 0.5-1 tablets (50-100 mg total) by mouth daily as needed for erectile dysfunction. Please dispense generic [name], MD  Active             Patient Active Problem List   Diagnosis Date Noted   Paroxysmal atrial fibrillation [facility])    Hypertension    Hypercholesterolemia    Diabetes mellitus without complication ([place])    Apnea    PSVT (paroxysmal supraventricular tachycardia) ([place])     Immunization History  Administered Date(s) Administered   Influenza Split 05/24/2013   Influenza, High Dose Seasonal PF 06/16/2018, 05/11/2019, 05/11/2019   Influenza,inj,Quad PF,6+ Mos 08/20/2015, 04/23/2016   PFIZER(Purple Top)SARS-COV-2 Vaccination 09/10/2019, 09/30/2019, 05/24/2020   Pneumococcal Conjugate-13 04/05/2017   Pneumococcal Polysaccharide-23 08/24/2008, 02/01/2015   Tdap 11/02/2006, 02/17/2017    Conditions to be addressed/monitored:  HTN, Afib, DM, HLD  Care Plan : General Pharmacy (Adult)  Updates made by Edythe Clarity, RPH since 03/27/2021 12:00 AM     Problem: HTN, Afib, DM, HLD   Priority: High  Onset Date: 03/27/2021     Long-Range Goal: Patient-Specific Goal    Start Date: 03/27/2021  Expected End Date: 09/27/2021  This Visit's Progress: On track  Priority: High  Note:   Current Barriers:  Unable to achieve control of glucose  Pharmacist Clinical Goal(s):  Patient will achieve control of glucose as evidenced by A1c adhere to plan to optimize therapeutic regimen for DM as evidenced by report of adherence to recommended medication management changes contact provider office for questions/concerns as evidenced notation of same in electronic health record through collaboration with PharmD and provider.   Interventions: 1:1 collaboration with [name], MD regarding development and update of comprehensive plan of care as evidenced by provider attestation and co-signature Inter-disciplinary care team collaboration (see longitudinal plan of care) Comprehensive medication review performed; medication list updated in electronic medical record  Hypertension  (Status:New goal.)   Med Management Intervention: home monitoring reviewed  (BP goal <130/80) -Controlled -Current treatment: Amlodipine 74m daily HCTZ 261mdaily Lisinopril 2067maily Toprol XL 52m27mily -Medications previously tried: none noted  -Current home readings: "normal" unless stressful day at work -Current dietary habits: does try to watch what he eats, [place] will eat his pastas and other foods sometimes he knows are not the best for sugar -Current exercise habits: minimal outside of work -Denies hypotensive/hypertensive symptoms -Educated on BP goals and benefits of medications for prevention of heart attack, stroke and kidney damage; Importance of home blood pressure monitoring; Symptoms of hypotension and importance of maintaining adequate hydration; -Counseled to monitor BP at home periodically, document, and provide log at future appointments -Recommended to continue current medication  Hyperlipidemia: (LDL goal < 70) -Controlled -Current  treatment: Rosuvastatin 20mg77mly Zetia 10mg 58my -Medications previously tried: none noted  -Current dietary patterns: see HTN -Current exercise habits: see HTn -Educated on Cholesterol goals;  Benefits of statin for ASCVD risk reduction; Importance of limiting foods high in cholesterol; -Most recent LDL is excellent -Recommended to continue current medication  Diabetes (A1c goal <7%) -Not ideally controlled -Current medications: Metformin 500mg b77mardiance 52mg da33mLantus 50 units bid Humalog 0-30 units tid with meals per sliding scale Pioglitazone 30mg dai76mMedications previously tried: Invokana  -Current home glucose readings fasting glucose: 150 this am - no further logs available post prandial glucose: varies, sometimes up to 250-260 if he forgets to takes his insulin until later after the meal -Denies hypoglycemic/hyperglycemic symptoms  -Educated on A1c and blood sugar goals; Proper insulin injection technique; Prevention and management of hypoglycemic episodes; Continuous glucose monitoring; Appropriate time for insulin dosing -Counseled to check feet daily and get yearly eye exams -Recommended to continue current medication Educated on CGM, patient very interested in some type of CGM and feels this would help better manage his glucose.  Will look into options for him.  Atrial Fibrillation (Goal: prevent stroke and major bleeding) -Controlled  -Current treatment: Rate control: Toprol XL 52mg  Ant52mgulation: Eliquis 5mg BID -M80mcations previously tried: none noted -Home BP and HR readings: "normal"  -Counseled on increased risk of stroke due to Afib and benefits of anticoagulation for stroke prevention; bleeding risk associated with Eliquis and importance of self-monitoring for signs/symptoms of bleeding; -Recommended to continue current medication  Patient Goals/Self-Care Activities Patient will:  - take medications as prescribed focus on  medication adherence by pill box check glucose daily, document, and provide at future appointments engage in dietary modifications by limiting carbs and sugars  Follow Up Plan: The care management team will reach out to the patient again over the next 120 days.        Medication Assistance: None required.  Patient affirms current coverage meets needs.  Compliance/Adherence/Medication fill history: Care Gaps: Foot exam  Star-Rating Drugs: Lisinopril 20 mg  90 DS 02/04/21 Empagliflozin 25 mg 90 DS 03/02/21. Metformin 500 mg 90 DS 03/02/21 Pioglitazone 30 mg 90 DS 12/29/20 Rosuvastatin 20 mg 90 DS 12/29/20  Patient's preferred pharmacy is:  [facility], [place] [address] Phone: [phone] Fax: [phone]  [facility], [place] [address] Phone: [phone] Fax: [phone]  [id number] [place] [address]Phone: [phone]: [phone] Uses pill box? Yes Pt endorses 100% compliance  We discussed: Benefits of medication synchronization, packaging and delivery as well as enhanced pharmacist oversight with [employer]. Patient decided to: Continue current medication management strategy  Care Plan and Follow Up Patient Decision:  Patient agrees to Care Plan and Follow-up.  Plan: The care management team will reach out to the patient again over the next 120 days.  CBeverly Milch PharmD Clinical Pharmacist [place]([phone]

## 2021-03-27 ENCOUNTER — Other Ambulatory Visit: Payer: Self-pay

## 2021-03-27 ENCOUNTER — Ambulatory Visit (INDEPENDENT_AMBULATORY_CARE_PROVIDER_SITE_OTHER): Payer: Medicare Other | Admitting: Pharmacist

## 2021-03-27 DIAGNOSIS — E785 Hyperlipidemia, unspecified: Secondary | ICD-10-CM | POA: Diagnosis not present

## 2021-03-27 DIAGNOSIS — I10 Essential (primary) hypertension: Secondary | ICD-10-CM

## 2021-03-27 DIAGNOSIS — I480 Paroxysmal atrial fibrillation: Secondary | ICD-10-CM

## 2021-03-27 DIAGNOSIS — E119 Type 2 diabetes mellitus without complications: Secondary | ICD-10-CM | POA: Diagnosis not present

## 2021-03-27 DIAGNOSIS — Z794 Long term (current) use of insulin: Secondary | ICD-10-CM | POA: Diagnosis not present

## 2021-03-27 NOTE — Patient Instructions (Addendum)
Visit Information Thank you for meeting with me today!  I look forward to working with you to help you meet all of your healthcare goals and answer any questions you may have.  Feel free to contact me anytime!   Goals Addressed             This Visit's Progress    Monitor and Manage My Blood Sugar-Diabetes Type 2       Timeframe:  Long-Range Goal Priority:  High Start Date:  03/27/21                           Expected End Date: 09/27/21                       Follow Up Date 07/23/21    - check blood sugar at prescribed times - check blood sugar if I feel it is too high or too low - enter blood sugar readings and medication or insulin into daily log - take the blood sugar log to all doctor visits    Why is this important?   Checking your blood sugar at home helps to keep it from getting very high or very low.  Writing the results in a diary or log helps the doctor know how to care for you.  Your blood sugar log should have the time, date and the results.  Also, write down the amount of insulin or other medicine that you take.  Other information, like what you ate, exercise done and how you were feeling, will also be helpful.     Notes:        Patient Care Plan: General Pharmacy (Adult)     Problem Identified: HTN, Afib, DM, HLD   Priority: High  Onset Date: 03/27/2021     Long-Range Goal: Patient-Specific Goal   Start Date: 03/27/2021  Expected End Date: 09/27/2021  This Visit's Progress: On track  Priority: High  Note:   Current Barriers:  Unable to achieve control of glucose   Pharmacist Clinical Goal(s):  Patient will achieve control of glucose as evidenced by A1c adhere to plan to optimize therapeutic regimen for DM as evidenced by report of adherence to recommended medication management changes contact provider office for questions/concerns as evidenced notation of same in electronic health record through collaboration with PharmD and provider.    Interventions: 1:1 collaboration with [name], MD regarding development and update of comprehensive plan of care as evidenced by provider attestation and co-signature Inter-disciplinary care team collaboration (see longitudinal plan of care) Comprehensive medication review performed; medication list updated in electronic medical record  Hypertension  (Status:New goal.)   Med Management Intervention: home monitoring reviewed  (BP goal <130/80) -Controlled -Current treatment: Amlodipine '10mg'$  daily HCTZ '25mg'$  daily Lisinopril '20mg'$  daily Toprol XL '25mg'$  daily -Medications previously tried: none noted  -Current home readings: "normal" unless stressful day at work -Current dietary habits: does try to watch what he eats, [place] so he will eat his pastas and other foods sometimes he knows are not the best for sugar -Current exercise habits: minimal outside of work -Denies hypotensive/hypertensive symptoms -Educated on BP goals and benefits of medications for prevention of heart attack, stroke and kidney damage; Importance of home blood pressure monitoring; Symptoms of hypotension and importance of maintaining adequate hydration; -Counseled to monitor BP at home periodically, document, and provide log at future appointments -Recommended to continue current medication  Hyperlipidemia: (LDL goal <  70) -Controlled -Current treatment: Rosuvastatin '20mg'$  daily Zetia '10mg'$  daily -Medications previously tried: none noted  -Current dietary patterns: see HTN -Current exercise habits: see HTn -Educated on Cholesterol goals;  Benefits of statin for ASCVD risk reduction; Importance of limiting foods high in cholesterol; -Most recent LDL is excellent -Recommended to continue current medication  Diabetes (A1c goal <7%) -Not ideally controlled -Current medications: Metformin '500mg'$  bid Jardiance '25mg'$  daily Lantus 50 units bid Humalog 0-30 units tid with meals per sliding  scale Pioglitazone '30mg'$  daily -Medications previously tried: Invokana  -Current home glucose readings fasting glucose: 150 this am - no further logs available post prandial glucose: varies, sometimes up to 250-260 if he forgets to takes his insulin until later after the meal -Denies hypoglycemic/hyperglycemic symptoms  -Educated on A1c and blood sugar goals; Proper insulin injection technique; Prevention and management of hypoglycemic episodes; Continuous glucose monitoring; Appropriate time for insulin dosing -Counseled to check feet daily and get yearly eye exams -Recommended to continue current medication Educated on CGM, patient very interested in some type of CGM and feels this would help better manage his glucose.  Will look into options for him.  Atrial Fibrillation (Goal: prevent stroke and major bleeding) -Controlled  -Current treatment: Rate control: Toprol XL '25mg'$   Anticoagulation: Eliquis '5mg'$  BID -Medications previously tried: none noted -Home BP and HR readings: "normal"  -Counseled on increased risk of stroke due to Afib and benefits of anticoagulation for stroke prevention; bleeding risk associated with Eliquis and importance of self-monitoring for signs/symptoms of bleeding; -Recommended to continue current medication  Patient Goals/Self-Care Activities Patient will:  - take medications as prescribed focus on medication adherence by pill box check glucose daily, document, and provide at future appointments engage in dietary modifications by limiting carbs and sugars  Follow Up Plan: The care management team will reach out to the patient again over the next 120 days.       Mr. Dilks was given information about Chronic Care Management services today including:  CCM service includes personalized support from designated clinical staff supervised by his physician, including individualized plan of care and coordination with other care providers 24/7 contact  phone numbers for assistance for urgent and routine care needs. Standard insurance, coinsurance, copays and deductibles apply for chronic care management only during months in which we provide at least 20 minutes of these services. Most insurances cover these services at 100%, however patients may be responsible for any copay, coinsurance and/or deductible if applicable. This service may help you avoid the need for more expensive face-to-face services. Only one practitioner may furnish and bill the service in a calendar month. The patient may stop CCM services at any time (effective at the end of the month) by phone call to the office staff.  Patient agreed to services and verbal consent obtained.   The patient verbalized understanding of instructions, educational materials, and care plan provided today and agreed to receive a mailed copy of patient instructions, educational materials, and care plan.  Telephone follow up appointment with pharmacy team member scheduled for: 4 months  Edythe Clarity, [place]

## 2021-03-31 ENCOUNTER — Telehealth: Payer: Self-pay | Admitting: Pharmacist

## 2021-03-31 NOTE — Progress Notes (Signed)
    Chronic Care Management Pharmacy Assistant   Name: Ronald Dorsey  MRN: [id number] DOB: [birth year]  Reason for Encounter: CCM Care Plan  Medications: Outpatient Encounter Medications as of 03/31/2021  Medication Sig   amLODipine (NORVASC) 10 MG tablet Take 1 tablet (10 mg total) by mouth daily.   apixaban (ELIQUIS) 5 MG TABS tablet Take 1 tablet (5 mg total) by mouth 2 (two) times daily.   empagliflozin (JARDIANCE) 25 MG TABS tablet TAKE 1 TABLET BY MOUTH EVERY MORNING BEFORE BREAKFAST   ezetimibe (ZETIA) 10 MG tablet TAKE 1 TABLET(10 MG) BY MOUTH DAILY   famotidine-calcium carbonate-magnesium hydroxide (PEPCID COMPLETE) 10-800-165 MG chewable tablet Chew 1 tablet by mouth 2 (two) times daily.   hydrochlorothiazide (HYDRODIURIL) 25 MG tablet Take 1 tablet (25 mg total) by mouth daily.   insulin glargine (LANTUS) 100 UNIT/ML injection Inject 0.5 mLs (50 Units total) into the skin 2 (two) times daily with a meal.   insulin lispro (HUMALOG) 100 UNIT/ML injection Inject 0-0.3 mLs (0-30 Units total) into the skin 3 (three) times daily with meals. Per sliding scale. MAX DAILY DOSE 90U   Insulin Syringes, Disposable, (B-D INSULIN SYRINGE 1CC) U-100 1 ML MISC Use as directed to inject insulin 5x daily. Dx:E11.65.   lisinopril (ZESTRIL) 20 MG tablet Take 2 tablets (40 mg total) by mouth daily.   metFORMIN (GLUCOPHAGE) 500 MG tablet Take 2 tablets (1,000 mg total) by mouth 2 (two) times daily.   metoprolol succinate (TOPROL-XL) 25 MG 24 hr tablet TAKE 1 TABLET BY MOUTH DAILY   omega-3 acid ethyl esters (LOVAZA) 1 g capsule Take 2 capsules (2 g total) by mouth 2 (two) times daily.   pioglitazone (ACTOS) 30 MG tablet Take 1 tablet (30 mg total) by mouth daily before breakfast.   pregabalin (LYRICA) 150 MG capsule TAKE 1 CAPSULE BY MOUTH TWICE A DAY   rosuvastatin (CRESTOR) 20 MG tablet Take 1 tablet (20 mg total) by mouth daily.   sildenafil (VIAGRA) 100 MG tablet Take 0.5-1 tablets (50-100 mg  total) by mouth daily as needed for erectile dysfunction. Please dispense generic   No facility-administered encounter medications on file as of 03/31/2021.   Reviewed the patients initial visit reinsured it was completed per the pharmacist Leata Mouse request. Printed the CCM care plan. Mailed the patient CCM care plan to their most recent address on file.  Follow-Up:Pharmacist Review  Charlann Lange, [place] Pharmacist Assistant [phone]

## 2021-04-06 ENCOUNTER — Encounter (HOSPITAL_COMMUNITY): Payer: Self-pay

## 2021-04-06 ENCOUNTER — Emergency Department (HOSPITAL_COMMUNITY): Payer: Medicare Other

## 2021-04-06 ENCOUNTER — Other Ambulatory Visit: Payer: Self-pay

## 2021-04-06 ENCOUNTER — Emergency Department (HOSPITAL_COMMUNITY)
Admission: EM | Admit: 2021-04-06 | Discharge: 2021-04-06 | Disposition: A | Discharge status: Home / Self Care | Payer: Medicare Other | Attending: Emergency Medicine | Admitting: Emergency Medicine

## 2021-04-06 DIAGNOSIS — R001 Bradycardia, unspecified: Secondary | ICD-10-CM | POA: Diagnosis not present

## 2021-04-06 DIAGNOSIS — Z79899 Other long term (current) drug therapy: Secondary | ICD-10-CM | POA: Diagnosis not present

## 2021-04-06 DIAGNOSIS — Z794 Long term (current) use of insulin: Secondary | ICD-10-CM | POA: Diagnosis not present

## 2021-04-06 DIAGNOSIS — E119 Type 2 diabetes mellitus without complications: Secondary | ICD-10-CM | POA: Diagnosis not present

## 2021-04-06 DIAGNOSIS — Z7901 Long term (current) use of anticoagulants: Secondary | ICD-10-CM | POA: Diagnosis not present

## 2021-04-06 DIAGNOSIS — R42 Dizziness and giddiness: Secondary | ICD-10-CM | POA: Diagnosis not present

## 2021-04-06 DIAGNOSIS — R55 Syncope and collapse: Secondary | ICD-10-CM | POA: Diagnosis not present

## 2021-04-06 DIAGNOSIS — R410 Disorientation, unspecified: Secondary | ICD-10-CM | POA: Diagnosis not present

## 2021-04-06 DIAGNOSIS — I4891 Unspecified atrial fibrillation: Secondary | ICD-10-CM | POA: Diagnosis not present

## 2021-04-06 DIAGNOSIS — Z87891 Personal history of nicotine dependence: Secondary | ICD-10-CM | POA: Insufficient documentation

## 2021-04-06 DIAGNOSIS — R0902 Hypoxemia: Secondary | ICD-10-CM | POA: Diagnosis not present

## 2021-04-06 DIAGNOSIS — Z7984 Long term (current) use of oral hypoglycemic drugs: Secondary | ICD-10-CM | POA: Diagnosis not present

## 2021-04-06 DIAGNOSIS — R404 Transient alteration of awareness: Secondary | ICD-10-CM | POA: Diagnosis not present

## 2021-04-06 DIAGNOSIS — I959 Hypotension, unspecified: Secondary | ICD-10-CM | POA: Diagnosis not present

## 2021-04-06 DIAGNOSIS — I10 Essential (primary) hypertension: Secondary | ICD-10-CM | POA: Diagnosis not present

## 2021-04-06 LAB — CBC
HCT: 43.8 % (ref 39.0–52.0)
Hemoglobin: 14.7 g/dL (ref 13.0–17.0)
MCH: 30.9 pg (ref 26.0–34.0)
MCHC: 33.6 g/dL (ref 30.0–36.0)
MCV: 92.2 fL (ref 80.0–100.0)
Platelets: 157 10*3/uL (ref 150–400)
RBC: 4.75 MIL/uL (ref 4.22–5.81)
RDW: 14.3 % (ref 11.5–15.5)
WBC: 12.6 10*3/uL — ABNORMAL HIGH (ref 4.0–10.5)
nRBC: 0 % (ref 0.0–0.2)

## 2021-04-06 LAB — BASIC METABOLIC PANEL WITH GFR
Anion gap: 8 (ref 5–15)
BUN: 26 mg/dL — ABNORMAL HIGH (ref 8–23)
CO2: 25 mmol/L (ref 22–32)
Calcium: 9.1 mg/dL (ref 8.9–10.3)
Chloride: 103 mmol/L (ref 98–111)
Creatinine, Ser: 0.97 mg/dL (ref 0.61–1.24)
GFR, Estimated: >60 mL/min
Glucose, Bld: 133 mg/dL — ABNORMAL HIGH (ref 70–99)
Potassium: 3.6 mmol/L (ref 3.5–5.1)
Sodium: 136 mmol/L (ref 135–145)

## 2021-04-06 LAB — TSH: TSH: 8.628 u[IU]/mL — ABNORMAL HIGH (ref 0.350–4.500)

## 2021-04-06 LAB — TROPONIN I (HIGH SENSITIVITY)
Troponin I (High Sensitivity): 7 ng/L
Troponin I (High Sensitivity): 8 ng/L

## 2021-04-06 LAB — T4, FREE: Free T4: 0.86 ng/dL (ref 0.61–1.12)

## 2021-04-06 MED ORDER — SODIUM CHLORIDE 0.9 % IV BOLUS
1000.0000 mL | Freq: Once | INTRAVENOUS | Status: AC
  Administered 2021-04-06: 1000 mL via INTRAVENOUS

## 2021-04-06 NOTE — ED Provider Notes (Signed)
[facility] EMERGENCY DEPARTMENT Provider Note   CSN: [id number] Arrival date & time: 04/06/21  1606     History Chief Complaint  Patient presents with   Bradycardia    Ronald Dorsey is a 70 y.o. male.  30-year-old male history of atrial fibrillation on Eliquis presents to the ER secondary to bradycardia.  Patient reports that prior to arrival he was about to eat lunch, took his Premeal insulin.  He felt as though his blood sugar was very low, he was feeling diaphoretic, presyncopal.  Nausea, no vomiting.  Felt very sleepy.  No seizure-like activity reported.  Patient was given atropine 1 mg by EMS.  Rate improved.  Blood glucose was stable upon arrival.  He was also given 500 mL of NS.  Patient ports he feels as though he is back to his baseline although his mouth is dry.  He has no headache, no neurologic findings, no numbness or tingling.  No abdominal pain, no fevers or chills.  Normal state of health prior to onset of the symptoms.  Reports his experiences in the past and felt as though it was because his blood sugar was low.  He does not follow with cardiology. no Chest pain.  No dyspnea.  No recent medication changes.  Reports good compliance with antihypertensives, no extra doses.  The history is provided by the patient. No language interpreter was used.      Past Medical History:  Diagnosis Date   Apnea    Arthritis    Diabetes mellitus without complication (HCC)    Hypercholesterolemia    Hypertension    Neuromuscular disorder (HCC)    sciatica   Paroxysmal atrial fibrillation (HCC)    PSVT (paroxysmal supraventricular tachycardia) [facility])     Patient Active Problem List   Diagnosis Date Noted   Paroxysmal atrial fibrillation ([place])    Hypertension    Hypercholesterolemia    Diabetes mellitus without complication ([place])    Apnea    PSVT (paroxysmal supraventricular tachycardia) ([place])     Past Surgical History:  Procedure Laterality Date   CARPAL  TUNNEL RELEASE  2010   left wrist   EYE SURGERY     LEFT HEART CATH AND CORONARY ANGIOGRAPHY N/A 09/23/2018   Procedure: LEFT HEART CATH AND CORONARY ANGIOGRAPHY;  Surgeon: Burnell Blanks, MD;  Location: [facility];  Service: Cardiovascular;  Laterality: N/A;   TONSILLECTOMY  1961       Family History  Problem Relation Age of Onset   Hypertension Father     Social History   Tobacco Use   Smoking status: Former   Smokeless tobacco: Former  Substance Use Topics   Alcohol use: Yes    Comment: 1 glass of wine a month or less   Drug use: No    Home Medications Prior to Admission medications   Medication Sig Start Date End Date Taking? Authorizing Provider  amLODipine (NORVASC) 10 MG tablet Take 1 tablet (10 mg total) by mouth daily. 05/20/20   [name], MD  apixaban (ELIQUIS) 5 MG TABS tablet Take 1 tablet (5 mg total) by mouth 2 (two) times daily. 05/20/20   [name], MD  empagliflozin (JARDIANCE) 25 MG TABS tablet TAKE 1 TABLET BY MOUTH EVERY MORNING BEFORE BREAKFAST 05/20/20   [name], MD  ezetimibe (ZETIA) 10 MG tablet TAKE 1 TABLET(10 MG) BY MOUTH DAILY 05/20/20   [name], MD  famotidine-calcium carbonate-magnesium hydroxide (PEPCID COMPLETE) 10-800-165 MG  chewable tablet Chew 1 tablet by mouth 2 (two) times daily. 12/13/19   [provider]  hydrochlorothiazide (HYDRODIURIL) 25 MG tablet Take 1 tablet (25 mg total) by mouth daily. 05/20/20   [name], MD  insulin glargine (LANTUS) 100 UNIT/ML injection Inject 0.5 mLs (50 Units total) into the skin 2 (two) times daily with a meal. 02/05/21   Pickard, Cammie Mcgee, MD  insulin lispro (HUMALOG) 100 UNIT/ML injection Inject 0-0.3 mLs (0-30 Units total) into the skin 3 (three) times daily with meals. Per sliding scale. MAX DAILY DOSE 90U 05/22/20   [name], MD  Insulin Syringes, Disposable, (B-D INSULIN SYRINGE 1CC) U-100 1 ML MISC Use as directed to inject insulin  5x daily. Dx:E11.65. 07/11/20   [name], MD  lisinopril (ZESTRIL) 20 MG tablet Take 2 tablets (40 mg total) by mouth daily. 02/04/21   [name], MD  metFORMIN (GLUCOPHAGE) 500 MG tablet Take 2 tablets (1,000 mg total) by mouth 2 (two) times daily. 05/20/20   [name], MD  metoprolol succinate (TOPROL-XL) 25 MG 24 hr tablet TAKE 1 TABLET BY MOUTH DAILY 05/20/20   [name], MD  omega-3 acid ethyl esters (LOVAZA) 1 g capsule Take 2 capsules (2 g total) by mouth 2 (two) times daily. 02/04/21   [name], MD  pioglitazone (ACTOS) 30 MG tablet Take 1 tablet (30 mg total) by mouth daily before breakfast. 05/20/20   [name], MD  pregabalin (LYRICA) 150 MG capsule TAKE 1 CAPSULE BY MOUTH TWICE A DAY 02/06/21   [name], MD  rosuvastatin (CRESTOR) 20 MG tablet Take 1 tablet (20 mg total) by mouth daily. 05/20/20   [name], MD  sildenafil (VIAGRA) 100 MG tablet Take 0.5-1 tablets (50-100 mg total) by mouth daily as needed for erectile dysfunction. Please dispense generic 06/27/20   [name], MD    Allergies    Eggs or egg-derived products  Review of Systems   Review of Systems  Constitutional:  Positive for diaphoresis. Negative for chills and fever.  HENT:  Negative for facial swelling and trouble swallowing.   Eyes:  Negative for photophobia and visual disturbance.  Respiratory:  Negative for cough and shortness of breath.   Cardiovascular:  Negative for chest pain and palpitations.  Gastrointestinal:  Positive for nausea. Negative for abdominal pain and vomiting.  Endocrine: Negative for polydipsia and polyuria.  Genitourinary:  Negative for difficulty urinating and hematuria.  Musculoskeletal:  Negative for gait problem and joint swelling.  Skin:  Negative for pallor and rash.  Neurological:  Positive for light-headedness. Negative for syncope and headaches.  Psychiatric/Behavioral:  Negative for agitation and confusion.     Physical Exam Updated Vital Signs BP (!) 114/48   Pulse (!) 52   Temp 97.6 F (36.4 C) (Oral)   Resp 16   Ht '6\' 1"'$  (1.854 m)   Wt 115.7 kg   SpO2 97%   BMI 33.64 kg/m   Physical Exam Vitals and nursing note reviewed.  Constitutional:      General: He is not in acute distress.    Appearance: He is well-developed.  HENT:     Head: Normocephalic and atraumatic.     Right Ear: External ear normal.     Left Ear: External ear normal.     Mouth/Throat:     Mouth: Mucous membranes are dry.  Eyes:     General: No scleral icterus.    Extraocular  Movements: Extraocular movements intact.     Pupils: Pupils are equal, round, and reactive to light.  Cardiovascular:     Rate and Rhythm: Bradycardia present. Rhythm irregular.     Pulses: Normal pulses.     Heart sounds: Normal heart sounds.  Pulmonary:     Effort: Pulmonary effort is normal. No respiratory distress.     Breath sounds: Normal breath sounds.  Abdominal:     General: Abdomen is flat.     Palpations: Abdomen is soft.     Tenderness: There is no abdominal tenderness.  Musculoskeletal:        General: Normal range of motion.     Cervical back: Normal range of motion.     Right lower leg: No edema.     Left lower leg: No edema.  Skin:    General: Skin is warm and dry.     Capillary Refill: Capillary refill takes less than 2 seconds.  Neurological:     Mental Status: He is alert and oriented to person, place, and time.     GCS: GCS eye subscore is 4. GCS verbal subscore is 5. GCS motor subscore is 6.     Cranial Nerves: Cranial nerves are intact.     Sensory: Sensation is intact.     Motor: Motor function is intact.     Coordination: Coordination is intact.  Psychiatric:        Mood and Affect: Mood normal.        Behavior: Behavior normal.    ED Results / Procedures / Treatments   Labs (all labs ordered are listed, but only abnormal results are displayed) Labs Reviewed  BASIC METABOLIC PANEL - Abnormal;  Notable for the following components:      Result Value   Glucose, Bld 133 (*)    BUN 26 (*)    All other components within normal limits  CBC - Abnormal; Notable for the following components:   WBC 12.6 (*)    All other components within normal limits  TSH - Abnormal; Notable for the following components:   TSH 8.628 (*)    All other components within normal limits  T4, FREE  TROPONIN I (HIGH SENSITIVITY)  TROPONIN I (HIGH SENSITIVITY)    EKG EKG Interpretation  Date/Time:  Sunday April 06 2021 16:13:50 EDT Ventricular Rate:  65 PR Interval:  191 QRS Duration: 103 QT Interval:  412 QTC Calculation: 429 R Axis:   55 Text Interpretation: Sinus rhythm Similar to previous tracing 08/2018 No STEMI Confirmed by Wynona Dove ([id number]) on 04/06/2021 5:45:05 PM  Radiology DG Chest Port 1 View  Result Date: 04/06/2021 CLINICAL DATA:  Bradycardia EXAM: PORTABLE CHEST 1 VIEW COMPARISON:  12/28/2019 FINDINGS: Cardiac shadow is prominent but accentuated by the portable technique. No focal infiltrate or effusion is seen. No bony abnormality is noted. IMPRESSION: No active disease. Electronically Signed   By: Inez Catalina M.D.   On: 04/06/2021 18:09    Procedures Procedures   Medications Ordered in ED Medications  sodium chloride 0.9 % bolus 1,000 mL (1,000 mLs Intravenous New Bag/Given 04/06/21 1724)    ED Course  I have reviewed the triage vital signs and the nursing notes.  Pertinent labs & imaging results that were available during my care of the patient were reviewed by me and considered in my medical decision making (see chart for details).    MDM Rules/Calculators/A&P  This is a 70 year old male with history as above presenting to the ER secondary to low heart rate, near syncope.  His physical exam is reassuring.  Neurologic exam is nonfocal.  Patient in no acute distress at this time.  Speaking clearly in full sentences.  Reports he feels that he is back  to his baseline.  Vital signs reviewed.  Serious etiology considered.  Continue telemetry monitoring.  Patient given IV fluids.  Has been stable for the entirety of his evaluation emergency department.  ECG without evidence of acute ischemia, normal sinus rhythm.  Patient with history of atrial fibrillation.  Anticoagulated on Eliquis.  Troponin is not elevated.  Chest x-ray is unremarkable.  He is currently asymptomatic.  No chest pain reported.  Labs reviewed and otherwise stable.  Patient is ambulatory.  Reports he feels he is back to his baseline.  Patient history atrial fibrillation, could be etiology of patient's intermittent low heart rate.  Discussed with patient and spouse at bedside, observation was offered however patient prefers to update his cardiologist in the office in the next 24 hours.  This is reasonable given resolution of symptoms and reassuring physical monitor evaluation. Advised to RTED if unable to see cardiology or if symptoms return, or any development or worrisome or worsening symptoms.  The patient improved significantly and was discharged in stable condition. Detailed discussions were had with the patient regarding current findings, and need for close f/u with PCP or on call doctor. The patient has been instructed to return immediately if the symptoms worsen in any way for re-evaluation. Patient verbalized understanding and is in agreement with current care plan. All questions answered prior to discharge.    Final Clinical Impression(s) / ED Diagnoses Final diagnoses:  Near syncope  Bradycardia  Atrial fibrillation, unspecified type [facility])    Rx / DC Orders ED Discharge Orders          Ordered    Ambulatory referral to Cardiology        04/06/21 2050             Jeanell Sparrow, DO 04/06/21 2055

## 2021-04-06 NOTE — ED Triage Notes (Signed)
Per EMS pt had a episode where he went gray, sweating, and decreased LOC. HR was 40 SB for EMS.   Pt got 1 mg of atropine.  18 IV Left FA  HR went to 70's after medication., BP 107/58, 2L O2 with 97%. BG 133  BP 82/50 before medication. Sats were 87% on RA.   Pt now AxO x4 per EMS.  Given 562m of fluid.

## 2021-04-08 ENCOUNTER — Encounter: Payer: Self-pay | Admitting: Internal Medicine

## 2021-04-08 ENCOUNTER — Other Ambulatory Visit: Payer: Self-pay

## 2021-04-08 ENCOUNTER — Ambulatory Visit (INDEPENDENT_AMBULATORY_CARE_PROVIDER_SITE_OTHER): Payer: Medicare Other | Admitting: Internal Medicine

## 2021-04-08 VITALS — BP 130/60 | HR 56 | Ht 73.0 in | Wt 261.2 lb

## 2021-04-08 DIAGNOSIS — Z7901 Long term (current) use of anticoagulants: Secondary | ICD-10-CM

## 2021-04-08 DIAGNOSIS — E119 Type 2 diabetes mellitus without complications: Secondary | ICD-10-CM | POA: Diagnosis not present

## 2021-04-08 DIAGNOSIS — Z794 Long term (current) use of insulin: Secondary | ICD-10-CM

## 2021-04-08 DIAGNOSIS — I480 Paroxysmal atrial fibrillation: Secondary | ICD-10-CM | POA: Diagnosis not present

## 2021-04-08 DIAGNOSIS — I10 Essential (primary) hypertension: Secondary | ICD-10-CM | POA: Diagnosis not present

## 2021-04-08 DIAGNOSIS — R55 Syncope and collapse: Secondary | ICD-10-CM

## 2021-04-08 NOTE — Patient Instructions (Addendum)
Testing/Procedures: Your physician has recommended that you have a loop recorder implant. Please follow the instructions below, located under the special instructions section.    You are scheduled for a Loop Recorder Implant on 05/19/21  at 11:40 am, with Dr. Sallyanne Kuster. This will take place at [place], [address].    Please arrive at your appointment 15-20 minutes early.   You do not need to be fasting.   The procedure is performed with local anesthesia. You will not receive sedatives nor will an IV be placed.   Wash your chest and neck with the surgical soap the evening before and the morning of your procedure. Please following the washing instructions provided.   As with all surgical implants, there is a small risk of infection. If an infection occurs, the device will be removed. To help reduce the risk, please use the surgical scrub provided. Additional antiseptic precautions will be taken at the time of the procedure.   Please bring your insurance cards.   *Please note that scheduled loop recorder implants may need to be rescheduled if Dr. Sallyanne Kuster has a procedure urgently added on at the hospital   Preparing for the Procedure  Before the procedure, you can play an important role. Because skin is not sterile, your skin needs to be as free of germs as possible. You can reduce the number of germs on your skin by washing with CHG (chlorhexidine gluconate) Soap before the procedure. CHG is an antiseptic cleaner which kills germs and bonds with the skin to continue killing germs even after washing.  Please do not use if you have an allergy to CHG or antibacterial soaps. If your skin becomes reddened/irritated, STOP using the CHG.  DO NOT SHAVE (including legs and underarms) for at least 48 hours prior to first CHG shower. It is OK to shave your face.  Please follow these instructions carefully: Shower the night before the procedure and the morning of with CHG Soap. If you  chose to wash your hair, wash your hair first as usual with your normal shampoo/conditioner. After you shampoo/condition, rinse you hair and body thoroughly to remove shampoo/conditioner. Use CHG as you would any other liquid soap. You can apply CHG directly to the skin and wash gently with a loofah or a clean washcloth. Apply the CHG Soap to your body ONLY FROM THE NECK DOWN. Do not use on open wounds or open sores. Avoid contact with your eyes, ears, mouth, and genitals (private parts).  Wash thoroughly, paying special attention to the area where your surgery will be performed. Thoroughly rinse your body with warm water from the neck down. DO NOT shower/wash with your normal soap after using and rinsing off the CHG Soap. Pat yourself dry with a clean towel. Wear clean pajamas to bed. Place clean sheets on your bed the night of your first shower and do not sleep with pets..  Day of Surgery: Shower with the CHG Soap following the instructions listed above. DO NOT apply deodorants or lotions.       Follow-Up: At [facility], you and your health needs are our priority.  As part of our continuing mission to provide you with exceptional heart care, we have created designated Provider Care Teams.  These Care Teams include your primary Cardiologist (physician) and Advanced Practice Providers (APPs -  Physician Assistants and Nurse Practitioners) who all work together to provide you with the care you need, when you need it.  We recommend signing up for  the patient portal called "MyChart".  Sign up information is provided on this After Visit Summary.  MyChart is used to connect with patients for Virtual Visits (Telemedicine).  Patients are able to view lab/test results, encounter notes, upcoming appointments, etc.  Non-urgent messages can be sent to your provider as well.   To learn more about what you can do with MyChart, go to [email or web address].    Your next appointment:   6  month(s)  The format for your next appointment:   In Person  Provider:   K. Mali Hilty, MD

## 2021-04-09 NOTE — Progress Notes (Signed)
OFFICE NOTE  Chief Complaint:  Recent syncope  Primary Care Physician: [name], MD  HPI:  Ronald Dorsey is a 70 y.o. male with a past medial history significant for paroxysmal atrial fibrillation which was seen during cardiac catheterization.  I first met him in the hospital which time he was having symptoms concerning for unstable angina.  He underwent left heart catheterization in January 2020 which showed minimal nonobstructive coronary disease however he was found to be in new onset atrial fibrillation.  Subsequently he was placed on Eliquis but had never come back to the office for follow-up.  Recently was seen in the emergency department after having had a syncopal episode.  Apparently EMS was dispatched to his house and he was somewhat confused on arrival.  His wife noted that he had had a syncopal episode with very little warning.  He was noted to be bradycardic and was given atropine with marked improvement in his symptoms.  Heart rate is in the 50s today however he said it was in the 40s apparently at that time.  There was some nausea and diaphoresis.  The episode overall sounds like it might be a vagal episode however given his history of arrhythmias, it sounds like he could also have possibly had a bradycardic event.  He also has insulin-dependent diabetes however he said blood sugar was assessed and was I believe 135 mg/dL by EMS.  PMHx:  Past Medical History:  Diagnosis Date   Apnea    Arthritis    Diabetes mellitus without complication (HCC)    Hypercholesterolemia    Hypertension    Neuromuscular disorder ([place])    sciatica   Paroxysmal atrial fibrillation (HCC)    PSVT (paroxysmal supraventricular tachycardia) ([place])     Past Surgical History:  Procedure Laterality Date   CARPAL TUNNEL RELEASE  2010   left wrist   EYE SURGERY     LEFT HEART CATH AND CORONARY ANGIOGRAPHY N/A 09/23/2018   Procedure: LEFT HEART CATH AND CORONARY ANGIOGRAPHY;  Surgeon:  Burnell Blanks, MD;  Location: [place] [facility];  Service: Cardiovascular;  Laterality: N/A;   TONSILLECTOMY  1961    FAMHx:  Family History  Problem Relation Age of Onset   Hypertension Father     SOCHx:   reports that he has quit smoking. He has quit using smokeless tobacco. He reports current alcohol use. He reports that he does not use drugs.  ALLERGIES:  Allergies  Allergen Reactions   Eggs Or Egg-Derived Products Nausea And Vomiting    ROS: Pertinent items noted in HPI and remainder of comprehensive ROS otherwise negative.  HOME MEDS: Current Outpatient Medications on File Prior to Visit  Medication Sig Dispense Refill   amLODipine (NORVASC) 10 MG tablet Take 1 tablet (10 mg total) by mouth daily. 90 tablet 3   apixaban (ELIQUIS) 5 MG TABS tablet Take 1 tablet (5 mg total) by mouth 2 (two) times daily. 90 tablet 5   empagliflozin (JARDIANCE) 25 MG TABS tablet TAKE 1 TABLET BY MOUTH EVERY MORNING BEFORE BREAKFAST 90 tablet 3   ezetimibe (ZETIA) 10 MG tablet TAKE 1 TABLET(10 MG) BY MOUTH DAILY 90 tablet 3   hydrochlorothiazide (HYDRODIURIL) 25 MG tablet Take 1 tablet (25 mg total) by mouth daily. 90 tablet 3   insulin glargine (LANTUS) 100 UNIT/ML injection Inject 0.5 mLs (50 Units total) into the skin 2 (two) times daily with a meal. 90 mL 3   insulin lispro (HUMALOG) 100 UNIT/ML injection  Inject 0-0.3 mLs (0-30 Units total) into the skin 3 (three) times daily with meals. Per sliding scale. MAX DAILY DOSE 90U 110 mL 2   Insulin Syringes, Disposable, (B-D INSULIN SYRINGE 1CC) U-100 1 ML MISC Use as directed to inject insulin 5x daily. Dx:E11.65. 500 each 1   lisinopril (ZESTRIL) 20 MG tablet Take 2 tablets (40 mg total) by mouth daily. 180 tablet 0   metFORMIN (GLUCOPHAGE) 500 MG tablet Take 2 tablets (1,000 mg total) by mouth 2 (two) times daily. 360 tablet 2   metoprolol succinate (TOPROL-XL) 25 MG 24 hr tablet TAKE 1 TABLET BY MOUTH DAILY 90 tablet 3   omega-3  acid ethyl esters (LOVAZA) 1 g capsule Take 2 capsules (2 g total) by mouth 2 (two) times daily. 360 capsule 0   pioglitazone (ACTOS) 30 MG tablet Take 1 tablet (30 mg total) by mouth daily before breakfast. 90 tablet 3   pregabalin (LYRICA) 150 MG capsule TAKE 1 CAPSULE BY MOUTH TWICE A DAY 180 capsule 0   rosuvastatin (CRESTOR) 20 MG tablet Take 1 tablet (20 mg total) by mouth daily. 90 tablet 3   No current facility-administered medications on file prior to visit.    LABS/IMAGING: No results found for this or any previous visit (from the past 48 hour(s)). No results found.  LIPID PANEL:    Component Value Date/Time   CHOL 109 11/25/2020 0826   TRIG 122 11/25/2020 0826   HDL 43 11/25/2020 0826   CHOLHDL 2.5 11/25/2020 0826   VLDL 12 09/23/2018 0218   LDLCALC 46 11/25/2020 0826     WEIGHTS: Wt Readings from Last 3 Encounters:  04/08/21 261 lb 3.2 oz (118.5 kg)  04/06/21 255 lb (115.7 kg)  11/25/20 253 lb (114.8 kg)    VITALS: BP 130/60 (BP Location: Right Arm)   Pulse (!) 56   Ht _0  (1.854 m)   Wt 261 lb 3.2 oz (118.5 kg)   SpO2 96%   BMI 34.46 kg/m   EXAM: General appearance: alert and no distress Neck: no carotid bruit, no JVD, and thyroid not enlarged, symmetric, no tenderness/mass/nodules Lungs: clear to auscultation bilaterally Heart: regular rate and rhythm, S1, S2 normal, no murmur, click, rub or gallop Abdomen: soft, non-tender; bowel sounds normal; no masses,  no organomegaly Extremities: extremities normal, atraumatic, no cyanosis or edema Pulses: 2+ and symmetric Skin: Skin color, texture, turgor normal. No rashes or lesions Neurologic: Grossly normal Psych: Pleasant  EKG: Deferred  ASSESSMENT: Syncope History of PAF-CHA2DS2-VASc score of 2 Anticoagulation Eliquis Bradycardia  PLAN: 1.   Ronald Dorsey had a syncopal episode without any clear warning.  There was some nausea and diaphoresis which could suggest a vagal reaction.  He received some  atropine with a prompt response again suggesting a vagal tone issue however arrhythmia cannot be excluded.  Has not had any further episodes.  He is not any A. fib.  He was in a sinus rhythm in the emergency department.  He is doing well with the Eliquis.  We will continue that.  I recommend an implanted loop recorder which would give [place] our best ability to pick up on a possibly infrequent arrhythmia if he were to have recurrent syncopal episodes.  This would also help [place] to monitor his burden of atrial fibrillation over the next few years.  He is agreeable to that.  I will reach out to Dr. Sallyanne Kuster to see if we can get this placed in the office.  Pixie Casino,  MD, FACC, [place] Director of the Advanced Lipid Disorders &  Cardiovascular Risk Reduction Clinic Diplomate of the American Board of Clinical Lipidology Attending Cardiologist  Direct Dial: [phone]  Fax: [phone]  Website:  [email or web address] [email or web address] Nitisha Civello 04/09/2021, 10:05 AM

## 2021-05-12 ENCOUNTER — Telehealth: Payer: Self-pay | Admitting: Cardiovascular Disease

## 2021-05-12 NOTE — Telephone Encounter (Signed)
Wife of patient called. The patient needs to r/s his ILR implant for 05/19/21. The patient has another appointment the same day

## 2021-05-12 NOTE — Telephone Encounter (Signed)
Spoke with the patient's wife. Procedure moved to 10/3 at noon

## 2021-05-12 NOTE — Telephone Encounter (Signed)
Returned call to wife-patient is scheduled for ILR implant on 9/26 and has another appt at [facility] the same day.  They tried to reschedule this appointment but the next available was in March.  She is requesting to reschedule loop implant for another day.    Advised would send to Dr. Victorino December nurse to review for rescheduling implant.

## 2021-05-19 ENCOUNTER — Ambulatory Visit: Payer: Medicare Other | Admitting: Cardiovascular Disease

## 2021-05-24 ENCOUNTER — Encounter: Payer: Self-pay | Admitting: *Deleted

## 2021-05-25 ENCOUNTER — Other Ambulatory Visit: Payer: Self-pay | Admitting: Family Medicine

## 2021-05-25 DIAGNOSIS — G709 Myoneural disorder, unspecified: Secondary | ICD-10-CM

## 2021-05-26 ENCOUNTER — Ambulatory Visit: Payer: Medicare Other | Admitting: Cardiovascular Disease

## 2021-05-26 ENCOUNTER — Telehealth: Payer: Self-pay | Admitting: *Deleted

## 2021-05-26 NOTE — Telephone Encounter (Signed)
Left a message times two to reschedule the patient's loop recorder implant for today with Dr. Sallyanne Kuster due to an emergency. The next available time is 10/31 at noon.

## 2021-05-27 ENCOUNTER — Telehealth: Payer: Self-pay | Admitting: Pharmacist

## 2021-05-27 ENCOUNTER — Telehealth: Payer: Self-pay | Admitting: *Deleted

## 2021-05-27 MED ORDER — FREESTYLE LIBRE 14 DAY SENSOR MISC: 11 refills | Status: AC

## 2021-05-27 NOTE — Telephone Encounter (Signed)
-----   Message from Rosetta Posner sent at 05/27/2021  2:49 PM EDT ----- Regarding: Medication Refill Patient stated he needs a refill for his Freestyle libre 2. Please advise.

## 2021-05-27 NOTE — Telephone Encounter (Signed)
Prescription sent to pharmacy.

## 2021-05-27 NOTE — Progress Notes (Signed)
Chronic Care Management Pharmacy Assistant   Name: Ronald Dorsey  MRN: [id number] DOB: September 26, 1950  Reason for Encounter: Disease State For DM.    Conditions to be addressed/monitored: HTN, Afib, DM, HLD  Recent office visits:  None since 03/31/21  Recent consult visits:  04/08/21 Cardiology Hilty, Nadean Corwin, MD. For follow-up. No medication changes.  Hospital visits:  04/06/21 [facility] Emergency Department (5 Hours) Wynona Dove A, DO. For bradycardia. No medication changes.   Medications: Outpatient Encounter Medications as of 05/27/2021  Medication Sig   amLODipine (NORVASC) 10 MG tablet Take 1 tablet (10 mg total) by mouth daily.   apixaban (ELIQUIS) 5 MG TABS tablet Take 1 tablet (5 mg total) by mouth 2 (two) times daily.   empagliflozin (JARDIANCE) 25 MG TABS tablet TAKE 1 TABLET BY MOUTH EVERY MORNING BEFORE BREAKFAST   ezetimibe (ZETIA) 10 MG tablet TAKE 1 TABLET(10 MG) BY MOUTH DAILY   hydrochlorothiazide (HYDRODIURIL) 25 MG tablet Take 1 tablet (25 mg total) by mouth daily.   insulin glargine (LANTUS) 100 UNIT/ML injection Inject 0.5 mLs (50 Units total) into the skin 2 (two) times daily with a meal.   insulin lispro (HUMALOG) 100 UNIT/ML injection Inject 0-0.3 mLs (0-30 Units total) into the skin 3 (three) times daily with meals. Per sliding scale. MAX DAILY DOSE 90U   Insulin Syringes, Disposable, (B-D INSULIN SYRINGE 1CC) U-100 1 ML MISC Use as directed to inject insulin 5x daily. Dx:E11.65.   lisinopril (ZESTRIL) 20 MG tablet Take 2 tablets (40 mg total) by mouth daily.   metFORMIN (GLUCOPHAGE) 500 MG tablet Take 2 tablets (1,000 mg total) by mouth 2 (two) times daily.   metoprolol succinate (TOPROL-XL) 25 MG 24 hr tablet TAKE 1 TABLET BY MOUTH DAILY   omega-3 acid ethyl esters (LOVAZA) 1 g capsule Take 2 capsules (2 g total) by mouth 2 (two) times daily.   pioglitazone (ACTOS) 30 MG tablet Take 1 tablet (30 mg total) by mouth daily before  breakfast.   pregabalin (LYRICA) 150 MG capsule TAKE 1 CAPSULE BY MOUTH TWICE A DAY   rosuvastatin (CRESTOR) 20 MG tablet Take 1 tablet (20 mg total) by mouth daily.   No facility-administered encounter medications on file as of 05/27/2021.   Recent Relevant Labs: Lab Results  Component Value Date/Time   HGBA1C 7.9 (H) 11/25/2020 08:26 AM   HGBA1C 8.1 (H) 05/20/2020 08:20 AM   MICROALBUR 1.6 11/25/2020 08:26 AM   MICROALBUR 1.4 05/20/2020 08:20 AM    Kidney Function Lab Results  Component Value Date/Time   CREATININE 0.97 04/06/2021 05:19 PM   CREATININE 1.00 11/25/2020 08:26 AM   CREATININE 1.15 05/20/2020 08:20 AM   GFRNONAA >60 04/06/2021 05:19 PM   GFRNONAA 76 11/25/2020 08:26 AM   GFRAA 89 11/25/2020 08:26 AM    Current antihyperglycemic regimen:  Metformin 500mg  bid Jardiance 25mg  daily Lantus 50 units bid Humalog 0-30 units tid with meals per sliding scale Pioglitazone 30mg  daily  What recent interventions/DTPs have been made to improve glycemic control:  None.   Have there been any recent hospitalizations or ED visits since last visit with CPP? Yes, documented above.   Patient denies hypoglycemic symptoms, including None  Patient denies hyperglycemic symptoms, including none  How often are you checking your blood sugar? Patient stated he uses a freestyle libre.  What are your blood sugars ranging?  Patient stated his blood sugars range around 95-120.  During the week, how often does your blood glucose drop  below 70? Patient stated Never.  Are you checking your feet daily/regularly?  Patient was reminded to check his feet regularly.   Adherence Review: Is the patient currently on a STATIN medication? Rosuvastatin 20 mg  Is the patient currently on ACE/ARB medication?  Lisinopril 20 mg  Does the patient have >5 day gap between last estimated fill dates? Per misc rpts, yes.   Care Gaps:Patient is due for his foot exam. Patients updated labs.  Star  Rating Drugs: Lisinopril 20 mg 02/04/21 90 DS, Jardiance 25 mg 03/02/21 90 DS, Metformin 500 mg 03/02/21 90 DS, Pioglitazone 30 mg 03/29/21 90 DS, Rosuvastatin 20 mg 03/29/21 90 DS.  Follow-Up:Pharmacist Review  Charlann Lange, [place] Pharmacist Assistant [phone]

## 2021-05-27 NOTE — Telephone Encounter (Signed)
Ok to refill??  Last office visit 11/25/2020.  Last refill 02/06/2021.

## 2021-05-28 ENCOUNTER — Encounter: Payer: Self-pay | Admitting: Family Medicine

## 2021-06-11 DIAGNOSIS — Z23 Encounter for immunization: Secondary | ICD-10-CM | POA: Diagnosis not present

## 2021-06-23 ENCOUNTER — Ambulatory Visit (INDEPENDENT_AMBULATORY_CARE_PROVIDER_SITE_OTHER): Payer: Medicare Other | Admitting: Cardiovascular Disease

## 2021-06-23 ENCOUNTER — Other Ambulatory Visit: Payer: Self-pay

## 2021-06-23 DIAGNOSIS — Z95818 Presence of other cardiac implants and grafts: Secondary | ICD-10-CM

## 2021-06-23 DIAGNOSIS — R55 Syncope and collapse: Secondary | ICD-10-CM | POA: Diagnosis not present

## 2021-06-23 MED ORDER — LIDOCAINE-EPINEPHRINE (PF) 1 %-1:200000 IJ SOLN: 10.0000 mL | Freq: Once | INTRAMUSCULAR | Status: DC

## 2021-06-23 NOTE — Patient Instructions (Signed)
Discharge Instructions for  Loop Recorder Explant/Implant    Follow up: Keep your wound check appointment on 07/07/21 at 11:40. This will be a MyChart Video visit.  If you have any questions or concerns, please call the office at [phone].  ACTIVITY No restrictions. DO wear your seatbelt, even if it crosses over the site.   WOUND CARE Keep the wound area clean and dry.  Remove the dressing the day after (usually 24 hours after the procedure). DO NOT SUBMERGE UNDER WATER UNTIL FULLY HEALED (no tub baths, hot tubs, swimming pools, etc.).  You  may shower or take a sponge bath after the dressing is removed. DO NOT SOAK the area and do not allow the shower to directly spray on the site. If you have tape/steri-strips on your wound, these will fall off; do not pull them off prematurely.   No bandage is needed on the site.  DO  NOT apply any creams, oils, or ointments to the wound area. If you notice any drainage or discharge from the wound, any swelling, excessive redness or bruising at the site, or if you develop a fever > 101? F, call the office at once at [phone].

## 2021-06-23 NOTE — Progress Notes (Addendum)
LOOP RECORDER IMPLANT   Procedure report  Procedure performed:  Loop recorder implantation   Reason for procedure:  1. Syncope/near-syncope Procedure performed by:  Sanda Klein, MD  Complications:  None  Estimated blood loss:  <5 mL  Medications administered during procedure:  Lidocaine 1% with 1/100,000 epinephrine 10 mL locally Device details:  Medtronic Reveal Linq model number [id number], serial number [id number] Procedure details:  After the risks and benefits of the procedure were discussed the patient provided informed consent. The patient was prepped and draped in usual sterile fashion. Local anesthesia was administered to an area 2 cm to the left of the sternum in the 4th intercostal space. A cutaneous incision was made using the incision tool. The introducer was then used to create a subcutaneous tunnel and carefully deploy the device. Local pressure was held to ensure hemostasis.  The incision was closed with SteriStrips and a sterile dressing was applied. R waves 0.37 mV.  Sanda Klein, MD, [facility] [phone] office [phone] pager 06/23/2021 12:56 PM

## 2021-06-23 NOTE — Progress Notes (Signed)
Cardiology Office Note:    Date:  06/23/2021   ID:  Ronald Dorsey, DOB 10-Apr-1951, MRN [id number]  PCP:  [name], MD   [facility] Providers Cardiologist:  Pixie Casino, MD     Referring MD: [name], MD   Chief complaint: Syncope  History of Present Illness:    Ronald Dorsey is a 70 y.o. male with a hx of DM type 2, paroxysmal atrial fibrillation and an episode of syncope associated with mild sinus bradycardia (see Dr. Lysbeth Penner note 04/08/2021). He had a very brief warning period of dizziness. His wife reports he was sitting in chair and never completely lost consciousness, but was unresponsive. No tonic-clonic activity. He has no recollection of the event. Blood glucose was normal.  The patient specifically denies any chest pain at rest exertion, dyspnea at rest or with exertion, orthopnea, paroxysmal nocturnal dyspnea, palpitations, focal neurological deficits, intermittent claudication, lower extremity edema, unexplained weight gain, cough, hemoptysis or wheezing.   Past Medical History:  Diagnosis Date   Apnea    Arthritis    Diabetes mellitus without complication (HCC)    Hypercholesterolemia    Hypertension    Neuromuscular disorder (HCC)    sciatica   Paroxysmal atrial fibrillation (HCC)    PSVT (paroxysmal supraventricular tachycardia) ([place])     Past Surgical History:  Procedure Laterality Date   CARPAL TUNNEL RELEASE  2010   left wrist   EYE SURGERY     LEFT HEART CATH AND CORONARY ANGIOGRAPHY N/A 09/23/2018   Procedure: LEFT HEART CATH AND CORONARY ANGIOGRAPHY;  Surgeon: Burnell Blanks, MD;  Location: [place] [facility];  Service: Cardiovascular;  Laterality: N/A;   TONSILLECTOMY  1961    Current Medications: No outpatient medications have been marked as taking for the 06/23/21 encounter (Appointment) with Jhania Etherington, Dani Gobble, MD.     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  ROS:   Please see the history of present illness.     All other systems reviewed and are negative.  EKGs/Labs/Other Studies Reviewed:    The following studies were reviewed today: ECHO and CATH from [place]  EKG:  EKG is not ordered today.  The ekg ordered 04/06/2021 demonstrates NSR, normal tracing  Recent Labs: 11/25/2020: ALT 18 04/06/2021: BUN 26; Creatinine, Ser 0.97; Hemoglobin 14.7; Platelets 157; Potassium 3.6; Sodium 136; TSH  8.628  Recent Lipid Panel    Component Value Date/Time   CHOL 109 11/25/2020 0826   TRIG 122 11/25/2020 0826   HDL 43 11/25/2020 0826   CHOLHDL 2.5 11/25/2020 0826   VLDL 12 09/23/2018 0218   LDLCALC 46 11/25/2020 0826     Risk Assessment/Calculations:           Physical Exam:    VS:  There were no vitals taken for this visit.    Wt Readings from Last 3 Encounters:  04/08/21 261 lb 3.2 oz (118.5 kg)  04/06/21 255 lb (115.7 kg)  11/25/20 253 lb (114.8 kg)     GEN: Obese,  Well nourished, well developed in no acute distress HEENT: Normal NECK: No  JVD; No carotid bruits LYMPHATICS: No lymphadenopathy CARDIAC: RRR, no murmurs, rubs, gallops RESPIRATORY:  Clear to auscultation without rales, wheezing or rhonchi  ABDOMEN: Soft, non-tender, non-distended MUSCULOSKELETAL:  No edema; No deformity  SKIN: Warm and dry NEUROLOGIC:  Alert and oriented x 3 PSYCHIATRIC:  Normal affect   ASSESSMENT:    No diagnosis found. PLAN:    In order of problems listed above:  Unexplained syncope with minimal prodrome, suspicious for arrhythmia. He is here for implantable loop recorder. This procedure has been fully reviewed with the patient and written informed consent has been obtained.        Medication Adjustments/Labs and Tests Ordered: Current medicines are reviewed at length with the patient today.  Concerns regarding medicines are outlined above.  No orders of the defined types were placed in this encounter.  No orders of the defined types were placed in this encounter.   There are no Patient Instructions on file for this visit.   Signed, Sanda Klein, MD  06/23/2021 11:21 AM    [place]

## 2021-06-27 ENCOUNTER — Other Ambulatory Visit: Payer: Self-pay | Admitting: Family Medicine

## 2021-06-27 DIAGNOSIS — E119 Type 2 diabetes mellitus without complications: Secondary | ICD-10-CM

## 2021-06-27 DIAGNOSIS — I10 Essential (primary) hypertension: Secondary | ICD-10-CM

## 2021-06-27 DIAGNOSIS — Z794 Long term (current) use of insulin: Secondary | ICD-10-CM

## 2021-07-07 ENCOUNTER — Telehealth (INDEPENDENT_AMBULATORY_CARE_PROVIDER_SITE_OTHER): Payer: Medicare Other | Admitting: Cardiovascular Disease

## 2021-07-07 DIAGNOSIS — Z95818 Presence of other cardiac implants and grafts: Secondary | ICD-10-CM

## 2021-07-07 NOTE — Progress Notes (Signed)
Unable to establish a video connection. He reports that the site has healed well, without swelling or drainage. "Steristrips are off. Healthy scar".  Proceed with monthly downloads.

## 2021-07-28 ENCOUNTER — Ambulatory Visit (INDEPENDENT_AMBULATORY_CARE_PROVIDER_SITE_OTHER): Payer: Medicare Other

## 2021-07-28 DIAGNOSIS — R55 Syncope and collapse: Secondary | ICD-10-CM

## 2021-07-29 LAB — CUP PACEART REMOTE DEVICE CHECK
Date Time Interrogation Session: 20221205193218
Implantable Pulse Generator Implant Date: 20221031

## 2021-07-31 ENCOUNTER — Telehealth: Payer: Medicare Other

## 2021-08-01 ENCOUNTER — Ambulatory Visit: Payer: Medicare Other

## 2021-08-04 DIAGNOSIS — E119 Type 2 diabetes mellitus without complications: Secondary | ICD-10-CM | POA: Diagnosis not present

## 2021-08-04 DIAGNOSIS — H179 Unspecified corneal scar and opacity: Secondary | ICD-10-CM | POA: Diagnosis not present

## 2021-08-04 DIAGNOSIS — H2513 Age-related nuclear cataract, bilateral: Secondary | ICD-10-CM | POA: Diagnosis not present

## 2021-08-04 DIAGNOSIS — H35033 Hypertensive retinopathy, bilateral: Secondary | ICD-10-CM | POA: Diagnosis not present

## 2021-08-04 DIAGNOSIS — H35361 Drusen (degenerative) of macula, right eye: Secondary | ICD-10-CM | POA: Diagnosis not present

## 2021-08-04 DIAGNOSIS — H57813 Brow ptosis, bilateral: Secondary | ICD-10-CM | POA: Diagnosis not present

## 2021-08-04 LAB — HM DIABETES EYE EXAM

## 2021-08-06 NOTE — Progress Notes (Signed)
Carelink Summary Report / Loop Recorder 

## 2021-08-08 ENCOUNTER — Other Ambulatory Visit: Payer: Self-pay

## 2021-08-08 MED ORDER — HUMALOG 100 UNIT/ML ~~LOC~~ SOLN: 0.0000 [IU] | Freq: Three times a day (TID) | SUBCUTANEOUS | 2 refills | Status: DC

## 2021-08-21 ENCOUNTER — Other Ambulatory Visit: Payer: Self-pay | Admitting: Family Medicine

## 2021-08-22 ENCOUNTER — Other Ambulatory Visit: Payer: Self-pay | Admitting: *Deleted

## 2021-08-22 ENCOUNTER — Encounter: Payer: Self-pay | Admitting: Family Medicine

## 2021-08-22 MED ORDER — BD INSULIN SYRINGE U-100 1 ML MISC: 1 refills | Status: DC

## 2021-08-26 ENCOUNTER — Encounter: Payer: Self-pay | Admitting: Family Medicine

## 2021-08-27 ENCOUNTER — Telehealth: Payer: Medicare Other

## 2021-08-28 ENCOUNTER — Ambulatory Visit: Payer: Medicare Other

## 2021-09-01 ENCOUNTER — Ambulatory Visit (INDEPENDENT_AMBULATORY_CARE_PROVIDER_SITE_OTHER): Payer: Medicare Other

## 2021-09-01 DIAGNOSIS — R55 Syncope and collapse: Secondary | ICD-10-CM | POA: Diagnosis not present

## 2021-09-01 LAB — CUP PACEART REMOTE DEVICE CHECK
Date Time Interrogation Session: 20230108230809
Implantable Pulse Generator Implant Date: 20221031

## 2021-09-01 NOTE — Progress Notes (Deleted)
Chronic Care Management Pharmacy Note  09/01/2021 Name:  Ronald Dorsey MRN:  [id number] DOB:  October 23, 1950  Summary: Initial PharmD visit for CCM.  Meds reviewed and updated.  Patient is interested in CGM to help better monitor his glucose.  Recommendations/Changes made from today's visit: Look into CGM options  Plan: FU 4 months PharmD  2 months glucose check   Subjective: Ronald Dorsey is an 71 y.o. year old male who is a primary patient of Pickard, Cammie Mcgee, MD.  The CCM team was consulted for assistance with disease management and care coordination needs.    Engaged with patient face to face for initial visit in response to provider referral for pharmacy case management and/or care coordination services.   Consent to Services:  The patient was given the following information about Chronic Care Management services today, agreed to services, and gave verbal consent: 1. CCM service includes personalized support from designated clinical staff supervised by the primary care provider, including individualized plan of care and coordination with other care providers 2. 24/7 contact phone numbers for assistance for urgent and routine care needs. 3. Service will only be billed when office clinical staff spend 20 minutes or more in a month to coordinate care. 4. Only one practitioner may furnish and bill the service in a calendar month. 5.The patient may stop CCM services at any time (effective at the end of the month) by phone call to the office staff. 6. The patient will be responsible for cost sharing (co-pay) of up to 20% of the service fee (after annual deductible is met). Patient agreed to services and consent obtained.  Patient Care Team: [name], MD as PCP - General (Family Medicine) Debara Pickett Nadean Corwin, MD as PCP - Cardiology (Cardiology) Edythe Clarity, [facility] as Pharmacist (Pharmacist)  Recent office visits:  11/25/20 Dr. Dennard Schaumann For follow-up. No medication changes.     Recent consult visits:  None in the last six months   Hospital visits:  None in previous 6 months   Medication History: Lisinopril 20 mg 90 DS 02/04/21 Empagliflozin 25 mg 90 DS 03/02/21. Metformin 500 mg 90 DS 03/02/21 Pioglitazone 30 mg 90 DS 12/29/20 Rosuvastatin 20 mg 90 DS 12/29/20   Objective:  Lab Results  Component Value Date   CREATININE 0.97 04/06/2021   BUN 26 (H) 04/06/2021   GFRNONAA >60 04/06/2021   GFRAA 89 11/25/2020   NA 136 04/06/2021   K 3.6 04/06/2021   CALCIUM 9.1 04/06/2021   CO2 25 04/06/2021   GLUCOSE 133 (H) 04/06/2021    Lab Results  Component Value Date/Time   HGBA1C 7.9 (H) 11/25/2020 08:26 AM   HGBA1C 8.1 (H) 05/20/2020 08:20 AM   MICROALBUR 1.6 11/25/2020 08:26 AM   MICROALBUR 1.4 05/20/2020 08:20 AM    Last diabetic Eye exam:  Lab Results  Component Value Date/Time   HMDIABEYEEXA No Retinopathy 08/02/2020 08:30 AM    Last diabetic Foot exam: No results found for: HMDIABFOOTEX   Lab Results  Component Value Date   CHOL 109 11/25/2020   HDL 43 11/25/2020   LDLCALC 46 11/25/2020   TRIG 122 11/25/2020   CHOLHDL 2.5 11/25/2020    Hepatic Function Latest Ref Rng & Units 11/25/2020 05/20/2020 12/28/2019  Total Protein 6.1 - 8.1 g/dL 6.7 6.7 6.9  Albumin 3.5 - 5.0 g/dL - - -  AST 10 - 35 U/L '22 21 19  ' ALT 9 - 46 U/L '18 19 24  ' Alk Phosphatase 38 - 126 U/L - - -  Total Bilirubin 0.2 - 1.2 mg/dL 0.7 0.4 0.7    Lab Results  Component Value Date/Time   TSH 8.628 (H) 04/06/2021 05:19 PM   TSH 1.227 09/23/2018 05:24 PM   FREET4 0.86 04/06/2021 05:19 PM    CBC Latest Ref Rng & Units 04/06/2021 11/25/2020 12/28/2019  WBC 4.0 - 10.5 K/uL 12.6(H) 7.4 8.4  Hemoglobin 13.0 - 17.0 g/dL 14.7 16.5 15.4  Hematocrit 39.0 - 52.0 % 43.8 50.1(H) 46.9  Platelets 150 - 400 K/uL 157 155 286    No results found for: VD25OH  Clinical ASCVD: Yes  The ASCVD Risk score (Arnett DK, et al., 2019) failed to calculate for the following reasons:   The valid total  cholesterol range is 130 to 320 mg/dL    Depression screen [facility] 2/9 11/25/2020 11/27/2019 06/16/2018  Decreased Interest 0 0 0  Down, Depressed, Hopeless 0 0 0  PHQ - 2 Score 0 0 0  Altered sleeping - - -  Tired, decreased energy - - -  Change in appetite - - -  Feeling bad or failure about yourself  - - -  Trouble concentrating - - -  Moving slowly or fidgety/restless - - -  Suicidal thoughts - - -  PHQ-9 Score - - -  Difficult doing work/chores - - -     Social History   Tobacco Use  Smoking Status Former  Smokeless Tobacco Former   BP Readings from Last 3 Encounters:  04/08/21 130/60  04/06/21 (!) 114/48  11/25/20 136/68   Pulse Readings from Last 3 Encounters:  04/08/21 (!) 56  04/06/21 (!) 59  11/25/20 (!) 58   Wt Readings from Last 3 Encounters:  04/08/21 261 lb 3.2 oz (118.5 kg)  04/06/21 255 lb (115.7 kg)  11/25/20 253 lb (114.8 kg)   BMI Readings from Last 3 Encounters:  04/08/21 34.46 kg/m  04/06/21 33.64 kg/m  11/25/20 33.38 kg/m    Assessment/Interventions: Review of patient past medical history, allergies, medications, health status, including review of consultants reports, laboratory and other test data, was performed as part of comprehensive evaluation and provision of chronic care management services.   SDOH:  (Social Determinants of Health) assessments and interventions performed: Yes  Financial Resource Strain: Low Risk    Difficulty of Paying Living Expenses: Not hard at all    SDOH Screenings   Alcohol Screen: Low Risk    Last Alcohol Screening Score (AUDIT): 0  Depression (PHQ2-9): Low Risk    PHQ-2 Score: 0  Financial Resource Strain: Low Risk    Difficulty of Paying Living Expenses: Not hard at all  Food Insecurity: Not on file  Housing: Not on file  Physical Activity: Not on file  Social Connections: Not on file  Stress: Not on file  Tobacco Use: Medium Risk   Smoking Tobacco Use: Former   Smokeless Tobacco Use: Former    Passive Exposure: Not on [employer] consultant Needs: Not on file    Home Garden  Allergies  Allergen Reactions   Eggs Or Egg-Derived Products Nausea And Vomiting    Medications Reviewed Today     Reviewed by Alvin Critchley, CMA (Certified Medical Assistant) on 04/08/21 at [place] List Status: <None>   Medication Order Taking? Sig Documenting Provider Last Dose Status Informant  amLODipine (NORVASC) 10 MG tablet [id number] Yes Take 1 tablet (10 mg total) by mouth daily. [name], MD Taking Active   apixaban [facility]) 5 MG TABS tablet [id number] Yes Take 1  tablet (5 mg total) by mouth 2 (two) times daily. [name], MD Taking Active   empagliflozin (JARDIANCE) 25 MG TABS tablet [id number] Yes TAKE 1 TABLET BY MOUTH EVERY MORNING BEFORE BREAKFAST [name], MD Taking Active   ezetimibe (ZETIA) 10 MG tablet [id number] Yes TAKE 1 TABLET(10 MG) BY MOUTH DAILY [name], MD Taking Active   hydrochlorothiazide (HYDRODIURIL) 25 MG tablet [id number] Yes Take 1 tablet (25 mg total) by mouth daily. [name], MD Taking Active   insulin glargine (LANTUS) 100 UNIT/ML injection [id number] Yes Inject 0.5 mLs (50 Units total) into the skin 2 (two) times daily with a meal. [name], MD Taking Active   insulin lispro (HUMALOG) 100 UNIT/ML injection [id number] Yes Inject 0-0.3 mLs (0-30 Units total) into the skin 3 (three) times daily with meals. Per sliding scale. MAX DAILY DOSE 90U [name], MD Taking Active   Insulin Syringes, Disposable, (B-D INSULIN SYRINGE 1CC) U-100 1 ML MISC [id number] Yes Use as directed to inject insulin 5x daily. Dx:E11.65. [name], MD Taking Active   lisinopril (ZESTRIL) 20 MG tablet [id number] Yes Take 2 tablets (40 mg total) by mouth daily. [name], MD Taking Active   metFORMIN (GLUCOPHAGE) 500 MG tablet [id number] Yes Take 2 tablets (1,000 mg total) by mouth 2 (two) times daily. [name], MD Taking  Active   metoprolol succinate (TOPROL-XL) 25 MG 24 hr tablet [id number] Yes TAKE 1 TABLET BY MOUTH DAILY [name], MD Taking Active   omega-3 acid ethyl esters (LOVAZA) 1 g capsule [id number] Yes Take 2 capsules (2 g total) by mouth 2 (two) times daily. [name], MD Taking Active   pioglitazone (ACTOS) 30 MG tablet [id number] Yes Take 1 tablet (30 mg total) by mouth daily before breakfast. [name], MD Taking Active   pregabalin (LYRICA) 150 MG capsule [id number] Yes TAKE 1 CAPSULE BY MOUTH TWICE A DAY [name], MD Taking Active   rosuvastatin (CRESTOR) 20 MG tablet [id number] Yes Take 1 tablet (20 mg total) by mouth daily. [name], MD Taking Active             Patient Active Problem List   Diagnosis Date Noted   Paroxysmal atrial fibrillation [facility])    Hypertension    Hypercholesterolemia    Diabetes mellitus without complication ([place])    Apnea    PSVT (paroxysmal supraventricular tachycardia) ([place])     Immunization History  Administered Date(s) Administered   Influenza Split 05/24/2013   Influenza, High Dose Seasonal PF 06/16/2018, 05/11/2019, 05/11/2019   Influenza,inj,Quad PF,6+ Mos 08/20/2015, 04/23/2016   PFIZER(Purple Top)SARS-COV-2 Vaccination 09/10/2019, 09/30/2019, 05/24/2020   Pneumococcal Conjugate-13 04/05/2017   Pneumococcal Polysaccharide-23 08/24/2008, 02/01/2015   Tdap 11/02/2006, 02/17/2017    Conditions to be addressed/monitored:  HTN, Afib, DM, HLD  There are no care plans that you recently modified to display for this patient.     Medication Assistance: None required.  Patient affirms current coverage meets needs.  Compliance/Adherence/Medication fill history: Care Gaps: Foot exam  Star-Rating Drugs: Lisinopril 20 mg 90 DS 02/04/21 Empagliflozin 25 mg 90 DS 03/02/21. Metformin 500 mg 90 DS 03/02/21 Pioglitazone 30 mg 90 DS 12/29/20 Rosuvastatin 20 mg 90 DS 12/29/20  Patient's preferred pharmacy is:  [facility], [place] [address] Phone: [phone] Fax: [phone]  [facility], [place]  [place]  [address] Phone: [phone] Fax: [phone]  [id number] [id number]: [phone]: [phone]  Uses pill box? Yes Pt endorses 100% compliance  We discussed: Benefits of medication synchronization, packaging and delivery as well as enhanced pharmacist oversight with [employer]. Patient decided to: Continue current medication management strategy  Care Plan and Follow Up Patient Decision:  Patient agrees to Care Plan and Follow-up.  Plan: The care management team will reach out to the patient again over the next 120 days.  CBeverly Milch PharmD Clinical Pharmacist BJonni SangerFamily Medicine ([phone]    Current Barriers:  Unable to achieve control of glucose   Pharmacist Clinical Goal(s):  Patient will achieve control of glucose as evidenced by A1c adhere to plan to optimize therapeutic regimen for DM as evidenced by report of adherence to recommended medication management changes contact provider office for questions/concerns as evidenced notation of same in electronic health record through collaboration with PharmD and provider.   Interventions: 1:1 collaboration with PSusy Frizzle MD regarding development and update of comprehensive plan of care as evidenced by provider attestation and co-signature Inter-disciplinary care team collaboration (see longitudinal plan of care) Comprehensive medication review performed; medication list updated in electronic medical record  Hypertension  (Status:New goal.)   Med Management Intervention: home monitoring reviewed  (BP goal  <130/80) -Controlled -Current treatment: Amlodipine 143mdaily HCTZ 2524maily Lisinopril 65m85mily Toprol XL 25mg68mly -Medications previously tried: none noted  -Current home readings: "normal" unless stressful day at work -Current dietary habits: does try to watch what he eats, [place] will eat his pastas and other foods sometimes he knows are not the best for sugar -Current exercise habits: minimal outside of work -Denies hypotensive/hypertensive symptoms -Educated on BP goals and benefits of medications for prevention of heart attack, stroke and kidney damage; Importance of home blood pressure monitoring; Symptoms of hypotension and importance of maintaining adequate hydration; -Counseled to monitor BP at home periodically, document, and provide log at future appointments -Recommended to continue current medication  Hyperlipidemia: (LDL goal < 70) -Controlled -Current treatment: Rosuvastatin 65mg 32my Zetia 10mg d39m -Medications previously tried: none noted  -Current dietary patterns: see HTN -Current exercise habits: see HTn -Educated on Cholesterol goals;  Benefits of statin for ASCVD risk reduction; Importance of limiting foods high in cholesterol; -Most recent LDL is excellent -Recommended to continue current medication  Diabetes (A1c goal <7%) -Not ideally controlled -Current medications: Metformin 500mg bi35mrdiance 25mg dai44mantus 50 units bid Humalog 0-30 units tid with meals per sliding scale Pioglitazone 30mg dail81medications previously tried: Invokana  -Current home glucose readings fasting glucose: 150 this am - no further logs available post prandial glucose: varies, sometimes up to 250-260 if he forgets to takes his insulin until later after the meal -Denies hypoglycemic/hyperglycemic symptoms  -Educated on A1c and blood sugar goals; Proper insulin injection technique; Prevention and management of hypoglycemic episodes; Continuous  glucose monitoring; Appropriate time for insulin dosing -Counseled to check feet daily and get yearly eye exams -Recommended to continue current medication Educated on CGM, patient very interested in some type of CGM and feels this would help better manage his glucose.  Will look into options for him.  Atrial Fibrillation (Goal: prevent stroke and major bleeding) -Controlled  -Current treatment: Rate control: Toprol XL 25mg  Anti83mulation:  Eliquis 70m BID -Medications previously tried: none noted -Home BP and HR readings: "normal"  -Counseled on increased risk of stroke due to Afib and benefits of anticoagulation for stroke prevention; bleeding risk associated with Eliquis and importance of self-monitoring for signs/symptoms of bleeding; -Recommended to continue current medication  Patient Goals/Self-Care Activities Patient will:  - take medications as prescribed focus on medication adherence by pill box check glucose daily, document, and provide at future appointments engage in dietary modifications by limiting carbs and sugars  Follow Up Plan: The care management team will reach out to the patient again over the next 120 days.

## 2021-09-03 DIAGNOSIS — Z23 Encounter for immunization: Secondary | ICD-10-CM | POA: Diagnosis not present

## 2021-09-09 ENCOUNTER — Telehealth: Payer: Medicare Other

## 2021-09-09 NOTE — Progress Notes (Signed)
Carelink Summary Report / Loop Recorder 

## 2021-09-11 ENCOUNTER — Ambulatory Visit: Payer: Medicare Other

## 2021-09-17 ENCOUNTER — Telehealth: Payer: Self-pay | Admitting: Family Medicine

## 2021-09-17 NOTE — Telephone Encounter (Signed)
Left message for patient to call back and schedule Medicare Annual Wellness Visit (AWV) in office.   If not able to come in office, please offer to do virtually or by telephone.  Left office number and my jabber [phone].  Last AWV:04/05/2017  Please schedule at anytime with Nurse Health Advisor.

## 2021-10-04 LAB — CUP PACEART REMOTE DEVICE CHECK
Date Time Interrogation Session: 20230210230620
Implantable Pulse Generator Implant Date: 20221031

## 2021-10-06 ENCOUNTER — Ambulatory Visit (INDEPENDENT_AMBULATORY_CARE_PROVIDER_SITE_OTHER): Payer: Medicare Other

## 2021-10-06 DIAGNOSIS — R55 Syncope and collapse: Secondary | ICD-10-CM | POA: Diagnosis not present

## 2021-10-08 NOTE — Progress Notes (Signed)
Carelink Summary Report / Loop Recorder 

## 2021-10-21 ENCOUNTER — Telehealth: Payer: Self-pay | Admitting: Pharmacist

## 2021-10-21 NOTE — Progress Notes (Signed)
Chronic Care Management Pharmacy Assistant   Name: Ronald Dorsey  MRN: [id number] DOB: [birth year]   Reason for Encounter: Disease State - General Adherence Call     Recent office visits:  None noted.  Recent consult visits:   08/04/21 Arnoldo Hooker, MD - Opthalmology - No notes available.   07/28/21 Mihai Croitoru - Syncope and collapse - No notes available.   07/07/21 Sanda Klein, MD - Cardiology (Video visit) - Status post placement of loop recorder - No changes. Follow up as scheduled.   06/23/21 Sanda Klein, MD - Cardiology (Video visit) - Status post placement of loop recorder - Loop recorder placed. Follow up for wound check on 07/07/21 as scheduled.    Hospital visits:  None in previous 6 months  Medications: Outpatient Encounter Medications as of 10/21/2021  Medication Sig   amLODipine (NORVASC) 10 MG tablet TAKE 1 TABLET BY MOUTH EVERY DAY   Continuous Blood Gluc Sensor (FREESTYLE LIBRE 14 DAY SENSOR) MISC Use as directed to monitor blood glucose continuously. Replace sensor Q14 days. Dx: e11.9   ELIQUIS 5 MG TABS tablet TAKE 1 TABLET BY MOUTH TWICE A DAY   ezetimibe (ZETIA) 10 MG tablet TAKE 1 TABLET BY MOUTH EVERY DAY   hydrochlorothiazide (HYDRODIURIL) 25 MG tablet TAKE 1 TABLET BY MOUTH EVERY DAY   insulin glargine (LANTUS) 100 UNIT/ML injection Inject 0.5 mLs (50 Units total) into the skin 2 (two) times daily with a meal.   insulin lispro (HUMALOG) 100 UNIT/ML injection Inject 0-0.3 mLs (0-30 Units total) into the skin 3 (three) times daily with meals. Per sliding scale. MAX DAILY DOSE 90U   Insulin Syringes, Disposable, (B-D INSULIN SYRINGE 1CC) U-100 1 ML MISC Use as directed to inject insulin 5x daily. Dx:E11.65.   JARDIANCE 25 MG TABS tablet TAKE 1 TABLET BY MOUTH EVERY DAY BEFORE BREAKFAST   lisinopril (ZESTRIL) 20 MG tablet TAKE 2 TABLETS (40 MG TOTAL) BY MOUTH DAILY.   metFORMIN (GLUCOPHAGE) 500 MG tablet TAKE 2 TABLETS BY MOUTH TWICE A DAY    metoprolol succinate (TOPROL-XL) 25 MG 24 hr tablet TAKE 1 TABLET BY MOUTH EVERY DAY   omega-3 acid ethyl esters (LOVAZA) 1 g capsule TAKE 2 CAPSULES BY MOUTH TWICE A DAY   pioglitazone (ACTOS) 30 MG tablet TAKE 1 TABLET BY MOUTH EVERY DAY BEFORE BREAKFAST   pregabalin (LYRICA) 150 MG capsule TAKE 1 CAPSULE BY MOUTH TWICE A DAY   rosuvastatin (CRESTOR) 20 MG tablet TAKE 1 TABLET BY MOUTH EVERY DAY   Facility-Administered Encounter Medications as of 10/21/2021  Medication   lidocaine-EPINEPHrine (XYLOCAINE-EPINEPHrine) 1 %-1:200000 (PF) injection 10 mL   Have you had any problems recently with your health? Patient denied any recent problems or issues with his health.   Have you had any problems with your pharmacy? Patient denied any recent issues or problems with his current pharmacy.   What issues or side effects are you having with your medications? Patient denied any issues or side effects with his current medications.   What would you like me to pass along to Leata Mouse, CPP for them to help you with?  Patient did not have anything to pass along to CPP at this time. He stated he was doing well currently.   What can we do to take care of you better? Patient did not have any recommendations at this time.   Care Gaps  AWV: done 09/17/21 Colonoscopy: done 12/02/17 DM Eye Exam: due 08/02/21 DM Foot Exam: due 08/09/20 Microalbumin:  done 11/25/20 HbgAIC: done 11/25/20 (7.9) DEXA: N/A Mammogram: N/A   Star Rating Drugs: JARDIANCE 25 MG TABS tablet - last filled 08/21/21 90 days lisinopril (ZESTRIL) 20 MG tablet - last filled 08/21/21 90 days  metFORMIN (GLUCOPHAGE) 500 MG tablet - last filled 08/21/21 90 days  rosuvastatin (CRESTOR) 20 MG tablet - last filled  03/29/21 90 days    Future Appointments  Date Time Provider [place]  10/23/2021  8:00 AM Pixie Casino, MD [facility]  11/10/2021  7:15 AM [facility] DEVICE REMOTES [facility]  12/15/2021   7:15 AM [facility] DEVICE REMOTES [facility]  01/20/2022  7:05 AM [facility] DEVICE REMOTES [facility]  02/23/2022  7:15 AM [facility] DEVICE REMOTES [facility]  03/30/2022  7:15 AM [facility] DEVICE REMOTES [facility]    Jobe Gibbon, [place] Clinical Pharmacist Assistant  [phone]

## 2021-10-23 ENCOUNTER — Encounter: Payer: Self-pay | Admitting: Internal Medicine

## 2021-10-23 ENCOUNTER — Other Ambulatory Visit: Payer: Self-pay

## 2021-10-23 ENCOUNTER — Ambulatory Visit (INDEPENDENT_AMBULATORY_CARE_PROVIDER_SITE_OTHER): Payer: Medicare Other | Admitting: Internal Medicine

## 2021-10-23 VITALS — BP 128/62 | HR 54 | Ht 73.0 in | Wt 261.6 lb

## 2021-10-23 DIAGNOSIS — I10 Essential (primary) hypertension: Secondary | ICD-10-CM

## 2021-10-23 DIAGNOSIS — Z7901 Long term (current) use of anticoagulants: Secondary | ICD-10-CM

## 2021-10-23 DIAGNOSIS — R55 Syncope and collapse: Secondary | ICD-10-CM | POA: Diagnosis not present

## 2021-10-23 DIAGNOSIS — I480 Paroxysmal atrial fibrillation: Secondary | ICD-10-CM

## 2021-10-23 DIAGNOSIS — Z95818 Presence of other cardiac implants and grafts: Secondary | ICD-10-CM | POA: Diagnosis not present

## 2021-10-23 NOTE — Patient Instructions (Signed)
Medication Instructions:  ?Your physician recommends that you continue on your current medications as directed. Please refer to the Current Medication list given to you today. ? ?*If you need a refill on your cardiac medications before your next appointment, please call your pharmacy* ? ? ?Follow-Up: ?At [facility], you and your health needs are our priority.  As part of our continuing mission to provide you with exceptional heart care, we have created designated Provider Care Teams.  These Care Teams include your primary Cardiologist (physician) and Advanced Practice Providers (APPs -  Physician Assistants and Nurse Practitioners) who all work together to provide you with the care you need, when you need it. ? ?We recommend signing up for the patient portal called "MyChart".  Sign up information is provided on this After Visit Summary.  MyChart is used to connect with patients for Virtual Visits (Telemedicine).  Patients are able to view lab/test results, encounter notes, upcoming appointments, etc.  Non-urgent messages can be sent to your provider as well.   ?To learn more about what you can do with MyChart, go to [email or web address].   ? ?Your next appointment:   ? ?1 year with Dr. Debara Pickett  ?

## 2021-10-23 NOTE — Progress Notes (Signed)
? ?OFFICE NOTE ? ?Chief Complaint:  ?Recent syncope ? ?Primary Care Physician: ?[name], MD ? ?HPI:  ?Ronald Dorsey is a 71 y.o. male with a past medial history significant for paroxysmal atrial fibrillation which was seen during cardiac catheterization.  I first met him in the hospital which time he was having symptoms concerning for unstable angina.  He underwent left heart catheterization in January 2020 which showed minimal nonobstructive coronary disease however he was found to be in new onset atrial fibrillation.  Subsequently he was placed on Eliquis but had never come back to the office for follow-up.  Recently was seen in the emergency department after having had a syncopal episode.  Apparently EMS was dispatched to his house and he was somewhat confused on arrival.  His wife noted that he had had a syncopal episode with very little warning.  He was noted to be bradycardic and was given atropine with marked improvement in his symptoms.  Heart rate is in the 50s today however he said it was in the 40s apparently at that time.  There was some nausea and diaphoresis.  The episode overall sounds like it might be a vagal episode however given his history of arrhythmias, it sounds like he could also have possibly had a bradycardic event.  He also has insulin-dependent diabetes however he said blood sugar was assessed and was I believe 135 mg/dL by EMS. ? ?10/23/2021 ? ?Ronald Dorsey is seen today in follow-up.  He is doing well without any further syncopal episodes.  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  He said he has been up and down the stairs numerous times and is asymptomatic with that.  He had an implanted loop recorder placed last November and has had remote checks since then.  He has had some PVCs but no recurrent A-fib, pauses or any arrhythmias. ? ?PMHx:  ?Past Medical History:  ?Diagnosis Date  ? Apnea   ? Arthritis   ?  Diabetes mellitus without complication ([place])   ? Hypercholesterolemia   ? Hypertension   ? Neuromuscular disorder ([place])   ? sciatica  ? Paroxysmal atrial fibrillation (HCC)   ? PSVT (paroxysmal supraventricular tachycardia) ([place])   ? ? ?Past Surgical History:  ?Procedure Laterality Date  ? CARPAL TUNNEL RELEASE  2010  ? left wrist  ? EYE SURGERY    ? LEFT HEART CATH AND CORONARY ANGIOGRAPHY N/A 09/23/2018  ? Procedure: LEFT HEART CATH AND CORONARY ANGIOGRAPHY;  Surgeon: Burnell Blanks, MD;  Location: [facility];  Service: Cardiovascular;  Laterality: N/A;  ? TONSILLECTOMY  1961  ? ? ?FAMHx:  ?Family History  ?Problem Relation Age of Onset  ? Hypertension Father   ? ? ?SOCHx:  ? reports that he has quit smoking. He has quit using smokeless tobacco. He reports current alcohol use. He reports that he does not use drugs. ? ?ALLERGIES:  ?Allergies  ?Allergen Reactions  ? Eggs Or Egg-Derived Products Nausea And Vomiting  ? ? ?ROS: ?Pertinent items noted in HPI and remainder of comprehensive ROS otherwise negative. ? ?HOME MEDS: ?Current Outpatient Medications on File Prior to Visit  ?Medication Sig Dispense Refill  ? amLODipine (NORVASC) 10 MG tablet TAKE 1 TABLET BY MOUTH EVERY DAY 90 tablet 3  ? Continuous Blood Gluc Sensor (FREESTYLE LIBRE 14 DAY SENSOR) MISC Use as directed to monitor blood glucose continuously. Replace sensor Q14 days.  Dx: e11.9 2 each 11  ? ELIQUIS 5 MG TABS tablet TAKE 1 TABLET BY MOUTH TWICE A DAY 180 tablet 2  ? ezetimibe (ZETIA) 10 MG tablet TAKE 1 TABLET BY MOUTH EVERY DAY 90 tablet 3  ? hydrochlorothiazide (HYDRODIURIL) 25 MG tablet TAKE 1 TABLET BY MOUTH EVERY DAY 90 tablet 3  ? insulin glargine (LANTUS) 100 UNIT/ML injection Inject 0.5 mLs (50 Units total) into the skin 2 (two) times daily with a meal. 90 mL 3  ? insulin lispro (HUMALOG) 100 UNIT/ML injection Inject 0-0.3 mLs (0-30 Units total) into the skin 3 (three) times daily with meals. Per sliding scale. MAX DAILY DOSE  90U 110 mL 2  ? Insulin Syringes, Disposable, (B-D INSULIN SYRINGE 1CC) U-100 1 ML MISC Use as directed to inject insulin 5x daily. Dx:E11.65. 500 each 1  ? JARDIANCE 25 MG TABS tablet TAKE 1 TABLET BY MOUTH EVERY DAY BEFORE BREAKFAST 90 tablet 3  ? lisinopril (ZESTRIL) 20 MG tablet TAKE 2 TABLETS (40 MG TOTAL) BY MOUTH DAILY. 180 tablet 1  ? metFORMIN (GLUCOPHAGE) 500 MG tablet TAKE 2 TABLETS BY MOUTH TWICE A DAY 360 tablet 2  ? metoprolol succinate (TOPROL-XL) 25 MG 24 hr tablet TAKE 1 TABLET BY MOUTH EVERY DAY 90 tablet 3  ? omega-3 acid ethyl esters (LOVAZA) 1 g capsule TAKE 2 CAPSULES BY MOUTH TWICE A DAY 360 capsule 1  ? pioglitazone (ACTOS) 30 MG tablet TAKE 1 TABLET BY MOUTH EVERY DAY BEFORE BREAKFAST 90 tablet 3  ? pregabalin (LYRICA) 150 MG capsule TAKE 1 CAPSULE BY MOUTH TWICE A DAY 180 capsule 1  ? rosuvastatin (CRESTOR) 20 MG tablet TAKE 1 TABLET BY MOUTH EVERY DAY 90 tablet 3  ? ?Current Facility-Administered Medications on File Prior to Visit  ?Medication Dose Route Frequency Provider Last Rate Last Admin  ? lidocaine-EPINEPHrine (XYLOCAINE-EPINEPHrine) 1 %-1:200000 (PF) injection 10 mL  10 mL Infiltration Once Croitoru, Mihai, MD      ? ? ?LABS/IMAGING: ?No results found for this or any previous visit (from the past 48 hour(s)). ?No results found. ? ?LIPID PANEL: ?   ?Component Value Date/Time  ? CHOL 109 11/25/2020 0826  ? TRIG 122 11/25/2020 0826  ? HDL 43 11/25/2020 0826  ? CHOLHDL 2.5 11/25/2020 0826  ? VLDL 12 09/23/2018 0218  ? [place] 46 11/25/2020 0826  ? ?  ?WEIGHTS: ?Wt Readings from Last 3 Encounters:  ?10/23/21 261 lb 9.6 oz (118.7 kg)  ?04/08/21 261 lb 3.2 oz (118.5 kg)  ?04/06/21 255 lb (115.7 kg)  ? ? ?VITALS: ?BP 128/62   Pulse (!) 54   Ht '6\' 1"'  (1.854 m)   Wt 261 lb 9.6 oz (118.7 kg)   SpO2 98%   BMI 34.51 kg/m?  ? ?EXAM: ?General appearance: alert and no distress ?Neck: no carotid bruit, no JVD, and thyroid not enlarged, symmetric, no tenderness/mass/nodules ?Lungs: clear to  auscultation bilaterally ?Heart: regular rate and rhythm, S1, S2 normal, no murmur, click, rub or gallop ?Abdomen: soft, non-tender; bowel sounds normal; no masses,  no organomegaly ?Extremities: extremities normal, atraumatic, no cyanosis or edema ?Pulses: 2+ and symmetric ?Skin: Skin color, texture, turgor normal. No rashes or lesions ?Neurologic: Grossly normal ?Psych: Pleasant ? ?EKG: ?Sinus bradycardia with PACs at 54-personally reviewed ? ?ASSESSMENT: ?Syncope, status post ILR (06/2021) ?History of PAF-CHA2DS2-VASc score of 2 ?Anticoagulation on Eliquis ?Bradycardia ? ?PLAN: ?1.   Ronald Dorsey has not had any recurrent syncopal episodes.  Blood pressure is well controlled.  He is  had no bleeding issues on Eliquis.  He is very active and denies any angina or shortness of breath.  He is implanted loop recorder is failed to show any recurrent events.  We will continue to monitor that.  Plan follow-up with me annually or sooner if necessary. ? ?Pixie Casino, MD, [facility], FACP  ?[place]  ?Medical Director of the [facility]id [facility] &  ?Cardi[facility] ?Diplomate of the [employer] of Clinical Lipidology ?Attending Cardiologist  ?Direct Dial: [phone]  Fax: [phone]  ?Website:  [email or web address] ? ? ?Nadean Corwin Brandt Chaney ?10/23/2021, 8:10 AM ? ?

## 2021-11-10 ENCOUNTER — Ambulatory Visit (INDEPENDENT_AMBULATORY_CARE_PROVIDER_SITE_OTHER): Payer: Medicare Other

## 2021-11-10 DIAGNOSIS — R55 Syncope and collapse: Secondary | ICD-10-CM | POA: Diagnosis not present

## 2021-11-10 LAB — CUP PACEART REMOTE DEVICE CHECK
Date Time Interrogation Session: 20230319230445
Implantable Pulse Generator Implant Date: 20221031

## 2021-11-18 ENCOUNTER — Telehealth: Payer: Self-pay | Admitting: Pharmacist

## 2021-11-18 NOTE — Progress Notes (Signed)
? ? ?Chronic Care Management ?Pharmacy Assistant  ? ?Name: Ronald Dorsey  MRN: [id number] DOB: [birth year] ? ? ?Reason for Encounter: Disease State - General Adherence Call  ? ? ?Recent office visits:  ?None noted.  ? ?Recent consult visits:  ?Lyman Bishop MD - Cardiology - Syncope - EKG performed. No medication changes. Follow up in 1 year. ? ?Hospital visits:  ?None in previous 6 months ? ?Medications: ?Outpatient Encounter Medications as of 11/18/2021  ?Medication Sig  ? amLODipine (NORVASC) 10 MG tablet TAKE 1 TABLET BY MOUTH EVERY DAY  ? Continuous Blood Gluc Sensor (FREESTYLE LIBRE 14 DAY SENSOR) MISC Use as directed to monitor blood glucose continuously. Replace sensor Q14 days. Dx: e11.9  ? ELIQUIS 5 MG TABS tablet TAKE 1 TABLET BY MOUTH TWICE A DAY  ? ezetimibe (ZETIA) 10 MG tablet TAKE 1 TABLET BY MOUTH EVERY DAY  ? hydrochlorothiazide (HYDRODIURIL) 25 MG tablet TAKE 1 TABLET BY MOUTH EVERY DAY  ? insulin glargine (LANTUS) 100 UNIT/ML injection Inject 0.5 mLs (50 Units total) into the skin 2 (two) times daily with a meal.  ? insulin lispro (HUMALOG) 100 UNIT/ML injection Inject 0-0.3 mLs (0-30 Units total) into the skin 3 (three) times daily with meals. Per sliding scale. MAX DAILY DOSE 90U  ? Insulin Syringes, Disposable, (B-D INSULIN SYRINGE 1CC) U-100 1 ML MISC Use as directed to inject insulin 5x daily. Dx:E11.65.  ? JARDIANCE 25 MG TABS tablet TAKE 1 TABLET BY MOUTH EVERY DAY BEFORE BREAKFAST  ? lisinopril (ZESTRIL) 20 MG tablet TAKE 2 TABLETS (40 MG TOTAL) BY MOUTH DAILY.  ? metFORMIN (GLUCOPHAGE) 500 MG tablet TAKE 2 TABLETS BY MOUTH TWICE A DAY  ? metoprolol succinate (TOPROL-XL) 25 MG 24 hr tablet TAKE 1 TABLET BY MOUTH EVERY DAY  ? omega-3 acid ethyl esters (LOVAZA) 1 g capsule TAKE 2 CAPSULES BY MOUTH TWICE A DAY  ? pioglitazone (ACTOS) 30 MG tablet TAKE 1 TABLET BY MOUTH EVERY DAY BEFORE BREAKFAST  ? pregabalin (LYRICA) 150 MG capsule TAKE 1 CAPSULE BY MOUTH TWICE A DAY  ? rosuvastatin  (CRESTOR) 20 MG tablet TAKE 1 TABLET BY MOUTH EVERY DAY  ? ?Facility-Administered Encounter Medications as of 11/18/2021  ?Medication  ? lidocaine-EPINEPHrine (XYLOCAINE-EPINEPHrine) 1 %-1:200000 (PF) injection 10 mL  ? ? ?Have you had any problems recently with your health? ? ? ?Have you had any problems with your pharmacy? ? ? ?What issues or side effects are you having with your medications? ? ? ?What would you like me to pass along to Leata Mouse, CPP for them to help you with?  ? ? ?What can we do to take care of you better? ? ? ?Care Gaps ? ?AWV: done 09/17/21 ?Colonoscopy: done 12/02/12 ?DM Eye Exam: due 08/02/21 ?DM Foot Exam: due 08/09/20 ?Microalbumin: done 11/25/20 ?HbgAIC: done 11/25/20 (7.9) ?DEXA: N/A  ?Mammogram: N/A ? ? ?Star Rating Drugs: ?rosuvastatin (CRESTOR) 20 MG tablet - last filled 03/29/21 90 days  ?pioglitazone (ACTOS) 30 MG tablet - last filled 09/27/21 90 days  ?metFORMIN (GLUCOPHAGE) 500 MG tablet - last filled 08/21/21 90 days  ?lisinopril (ZESTRIL) 20 MG tablet - last filled 08/21/21 90 days  ?JARDIANCE 25 MG TABS tablet - last filled 08/21/21 90 days  ? ? ?Future Appointments  ?Date Time Provider [place]  ?12/15/2021  7:15 AM [facility] DEVICE REMOTES [facility]  ?01/20/2022  7:05 AM [facility] DEVICE REMOTES [facility]  ?02/23/2022  7:15 AM [facility] DEVICE REMOTES [facility]  ?03/30/2022  7:15 AM [facility]  DEVICE REMOTES [facility]  ? ?Multiple attempts were made to contact patient. Attempts were unsuccessful. / ls,CMA  ? ?Liza Showfety, CCMA ?Clinical Pharmacist Assistant  ?([phone] ? ?

## 2021-11-19 NOTE — Progress Notes (Signed)
Carelink Summary Report / Loop Recorder 

## 2021-12-04 ENCOUNTER — Other Ambulatory Visit: Payer: Self-pay | Admitting: Family Medicine

## 2021-12-15 ENCOUNTER — Ambulatory Visit (INDEPENDENT_AMBULATORY_CARE_PROVIDER_SITE_OTHER): Payer: Medicare Other

## 2021-12-15 DIAGNOSIS — R55 Syncope and collapse: Secondary | ICD-10-CM

## 2021-12-15 LAB — CUP PACEART REMOTE DEVICE CHECK
Date Time Interrogation Session: 20230421230412
Implantable Pulse Generator Implant Date: 20221031

## 2021-12-30 NOTE — Progress Notes (Signed)
Carelink Summary Report / Loop Recorder 

## 2022-01-17 LAB — CUP PACEART REMOTE DEVICE CHECK
Date Time Interrogation Session: 20230524231623
Implantable Pulse Generator Implant Date: 20221031

## 2022-01-20 ENCOUNTER — Ambulatory Visit (INDEPENDENT_AMBULATORY_CARE_PROVIDER_SITE_OTHER): Payer: Medicare Other

## 2022-01-20 DIAGNOSIS — R55 Syncope and collapse: Secondary | ICD-10-CM

## 2022-02-04 ENCOUNTER — Telehealth: Payer: Self-pay

## 2022-02-04 NOTE — Telephone Encounter (Signed)
Called and lvm for pt call us back to make an appt to follow up with Dr Dennard Schaumann, [employer] is request office notice , pt hasn't been seen in over year . Pt need as CPE  with labs.Let detail vm explaining this to patient .

## 2022-02-05 NOTE — Progress Notes (Signed)
Carelink Summary Report / Loop Recorder 

## 2022-02-17 ENCOUNTER — Ambulatory Visit (INDEPENDENT_AMBULATORY_CARE_PROVIDER_SITE_OTHER): Payer: Medicare Other | Admitting: Family Medicine

## 2022-02-17 VITALS — BP 120/58 | HR 58 | Temp 97.8°F | Ht 73.0 in | Wt 258.0 lb

## 2022-02-17 DIAGNOSIS — I480 Paroxysmal atrial fibrillation: Secondary | ICD-10-CM

## 2022-02-17 DIAGNOSIS — I10 Essential (primary) hypertension: Secondary | ICD-10-CM | POA: Diagnosis not present

## 2022-02-17 DIAGNOSIS — Z794 Long term (current) use of insulin: Secondary | ICD-10-CM

## 2022-02-17 DIAGNOSIS — N400 Enlarged prostate without lower urinary tract symptoms: Secondary | ICD-10-CM

## 2022-02-17 DIAGNOSIS — E119 Type 2 diabetes mellitus without complications: Secondary | ICD-10-CM

## 2022-02-17 DIAGNOSIS — E785 Hyperlipidemia, unspecified: Secondary | ICD-10-CM | POA: Diagnosis not present

## 2022-02-17 DIAGNOSIS — Z125 Encounter for screening for malignant neoplasm of prostate: Secondary | ICD-10-CM | POA: Diagnosis not present

## 2022-02-18 LAB — COMPLETE METABOLIC PANEL WITH GFR
AG Ratio: 2 (calc) (ref 1.0–2.5)
ALT: 21 U/L (ref 9–46)
AST: 24 U/L (ref 10–35)
Albumin: 4.7 g/dL (ref 3.6–5.1)
Alkaline phosphatase (APISO): 55 U/L (ref 35–144)
BUN/Creatinine Ratio: 19 (calc) (ref 6–22)
BUN: 27 mg/dL — ABNORMAL HIGH (ref 7–25)
CO2: 26 mmol/L (ref 20–32)
Calcium: 10.5 mg/dL — ABNORMAL HIGH (ref 8.6–10.3)
Chloride: 101 mmol/L (ref 98–110)
Creat: 1.42 mg/dL — ABNORMAL HIGH (ref 0.70–1.28)
Globulin: 2.4 g/dL (calc) (ref 1.9–3.7)
Glucose, Bld: 114 mg/dL — ABNORMAL HIGH (ref 65–99)
Potassium: 4 mmol/L (ref 3.5–5.3)
Sodium: 140 mmol/L (ref 135–146)
Total Bilirubin: 0.6 mg/dL (ref 0.2–1.2)
Total Protein: 7.1 g/dL (ref 6.1–8.1)
eGFR: 53 mL/min/{1.73_m2} — ABNORMAL LOW (ref >=60)

## 2022-02-18 LAB — LIPID PANEL
Cholesterol: 105 mg/dL (ref <200)
HDL: 46 mg/dL (ref >=40)
LDL Cholesterol (Calc): 37 mg/dL (calc)
Non-HDL Cholesterol (Calc): 59 mg/dL (calc) (ref <130)
Total CHOL/HDL Ratio: 2.3 (calc) (ref <5.0)
Triglycerides: 137 mg/dL (ref <150)

## 2022-02-18 LAB — CBC WITH DIFFERENTIAL/PLATELET
Absolute Monocytes: 942 cells/uL (ref 200–950)
Basophils Absolute: 84 cells/uL (ref 0–200)
Basophils Relative: 1.1 %
Eosinophils Absolute: 251 cells/uL (ref 15–500)
Eosinophils Relative: 3.3 %
HCT: 49 % (ref 38.5–50.0)
Hemoglobin: 16.3 g/dL (ref 13.2–17.1)
Lymphs Abs: 1999 cells/uL (ref 850–3900)
MCH: 29.9 pg (ref 27.0–33.0)
MCHC: 33.3 g/dL (ref 32.0–36.0)
MCV: 89.9 fL (ref 80.0–100.0)
MPV: 10.9 fL (ref 7.5–12.5)
Monocytes Relative: 12.4 %
Neutro Abs: 4324 cells/uL (ref 1500–7800)
Neutrophils Relative %: 56.9 %
Platelets: 186 10*3/uL (ref 140–400)
RBC: 5.45 10*6/uL (ref 4.20–5.80)
RDW: 13.5 % (ref 11.0–15.0)
Total Lymphocyte: 26.3 %
WBC: 7.6 10*3/uL (ref 3.8–10.8)

## 2022-02-18 LAB — MICROALBUMIN, URINE: Microalb, Ur: 0.7 mg/dL

## 2022-02-18 LAB — HEMOGLOBIN A1C
Hgb A1c MFr Bld: 6.9 % of total Hgb — ABNORMAL HIGH (ref <5.7)
Mean Plasma Glucose: 151 mg/dL
eAG (mmol/L): 8.4 mmol/L

## 2022-02-18 LAB — PSA: PSA: 3.68 ng/mL (ref <=4.00)

## 2022-02-23 ENCOUNTER — Ambulatory Visit (INDEPENDENT_AMBULATORY_CARE_PROVIDER_SITE_OTHER): Payer: Medicare Other

## 2022-02-23 DIAGNOSIS — R55 Syncope and collapse: Secondary | ICD-10-CM | POA: Diagnosis not present

## 2022-02-24 LAB — CUP PACEART REMOTE DEVICE CHECK
Date Time Interrogation Session: 20230702230657
Implantable Pulse Generator Implant Date: 20221031

## 2022-03-05 ENCOUNTER — Telehealth: Payer: Self-pay | Admitting: Pharmacist

## 2022-03-05 NOTE — Progress Notes (Signed)
Chronic Care Management Pharmacy Assistant   Name: Ronald Dorsey  MRN: [id number] DOB: 1951/05/30   Reason for Encounter: Diabetes Adherence Call    Recent office visits:  02/17/2022 [facility] (PCP) [name], MD; no medication changes indicated.  Recent consult visits:  None  Hospital visits:  None in previous 6 months  Medications: Outpatient Encounter Medications as of 03/05/2022  Medication Sig   amLODipine (NORVASC) 10 MG tablet TAKE 1 TABLET BY MOUTH EVERY DAY   Continuous Blood Gluc Sensor (FREESTYLE LIBRE 14 DAY SENSOR) MISC Use as directed to monitor blood glucose continuously. Replace sensor Q14 days. Dx: e11.9   ELIQUIS 5 MG TABS tablet TAKE 1 TABLET BY MOUTH TWICE A DAY   ezetimibe (ZETIA) 10 MG tablet TAKE 1 TABLET BY MOUTH EVERY DAY   hydrochlorothiazide (HYDRODIURIL) 25 MG tablet TAKE 1 TABLET BY MOUTH EVERY DAY   insulin lispro (HUMALOG) 100 UNIT/ML injection Inject 0-0.3 mLs (0-30 Units total) into the skin 3 (three) times daily with meals. Per sliding scale. MAX DAILY DOSE 90U   Insulin Syringes, Disposable, (B-D INSULIN SYRINGE 1CC) U-100 1 ML MISC Use as directed to inject insulin 5x daily. Dx:E11.65.   JARDIANCE 25 MG TABS tablet TAKE 1 TABLET BY MOUTH EVERY DAY BEFORE BREAKFAST   LANTUS 100 UNIT/ML injection INJECT 50 UNITS INTO THE SKIN 2 TIMES A DAY WITH A MEAL   lisinopril (ZESTRIL) 20 MG tablet TAKE 2 TABLETS (40 MG TOTAL) BY MOUTH DAILY.   metFORMIN (GLUCOPHAGE) 500 MG tablet TAKE 2 TABLETS BY MOUTH TWICE A DAY   metoprolol succinate (TOPROL-XL) 25 MG 24 hr tablet TAKE 1 TABLET BY MOUTH EVERY DAY   omega-3 acid ethyl esters (LOVAZA) 1 g capsule TAKE 2 CAPSULES BY MOUTH TWICE A DAY   pioglitazone (ACTOS) 30 MG tablet TAKE 1 TABLET BY MOUTH EVERY DAY BEFORE BREAKFAST   pregabalin (LYRICA) 150 MG capsule TAKE 1 CAPSULE BY MOUTH TWICE A DAY   rosuvastatin (CRESTOR) 20 MG tablet TAKE 1 TABLET BY MOUTH EVERY DAY   Facility-Administered Encounter  Medications as of 03/05/2022  Medication   lidocaine-EPINEPHrine (XYLOCAINE-EPINEPHrine) 1 %-1:200000 (PF) injection 10 mL   Recent Relevant Labs: Lab Results  Component Value Date/Time   HGBA1C 6.9 (H) 02/17/2022 08:32 AM   HGBA1C 7.9 (H) 11/25/2020 08:26 AM   MICROALBUR 0.7 02/17/2022 08:32 AM   MICROALBUR 1.6 11/25/2020 08:26 AM    Kidney Function Lab Results  Component Value Date/Time   CREATININE 1.42 (H) 02/17/2022 08:32 AM   CREATININE 0.97 04/06/2021 05:19 PM   CREATININE 1.00 11/25/2020 08:26 AM   GFRNONAA >60 04/06/2021 05:19 PM   GFRNONAA 76 11/25/2020 08:26 AM   GFRAA 89 11/25/2020 08:26 AM    Current antihyperglycemic regimen:  Humalog up to 30 units tid Lantus 50 units twice daily Metformin 1000 mg twice daily Pioglitazone 30 mg daily  What recent interventions/DTPs have been made to improve glycemic control:  No recent interventions or DTPs.  Have there been any recent hospitalizations or ED visits since last visit with CPP? No  Patient denies hypoglycemic symptoms.  Patient denies hyperglycemic symptoms.  How often are you checking your blood sugar? 3-4 times daily  What are your blood sugars ranging?  Fasting: 100, 110  During the week, how often does your blood glucose drop below 70? Never  Are you checking your feet daily/regularly? Yes  Adherence Review: Is the patient currently on a STATIN medication? Yes Is the patient currently on ACE/ARB medication?  Yes Does the patient have >5 day gap between last estimated fill dates? No  Care Gaps: Medicare Annual Wellness: Completed 09/17/2021 Ophthalmology Exam: Overdue since 08/02/2021 Foot Exam: Next due on 02/18/2023 Hemoglobin A1C: 6.9% on 02/17/2022 Colonoscopy: Completed 12/02/2012  Future Appointments  Date Time Provider [place]  03/30/2022  7:15 AM [facility] DEVICE REMOTES [facility]   Star Rating Drugs: Jardiance 25 mg last filled 02/27/2022 90 DS Humalog last  filled 02/07/2022 89 DS Lisinopril 20 mg last filled 12/06/2021 90 DS Metformin 500 mg last filled 12/06/2021 90 DS Pioglitazone 30 mg last filled 12/14/2021 90 DS Rosuvastatin 20 mg last filled 12/29/2021 90 DS  April D Calhoun, [place] Pharmacist Assistant [phone]

## 2022-03-13 ENCOUNTER — Other Ambulatory Visit: Payer: Self-pay | Admitting: Family Medicine

## 2022-03-13 NOTE — Telephone Encounter (Signed)
Requested Prescriptions  Pending Prescriptions Disp Refills  . metFORMIN (GLUCOPHAGE) 500 MG tablet [Pharmacy Med Name: METFORMIN HCL 500 MG TABLET] 360 tablet 2    Sig: TAKE 2 TABLETS BY MOUTH TWICE A DAY     Endocrinology:  Diabetes - Biguanides Failed - 03/13/2022  2:03 AM      Failed - Cr in normal range and within 360 days    Creat  Date Value Ref Range Status  02/17/2022 1.42 (H) 0.70 - 1.28 mg/dL Final         Failed - eGFR in normal range and within 360 days    GFR, Est African American  Date Value Ref Range Status  11/25/2020 89 > OR = 60 mL/min/1.25m Final   GFR, Est Non African American  Date Value Ref Range Status  11/25/2020 76 > OR = 60 mL/min/1.728mFinal   GFR, Estimated  Date Value Ref Range Status  04/06/2021 >60 >60 mL/min Final    Comment:    (NOTE) Calculated using the CKD-EPI Creatinine Equation (2021)    eGFR  Date Value Ref Range Status  02/17/2022 53 (L) > OR = 60 mL/min/1.7366minal    Comment:    The eGFR is based on the CKD-EPI 2021 equation. To calculate  the new eGFR from a previous Creatinine or Cystatin C result, go to https://www.kidney.org/professionals/ kdoqi/gfr%5Fcalculator          Failed - B12 Level in normal range and within 720 days    No results found for: "VITAMINB12"       Failed - Valid encounter within last 6 months    Recent Outpatient Visits          1 year ago Diabetes mellitus, type II, insulin dependent ([facility], [place]   1 year ago Diabetes mellitus, type II, insulin dependent ([facility] [facility], [place] Mcge[facility]   2 years ago Diabetes mellitus, type II, insulin dependent ([facility], [place] Mcgee[place]   2 years ago Renal insufficiency   [facility] Fri[facility]   2 years ago Diabetes mellitus, type II, insulin dependent [facility], War[facility]ammie McgeeD              Passed - HBA1C is between 0 and 7.9 and within 180 days    Hgb A1c MFr Bld  Date Value Ref Range Status  02/17/2022 6.9 (H) <5.7 % of total Hgb Final    Comment:    For someone without known diabetes, a hemoglobin A1c value of 6.5% or greater indicates that they may have  diabetes and this should be confirmed with a follow-up  test. . For someone with known diabetes, a value <7% indicates  that their diabetes is well controlled and a value  greater than or equal to 7% indicates suboptimal  control. A1c targets should be individualized based on  duration of diabetes, age, comorbid conditions, and  other considerations. . Currently, no consensus exists regarding use of hemoglobin A1c for diagnosis of diabetes for children. .          Passed - CBC within normal limits and completed in the last 12 months    WBC  Date Value Ref Range Status  02/17/2022 7.6 3.8 - 10.8 Thousand/uL Final   RBC  Date Value Ref Range Status  02/17/2022 5.45 4.20 - 5.80 Million/uL  Final   Hemoglobin  Date Value Ref Range Status  02/17/2022 16.3 13.2 - 17.1 g/dL Final   HCT  Date Value Ref Range Status  02/17/2022 49.0 38.5 - 50.0 % Final   MCHC  Date Value Ref Range Status  02/17/2022 33.3 32.0 - 36.0 g/dL Final   [facility]  Date Value Ref Range Status  02/17/2022 29.9 27.0 - 33.0 pg Final   MCV  Date Value Ref Range Status  02/17/2022 89.9 80.0 - 100.0 fL Final   No results found for: "PLTCOUNTKUC", "LABPLAT", "POCPLA" RDW  Date Value Ref Range Status  02/17/2022 13.5 11.0 - 15.0 % Final         . lisinopril (ZESTRIL) 20 MG tablet [Pharmacy Med Name: LISINOPRIL 20 MG TABLET] 180 tablet 1    Sig: TAKE 2 TABLETS (40 MG TOTAL) BY MOUTH DAILY.     Cardiovascular:  ACE Inhibitors Failed - 03/13/2022  2:03 AM      Failed - Cr in normal range and within 180 days    Creat  Date Value Ref Range Status  02/17/2022 1.42 (H) 0.70 - 1.28 mg/dL Final         Failed - Valid encounter within  last 6 months    Recent Outpatient Visits          1 year ago Diabetes mellitus, type II, insulin dependent ([place])   [place] Pickard, Cammie Mcgee, MD   1 year ago Diabetes mellitus, type II, insulin dependent ([place])   [place] [name], MD   2 years ago Diabetes mellitus, type II, insulin dependent ([place])   [place] Pickard, Cammie Mcgee, MD   2 years ago Renal insufficiency   [place] [name], MD   2 years ago Diabetes mellitus, type II, insulin dependent ([place])   [place] Pickard, Cammie Mcgee, MD             Passed - K in normal range and within 180 days    Potassium  Date Value Ref Range Status  02/17/2022 4.0 3.5 - 5.3 mmol/L Final         Passed - Patient is not pregnant      Passed - Last BP in normal range    BP Readings from Last 1 Encounters:  02/17/22 (!) 120/58

## 2022-03-16 ENCOUNTER — Other Ambulatory Visit: Payer: Self-pay

## 2022-03-16 NOTE — Telephone Encounter (Signed)
Pharmacy faxed a refill request for ELIQUIS 5 MG TABS tablet [[id number]]    Order Details Dose, Route, Frequency: As Directed  Dispense Quantity: 60 tablet Refills: 0        Sig: TAKE 1 TABLET BY MOUTH TWICE A DAY       Start Date: 12/04/21 End Date: --  Written Date: 12/04/21 Expiration Date: 12/04/22

## 2022-03-17 MED ORDER — APIXABAN 5 MG PO TABS: 5.0000 mg | ORAL_TABLET | Freq: Two times a day (BID) | ORAL | 5 refills | Status: DC

## 2022-03-17 NOTE — Telephone Encounter (Signed)
Requested Prescriptions  Pending Prescriptions Disp Refills  . apixaban (ELIQUIS) 5 MG TABS tablet 60 tablet 5    Sig: Take 1 tablet (5 mg total) by mouth 2 (two) times daily.     Hematology:  Anticoagulants - apixaban Failed - 03/16/2022 11:53 AM      Failed - Cr in normal range and within 360 days    Creat  Date Value Ref Range Status  02/17/2022 1.42 (H) 0.70 - 1.28 mg/dL Final         Failed - Valid encounter within last 12 months    Recent Outpatient Visits          1 year ago Diabetes mellitus, type II, insulin dependent ([place])   [place] Pickard, Cammie Mcgee, MD   1 year ago Diabetes mellitus, type II, insulin dependent ([place])   [place] Pickard, Cammie Mcgee, MD   2 years ago Diabetes mellitus, type II, insulin dependent ([place])   [place] Pickard, Cammie Mcgee, MD   2 years ago Renal insufficiency   [place] [name], MD   2 years ago Diabetes mellitus, type II, insulin dependent ([place])   [place] Pickard, Cammie Mcgee, MD             Passed - PLT in normal range and within 360 days    Platelets  Date Value Ref Range Status  02/17/2022 186 140 - 400 Thousand/uL Final         Passed - HGB in normal range and within 360 days    Hemoglobin  Date Value Ref Range Status  02/17/2022 16.3 13.2 - 17.1 g/dL Final         Passed - HCT in normal range and within 360 days    HCT  Date Value Ref Range Status  02/17/2022 49.0 38.5 - 50.0 % Final         Passed - AST in normal range and within 360 days    AST  Date Value Ref Range Status  02/17/2022 24 10 - 35 U/L Final         Passed - ALT in normal range and within 360 days    ALT  Date Value Ref Range Status  02/17/2022 21 9 - 46 U/L Final

## 2022-03-19 NOTE — Progress Notes (Signed)
Carelink Summary Report / Loop Recorder 

## 2022-03-30 ENCOUNTER — Ambulatory Visit (INDEPENDENT_AMBULATORY_CARE_PROVIDER_SITE_OTHER): Payer: Medicare Other

## 2022-03-30 DIAGNOSIS — R55 Syncope and collapse: Secondary | ICD-10-CM | POA: Diagnosis not present

## 2022-03-30 LAB — CUP PACEART REMOTE DEVICE CHECK
Date Time Interrogation Session: 20230804230951
Implantable Pulse Generator Implant Date: 20221031

## 2022-04-03 ENCOUNTER — Other Ambulatory Visit: Payer: Self-pay | Admitting: Family Medicine

## 2022-04-03 NOTE — Telephone Encounter (Signed)
Requested medication (s) are due for refill today: yes  Requested medication (s) are on the active medication list: yes  Last refill:  08/22/21  Future visit scheduled: no, LOV 02/19/22  Notes to clinic:  Unable to refill per protocol, medication not assigned to the refill protocol.      Requested Prescriptions  Pending Prescriptions Disp Refills   Insulin Syringes, Disposable, (B-D INSULIN SYRINGE 1CC) U-100 1 ML MISC 500 each 1    Sig: Use as directed to inject insulin 5x daily. Dx:E11.65.     There is no refill protocol information for this order

## 2022-04-03 NOTE — Telephone Encounter (Signed)
Pharmacy faxed a refill request for Insulin Syringes, Disposable, (B-D INSULIN SYRINGE 1CC) U-100 1 ML MISC [[id number]]    Order Details Dose, Route, Frequency: As Directed  Dispense Quantity: 500 each Refills: 1        Sig: Use as directed to inject insulin 5x daily. Dx:E11.65.       Start Date: 08/22/21 End Date: --  Written Date: 08/22/21 Expiration Date: 08/22/22

## 2022-04-06 MED ORDER — BD INSULIN SYRINGE U-100 1 ML MISC: 12 refills | Status: DC

## 2022-04-15 ENCOUNTER — Other Ambulatory Visit: Payer: Self-pay | Admitting: Family Medicine

## 2022-04-29 NOTE — Progress Notes (Signed)
Carelink Summary Report / Loop Recorder 

## 2022-04-30 ENCOUNTER — Encounter: Payer: Self-pay | Admitting: Family Medicine

## 2022-05-04 ENCOUNTER — Ambulatory Visit (INDEPENDENT_AMBULATORY_CARE_PROVIDER_SITE_OTHER): Payer: Medicare Other

## 2022-05-04 DIAGNOSIS — R55 Syncope and collapse: Secondary | ICD-10-CM

## 2022-05-05 LAB — CUP PACEART REMOTE DEVICE CHECK
Date Time Interrogation Session: 20230906230825
Implantable Pulse Generator Implant Date: 20221031

## 2022-05-09 ENCOUNTER — Other Ambulatory Visit: Payer: Self-pay | Admitting: Family Medicine

## 2022-05-11 MED ORDER — INSULIN GLARGINE 100 UNIT/ML ~~LOC~~ SOLN: 50.0000 [IU] | Freq: Two times a day (BID) | SUBCUTANEOUS | 0 refills | Status: DC

## 2022-05-21 NOTE — Progress Notes (Signed)
Carelink Summary Report / Loop Recorder 

## 2022-05-26 ENCOUNTER — Other Ambulatory Visit: Payer: Self-pay | Admitting: Family Medicine

## 2022-05-27 NOTE — Telephone Encounter (Signed)
Requested Prescriptions  Pending Prescriptions Disp Refills  . hydrochlorothiazide (HYDRODIURIL) 25 MG tablet [Pharmacy Med Name: HYDROCHLOROTHIAZIDE 25 MG TAB] 90 tablet     Sig: TAKE 1 TABLET BY MOUTH EVERY DAY     Cardiovascular: Diuretics - Thiazide Failed - 05/26/2022  8:35 PM      Failed - Cr in normal range and within 180 days    Creat  Date Value Ref Range Status  02/17/2022 1.42 (H) 0.70 - 1.28 mg/dL Final         Failed - Valid encounter within last 6 months    Recent Outpatient Visits          1 year ago Diabetes mellitus, type II, insulin dependent ([place])   [place] [name], MD   2 years ago Diabetes mellitus, type II, insulin dependent ([place])   [place] [name], MD   2 years ago Diabetes mellitus, type II, insulin dependent ([place])   [place] Pickard, Cammie Mcgee, MD   2 years ago Renal insufficiency   [place] [name], MD   2 years ago Diabetes mellitus, type II, insulin dependent ([place])   [place] Pickard, Cammie Mcgee, MD             Passed - K in normal range and within 180 days    Potassium  Date Value Ref Range Status  02/17/2022 4.0 3.5 - 5.3 mmol/L Final         Passed - Na in normal range and within 180 days    Sodium  Date Value Ref Range Status  02/17/2022 140 135 - 146 mmol/L Final         Passed - Last BP in normal range    BP Readings from Last 1 Encounters:  02/17/22 (!) 120/58         . JARDIANCE 25 MG TABS tablet [Pharmacy Med Name: JARDIANCE 25 MG TABLET] 90 tablet     Sig: TAKE 1 TABLET BY MOUTH EVERY DAY BEFORE BREAKFAST     Endocrinology:  Diabetes - SGLT2 Inhibitors Failed - 05/26/2022  8:35 PM      Failed - Cr in normal range and within 360 days    Creat  Date Value Ref Range Status  02/17/2022 1.42 (H) 0.70 - 1.28 mg/dL Final         Failed - eGFR in normal range and within 360 days    GFR, Est African  American  Date Value Ref Range Status  11/25/2020 89 > OR = 60 mL/min/1.35m Final   GFR, Est Non African American  Date Value Ref Range Status  11/25/2020 76 > OR = 60 mL/min/1.769mFinal   GFR, Estimated  Date Value Ref Range Status  04/06/2021 >60 >60 mL/min Final    Comment:    (NOTE) Calculated using the CKD-EPI Creatinine Equation (2021)    eGFR  Date Value Ref Range Status  02/17/2022 53 (L) > OR = 60 mL/min/1.7366minal    Comment:    The eGFR is based on the CKD-EPI 2021 equation. To calculate  the new eGFR from a previous Creatinine or Cystatin C result, go to https://www.kidney.org/professionals/ kdoqi/gfr%5Fcalculator          Failed - Valid encounter within last 6 months    Recent Outpatient Visits          1 year ago Diabetes mellitus, type II, insulin dependent ([place]  [place] Pickard, Cammie Mcgee, MD   2 years ago Diabetes mellitus, type II, insulin dependent ([place])   [place] Pickard, Cammie Mcgee, MD   2 years ago Diabetes mellitus, type II, insulin dependent ([place])   [place] Pickard, Cammie Mcgee, MD   2 years ago Renal insufficiency   [place] [name], MD   2 years ago Diabetes mellitus, type II, insulin dependent ([place])   [facility] Pickard, Cammie Mcgee, MD             Passed - HBA1C is between 0 and 7.9 and within 180 days    Hgb A1c MFr Bld  Date Value Ref Range Status  02/17/2022 6.9 (H) <5.7 % of total Hgb Final    Comment:    For someone without known diabetes, a hemoglobin A1c value of 6.5% or greater indicates that they may have  diabetes and this should be confirmed with a follow-up  test. . For someone with known diabetes, a value <7% indicates  that their diabetes is well controlled and a value  greater than or equal to 7% indicates suboptimal  control. A1c targets should be individualized based on  duration of diabetes, age, comorbid  conditions, and  other considerations. . Currently, no consensus exists regarding use of hemoglobin A1c for diagnosis of diabetes for children. .          . metoprolol succinate (TOPROL-XL) 25 MG 24 hr tablet [Pharmacy Med Name: METOPROLOL SUCC ER 25 MG TAB] 90 tablet 3    Sig: TAKE 1 TABLET BY MOUTH EVERY DAY     Cardiovascular:  Beta Blockers Failed - 05/26/2022  8:35 PM      Failed - Valid encounter within last 6 months    Recent Outpatient Visits          1 year ago Diabetes mellitus, type II, insulin dependent ([place])   [place] [name], MD   2 years ago Diabetes mellitus, type II, insulin dependent ([place])   [place] [name], MD   2 years ago Diabetes mellitus, type II, insulin dependent ([place])   [place] Pickard, Cammie Mcgee, MD   2 years ago Renal insufficiency   [place] [name], MD   2 years ago Diabetes mellitus, type II, insulin dependent ([place])   [place] Pickard, Cammie Mcgee, MD             Passed - Last BP in normal range    BP Readings from Last 1 Encounters:  02/17/22 (!) 120/58         Passed - Last Heart Rate in normal range    Pulse Readings from Last 1 Encounters:  02/17/22 (!) 58         . ezetimibe (ZETIA) 10 MG tablet [Pharmacy Med Name: EZETIMIBE 10 MG TABLET] 90 tablet 3    Sig: TAKE 1 TABLET BY MOUTH EVERY DAY     Cardiovascular:  Antilipid - Sterol Transport Inhibitors Failed - 05/26/2022  8:35 PM      Failed - Valid encounter within last 12 months    Recent Outpatient Visits          1 year ago Diabetes mellitus, type II, insulin dependent ([place])   [facility] Pickard, Cammie Mcgee, MD   2 years ago Diabetes mellitus, type II, insulin dependent (  [place])   [place] Dennard Schaumann, Cammie Mcgee, MD   2 years ago Diabetes mellitus, type II, insulin dependent ([place])   [place] Pickard,  Cammie Mcgee, MD   2 years ago Renal insufficiency   [place] [name], MD   2 years ago Diabetes mellitus, type II, insulin dependent ([place])   [place] Pickard, Cammie Mcgee, MD             Failed - Lipid Panel in normal range within the last 12 months    Cholesterol  Date Value Ref Range Status  02/17/2022 105 <200 mg/dL Final   LDL Cholesterol (Calc)  Date Value Ref Range Status  02/17/2022 37 mg/dL (calc) Final    Comment:    Reference range: <100 . Desirable range <100 mg/dL for primary prevention;   <70 mg/dL for patients with CHD or diabetic patients  with > or = 2 CHD risk factors. Marland Kitchen LDL-C is now calculated using the Martin-Hopkins  calculation, which is a validated novel method providing  better accuracy than the Friedewald equation in the  estimation of LDL-C.  Cresenciano Genre et al. Annamaria Helling. 4496;759(16): 2061-2068  (http://education.[employer].com/faq/FAQ164)    HDL  Date Value Ref Range Status  02/17/2022 46 > OR = 40 mg/dL Final   Triglycerides  Date Value Ref Range Status  02/17/2022 137 <150 mg/dL Final         Passed - AST in normal range and within 360 days    AST  Date Value Ref Range Status  02/17/2022 24 10 - 35 U/L Final         Passed - ALT in normal range and within 360 days    ALT  Date Value Ref Range Status  02/17/2022 21 9 - 46 U/L Final         Passed - Patient is not pregnant

## 2022-05-27 NOTE — Telephone Encounter (Signed)
Requested medication (s) are due for refill today: HCTZ: yes   Jardiance: no  Requested medication (s) are on the active medication list: yes  Last refill:  HCTZ: 05/21/22 #90           Jardiance: 05/27/21 #90 3 RF  Future visit scheduled: no  Notes to clinic:  pt was due to come back for lab work in July to check BMP. Did not see that BMP was ordered for future.    Requested Prescriptions  Pending Prescriptions Disp Refills   hydrochlorothiazide (HYDRODIURIL) 25 MG tablet [Pharmacy Med Name: HYDROCHLOROTHIAZIDE 25 MG TAB] 90 tablet     Sig: TAKE 1 TABLET BY MOUTH EVERY DAY     Cardiovascular: Diuretics - Thiazide Failed - 05/26/2022  8:35 PM      Failed - Cr in normal range and within 180 days    Creat  Date Value Ref Range Status  02/17/2022 1.42 (H) 0.70 - 1.28 mg/dL Final         Failed - Valid encounter within last 6 months    Recent Outpatient Visits           1 year ago Diabetes mellitus, type II, insulin dependent ([place])   [place] Pickard, Cammie Mcgee, MD   2 years ago Diabetes mellitus, type II, insulin dependent ([place])   [place] Pickard, Cammie Mcgee, MD   2 years ago Diabetes mellitus, type II, insulin dependent ([place])   [place] Pickard, Cammie Mcgee, MD   2 years ago Renal insufficiency   [place] [name], MD   2 years ago Diabetes mellitus, type II, insulin dependent ([place])   [place] Pickard, Cammie Mcgee, MD              Passed - K in normal range and within 180 days    Potassium  Date Value Ref Range Status  02/17/2022 4.0 3.5 - 5.3 mmol/L Final         Passed - Na in normal range and within 180 days    Sodium  Date Value Ref Range Status  02/17/2022 140 135 - 146 mmol/L Final         Passed - Last BP in normal range    BP Readings from Last 1 Encounters:  02/17/22 (!) 120/58          JARDIANCE 25 MG TABS tablet [Pharmacy Med Name: JARDIANCE 25 MG  TABLET] 90 tablet     Sig: TAKE 1 TABLET BY MOUTH [place]     Endocrinology:  Diabetes - SGLT2 Inhibitors Failed - 05/26/2022  8:35 PM      Failed - Cr in normal range and within 360 days    Creat  Date Value Ref Range Status  02/17/2022 1.42 (H) 0.70 - 1.28 mg/dL Final         Failed - eGFR in normal range and within 360 days    GFR, Est African American  Date Value Ref Range Status  11/25/2020 89 > OR = 60 mL/min/1.64m Final   GFR, Est Non African American  Date Value Ref Range Status  11/25/2020 76 > OR = 60 mL/min/1.773mFinal   GFR, Estimated  Date Value Ref Range Status  04/06/2021 >60 >60 mL/min Final    Comment:    (NOTE) Calculated using the CKD-EPI Creatinine Equation (2021)    eGFR  Date Value Ref Range Status  02/17/2022 53 (  L) > OR = 60 mL/min/1.13m Final    Comment:    The eGFR is based on the CKD-EPI 2021 equation. To calculate  the new eGFR from a previous Creatinine or Cystatin C result, go to https://www.kidney.org/professionals/ kdoqi/gfr%5Fcalculator          Failed - Valid encounter within last 6 months    Recent Outpatient Visits           1 year ago Diabetes mellitus, type II, insulin dependent ([place]   BKanoshPickard, WCammie Mcgee MD   2 years ago Diabetes mellitus, type II, insulin dependent ([place]   [place], WCammie Mcgee MD   2 years ago Diabetes mellitus, type II, insulin dependent ([place]   BLargoPickard, WCammie Mcgee MD   2 years ago Renal insufficiency   BDeer LodgePSusy Frizzle MD   2 years ago Diabetes mellitus, type II, insulin dependent ([place]   [facility] WCammie Mcgee MD              Passed - HBA1C is between 0 and 7.9 and within 180 days    Hgb A1c MFr Bld  Date Value Ref Range Status  02/17/2022 6.9 (H) <5.7 % of total Hgb Final    Comment:    For someone without known diabetes, a hemoglobin  A1c value of 6.5% or greater indicates that they may have  diabetes and this should be confirmed with a follow-up  test. . For someone with known diabetes, a value <7% indicates  that their diabetes is well controlled and a value  greater than or equal to 7% indicates suboptimal  control. A1c targets should be individualized based on  duration of diabetes, age, comorbid conditions, and  other considerations. . Currently, no consensus exists regarding use of hemoglobin A1c for diagnosis of diabetes for children. .          Signed Prescriptions Disp Refills   metoprolol succinate (TOPROL-XL) 25 MG 24 hr tablet 90 tablet 3    Sig: TAKE 1 TABLET BY MOUTH EVERY DAY     Cardiovascular:  Beta Blockers Failed - 05/26/2022  8:35 PM      Failed - Valid encounter within last 6 months    Recent Outpatient Visits           1 year ago Diabetes mellitus, type II, insulin dependent ([place]   BBig HornPSusy Frizzle MD   2 years ago Diabetes mellitus, type II, insulin dependent ([place]   [place]rainvillePSusy Frizzle MD   2 years ago Diabetes mellitus, type II, insulin dependent ([place]imes[place]   BGang MillsPickard, WCammie Mcgee MD   2 years ago Renal insufficiency   BLewesPSusy Frizzle MD   2 years ago Diabetes mellitus, type II, insulin dependent ([place]   BYoloPickard, WCammie Mcgee MD              Passed - Last BP in normal range    BP Readings from Last 1 Encounters:  02/17/22 (!) 120/58         Passed - Last Heart Rate in normal range    Pulse Readings from Last 1 Encounters:  02/17/22 (!) 58          ezetimibe (ZETIA) 10 MG tablet 90 tablet 3    Sig: TAKE 1 TABLET BY MOUTH  EVERY DAY     Cardiovascular:  Antilipid - Sterol Transport Inhibitors Failed - 05/26/2022  8:35 PM      Failed - Valid encounter within last 12 months    Recent Outpatient Visits           1 year ago  Diabetes mellitus, type II, insulin dependent ([place])   [place] [name], MD   2 years ago Diabetes mellitus, type II, insulin dependent ([place])   [place] [name], MD   2 years ago Diabetes mellitus, type II, insulin dependent ([place])   [place] Pickard, Cammie Mcgee, MD   2 years ago Renal insufficiency   [place] [name], MD   2 years ago Diabetes mellitus, type II, insulin dependent ([place])   [place] Pickard, Cammie Mcgee, MD              Failed - Lipid Panel in normal range within the last 12 months    Cholesterol  Date Value Ref Range Status  02/17/2022 105 <200 mg/dL Final   LDL Cholesterol (Calc)  Date Value Ref Range Status  02/17/2022 37 mg/dL (calc) Final    Comment:    Reference range: <100 . Desirable range <100 mg/dL for primary prevention;   <70 mg/dL for patients with CHD or diabetic patients  with > or = 2 CHD risk factors. Marland Kitchen LDL-C is now calculated using the Martin-Hopkins  calculation, which is a validated novel method providing  better accuracy than the Friedewald equation in the  estimation of LDL-C.  Cresenciano Genre et al. Annamaria Helling. 3220;254(27): 2061-2068  (http://education.[facility].com/faq/FAQ164)    HDL  Date Value Ref Range Status  02/17/2022 46 > OR = 40 mg/dL Final   Triglycerides  Date Value Ref Range Status  02/17/2022 137 <150 mg/dL Final         Passed - AST in normal range and within 360 days    AST  Date Value Ref Range Status  02/17/2022 24 10 - 35 U/L Final         Passed - ALT in normal range and within 360 days    ALT  Date Value Ref Range Status  02/17/2022 21 9 - 46 U/L Final         Passed - Patient is not pregnant

## 2022-05-29 ENCOUNTER — Encounter: Payer: Self-pay | Admitting: Family Medicine

## 2022-06-03 LAB — CUP PACEART REMOTE DEVICE CHECK
Date Time Interrogation Session: 20231009230552
Implantable Pulse Generator Implant Date: 20221031

## 2022-06-06 DIAGNOSIS — Z23 Encounter for immunization: Secondary | ICD-10-CM | POA: Diagnosis not present

## 2022-06-08 ENCOUNTER — Ambulatory Visit (INDEPENDENT_AMBULATORY_CARE_PROVIDER_SITE_OTHER): Payer: Medicare Other

## 2022-06-08 DIAGNOSIS — R55 Syncope and collapse: Secondary | ICD-10-CM | POA: Diagnosis not present

## 2022-06-13 ENCOUNTER — Other Ambulatory Visit: Payer: Self-pay | Admitting: Family Medicine

## 2022-06-13 DIAGNOSIS — E119 Type 2 diabetes mellitus without complications: Secondary | ICD-10-CM

## 2022-06-15 NOTE — Telephone Encounter (Signed)
Patient will need an office visit for further refills. Requested Prescriptions  Pending Prescriptions Disp Refills  . pioglitazone (ACTOS) 30 MG tablet [Pharmacy Med Name: PIOGLITAZONE HCL 30 MG TABLET] 90 tablet 0    Sig: TAKE 1 TABLET BY MOUTH EVERY DAY BEFORE BREAKFAST     Endocrinology:  Diabetes - Glitazones - pioglitazone Failed - 06/13/2022  2:26 AM      Failed - Valid encounter within last 6 months    Recent Outpatient Visits          1 year ago Diabetes mellitus, type II, insulin dependent ([place])   [place] Pickard, Cammie Mcgee, MD   2 years ago Diabetes mellitus, type II, insulin dependent ([place])   [place] Pickard, Cammie Mcgee, MD   2 years ago Diabetes mellitus, type II, insulin dependent ([place])   [place] Pickard, Cammie Mcgee, MD   2 years ago Renal insufficiency   [place] [name], MD   2 years ago Diabetes mellitus, type II, insulin dependent ([place])   [facility] Pickard, Cammie Mcgee, MD             Passed - HBA1C is between 0 and 7.9 and within 180 days    Hgb A1c MFr Bld  Date Value Ref Range Status  02/17/2022 6.9 (H) <5.7 % of total Hgb Final    Comment:    For someone without known diabetes, a hemoglobin A1c value of 6.5% or greater indicates that they may have  diabetes and this should be confirmed with a follow-up  test. . For someone with known diabetes, a value <7% indicates  that their diabetes is well controlled and a value  greater than or equal to 7% indicates suboptimal  control. A1c targets should be individualized based on  duration of diabetes, age, comorbid conditions, and  other considerations. . Currently, no consensus exists regarding use of hemoglobin A1c for diagnosis of diabetes for children. Marland Kitchen

## 2022-06-25 NOTE — Progress Notes (Signed)
Carelink Summary Report / Loop Recorder 

## 2022-06-30 ENCOUNTER — Telehealth: Payer: Self-pay | Admitting: Pharmacist

## 2022-06-30 NOTE — Progress Notes (Signed)
Chronic Care Management Pharmacy Assistant   Name: Ronald Dorsey  MRN: [id number] DOB: 13-Oct-1950   Reason for Encounter: Disease State - Diabetes Call     Recent office visits:  None noted.   Recent consult visits:  None noted.  Hospital visits:  None in previous 6 months   Medications: Outpatient Encounter Medications as of 06/30/2022  Medication Sig   amLODipine (NORVASC) 10 MG tablet TAKE 1 TABLET BY MOUTH EVERY DAY   apixaban (ELIQUIS) 5 MG TABS tablet Take 1 tablet (5 mg total) by mouth 2 (two) times daily.   Continuous Blood Gluc Sensor (FREESTYLE LIBRE 14 DAY SENSOR) MISC Use as directed to monitor blood glucose continuously. Replace sensor Q14 days. Dx: e11.9   ezetimibe (ZETIA) 10 MG tablet TAKE 1 TABLET BY MOUTH EVERY DAY   hydrochlorothiazide (HYDRODIURIL) 25 MG tablet TAKE 1 TABLET BY MOUTH EVERY DAY   insulin glargine (LANTUS) 100 UNIT/ML injection Inject 0.5 mLs (50 Units total) into the skin 2 (two) times daily.   insulin lispro (HUMALOG) 100 UNIT/ML injection Inject 0-0.3 mLs (0-30 Units total) into the skin 3 (three) times daily with meals. Per sliding scale. MAX DAILY DOSE 90U   Insulin Syringes, Disposable, (B-D INSULIN SYRINGE 1CC) U-100 1 ML MISC Use as directed to inject insulin 5x daily. Dx:E11.65.   JARDIANCE 25 MG TABS tablet TAKE 1 TABLET BY MOUTH EVERY DAY BEFORE BREAKFAST   lisinopril (ZESTRIL) 20 MG tablet TAKE 2 TABLETS (40 MG TOTAL) BY MOUTH DAILY.   metFORMIN (GLUCOPHAGE) 500 MG tablet TAKE 2 TABLETS BY MOUTH TWICE A DAY   metoprolol succinate (TOPROL-XL) 25 MG 24 hr tablet TAKE 1 TABLET BY MOUTH EVERY DAY   omega-3 acid ethyl esters (LOVAZA) 1 g capsule TAKE 2 CAPSULES BY MOUTH TWICE A DAY   pioglitazone (ACTOS) 30 MG tablet TAKE 1 TABLET BY MOUTH EVERY DAY BEFORE BREAKFAST   pregabalin (LYRICA) 150 MG capsule TAKE 1 CAPSULE BY MOUTH TWICE A DAY   rosuvastatin (CRESTOR) 20 MG tablet TAKE 1 TABLET BY MOUTH EVERY DAY   Facility-Administered  Encounter Medications as of 06/30/2022  Medication   lidocaine-EPINEPHrine (XYLOCAINE-EPINEPHrine) 1 %-1:200000 (PF) injection 10 mL    Current antihyperglycemic regimen:  Humalog up to 30 units tid Lantus 50 units twice daily Metformin 1000 mg twice daily Pioglitazone 30 mg daily  What recent interventions/DTPs have been made to improve glycemic control:  Patient denied any recent changes in medications since last visit with CPP   Have there been any recent hospitalizations or ED visits since last visit with CPP?  Patient has not had any hospitalizations or ED visits since last visit with CPP.   Patient denies hypoglycemic symptoms, including Pale, Sweaty, Shaky, Hungry, Nervous/irritable, and Vision changes   Patient denies hyperglycemic symptoms, including blurry vision, excessive thirst, fatigue, polyuria, and weakness   How often are you checking your blood sugar? Patient reported checking blood sugars continuously now and enjoying having it.    What are your blood sugars ranging?  Fasting: 130 Before meals:  After meals:  Bedtime:   During the week, how often does your blood glucose drop below 70?  Patient denied having any blood sugar readings 70 or below.   Are you checking your feet daily/regularly? Patient reports he is checking feet regularly and does not currently have any concerns.    Adherence Review: Is the patient currently on a STATIN medication? Yes Is the patient currently on ACE/ARB medication? Yes Does the patient have >  5 day gap between last estimated fill dates? No   Care Gaps: Medicare Annual Wellness: Completed 09/17/2021 Ophthalmology Exam: Overdue since 08/02/2021 Foot Exam: Next due on 02/18/2023 Hemoglobin A1C: 6.9% on 02/17/2022 Colonoscopy: Completed 12/02/2012   Star Rating Drugs: Jardiance 25 mg last filled 05/29/2022 90 DS Humalog last filled 05/07/2022 89 DS Lisinopril 20 mg last filled 05/26/2022 90 DS Metformin 500 mg last filled  05/26/2022 90 DS Pioglitazone 30 mg last filled 06/15/2022 90 DS Rosuvastatin 20 mg last filled 05/28/2022 90 DS   Future Appointments  Date Time Provider [place]  07/13/2022  7:15 AM [facility] DEVICE REMOTES [facility]  08/19/2022  7:00 AM [facility] DEVICE REMOTES [facility]  09/21/2022  7:00 AM [facility] DEVICE REMOTES [facility]  10/26/2022  7:00 AM [facility] DEVICE REMOTES [facility]  11/30/2022  7:00 AM [facility] DEVICE REMOTES [facility]     Jobe Gibbon, [place] Clinical Pharmacist Assistant  [phone]

## 2022-07-13 ENCOUNTER — Ambulatory Visit (INDEPENDENT_AMBULATORY_CARE_PROVIDER_SITE_OTHER): Payer: Medicare Other

## 2022-07-13 DIAGNOSIS — R55 Syncope and collapse: Secondary | ICD-10-CM | POA: Diagnosis not present

## 2022-07-14 LAB — CUP PACEART REMOTE DEVICE CHECK
Date Time Interrogation Session: 20231119230727
Implantable Pulse Generator Implant Date: 20221031

## 2022-07-29 ENCOUNTER — Other Ambulatory Visit: Payer: Self-pay | Admitting: Family Medicine

## 2022-08-02 ENCOUNTER — Other Ambulatory Visit: Payer: Self-pay | Admitting: Family Medicine

## 2022-08-04 DIAGNOSIS — E119 Type 2 diabetes mellitus without complications: Secondary | ICD-10-CM | POA: Diagnosis not present

## 2022-08-07 ENCOUNTER — Other Ambulatory Visit: Payer: Self-pay | Admitting: Family Medicine

## 2022-08-07 DIAGNOSIS — G709 Myoneural disorder, unspecified: Secondary | ICD-10-CM

## 2022-08-07 NOTE — Telephone Encounter (Signed)
Requested medications are due for refill today.  yes  Requested medications are on the active medications list.  yes  Last refill. 05/27/2021 #180 1 rf  Future visit scheduled.   no  Notes to clinic.  Refill not delegated.    Requested Prescriptions  Pending Prescriptions Disp Refills   pregabalin (LYRICA) 150 MG capsule [Pharmacy Med Name: PREGABALIN 150 MG CAPSULE] 180 capsule     Sig: TAKE 1 CAPSULE BY MOUTH TWICE A DAY     Not Delegated - Neurology:  Anticonvulsants - Controlled - pregabalin Failed - 08/07/2022  9:54 AM      Failed - This refill cannot be delegated      Failed - Cr in normal range and within 360 days    Creat  Date Value Ref Range Status  02/17/2022 1.42 (H) 0.70 - 1.28 mg/dL Final         Failed - Completed PHQ-2 or PHQ-9 in the last 360 days      Failed - Valid encounter within last 12 months    Recent Outpatient Visits           1 year ago Diabetes mellitus, type II, insulin dependent ([place])   [place] [name], MD   2 years ago Diabetes mellitus, type II, insulin dependent ([place])   [place] [name], MD   2 years ago Diabetes mellitus, type II, insulin dependent ([place])   [place] Pickard, Cammie Mcgee, MD   2 years ago Renal insufficiency   [place] [name], MD   2 years ago Diabetes mellitus, type II, insulin dependent ([place])   [facility] Pickard, Cammie Mcgee, MD

## 2022-08-18 ENCOUNTER — Telehealth: Payer: Self-pay

## 2022-08-18 NOTE — Telephone Encounter (Signed)
Thanks, I agree and am sharing this note with Dr. Debara Pickett as well

## 2022-08-18 NOTE — Telephone Encounter (Signed)
Received the following alert from [employer]:  ILR alert for pause, 7 sec on 08/13/22 @ 2314. Symptom event was triggered on 08/13/22 @ 2325. EGM shows SR with sinus pause, 7 sec. Routing for further review.- [place] patient to check on him.  He reports that during that night, he had a terrible 3 hours of "food poisoning". He was profusely experiencing nausea, vomiting and diarrhea. Wife witnessed event. States he did loose consciousness briefly as well at this time.   Reviewed transmission with Dr. Caryl Comes as Dr. Sallyanne Kuster is out of the office. Given patient's condition at the time, it was determined highly unlikely an electrical event would have occurred contributing to both SA NODE dysfx and AV block at the same time. More suggestive of a vagal response from the vomiting/diarrheal event.   Patient made aware and will continue to monitor.   Was also given our device clinic number if he develops any further concerning symptoms.  FYI to Dr. Sallyanne Kuster.

## 2022-08-19 ENCOUNTER — Ambulatory Visit (INDEPENDENT_AMBULATORY_CARE_PROVIDER_SITE_OTHER): Payer: Medicare Other

## 2022-08-19 DIAGNOSIS — R55 Syncope and collapse: Secondary | ICD-10-CM

## 2022-08-19 LAB — CUP PACEART REMOTE DEVICE CHECK
Date Time Interrogation Session: 20231226230152
Implantable Pulse Generator Implant Date: 20221031

## 2022-08-21 ENCOUNTER — Other Ambulatory Visit: Payer: Self-pay

## 2022-08-25 NOTE — Progress Notes (Signed)
Carelink Summary Report / Loop Recorder 

## 2022-08-26 NOTE — Patient Instructions (Signed)
[name] , Thank you for taking time to come for your Medicare Wellness Visit. I appreciate your ongoing commitment to your health goals. Please review the following plan we discussed and let me know if I can assist you in the future.   These are the goals we discussed:  Goals      Monitor and Manage My Blood Sugar-Diabetes Type 2     Timeframe:  Long-Range Goal Priority:  High Start Date:  03/27/21                           Expected End Date: 09/27/21                       Follow Up Date 07/23/21    - check blood sugar at prescribed times - check blood sugar if I feel it is too high or too low - enter blood sugar readings and medication or insulin into daily log - take the blood sugar log to all doctor visits    Why is this important?   Checking your blood sugar at home helps to keep it from getting very high or very low.  Writing the results in a diary or log helps the doctor know how to care for you.  Your blood sugar log should have the time, date and the results.  Also, write down the amount of insulin or other medicine that you take.  Other information, like what you ate, exercise done and how you were feeling, will also be helpful.     Notes:         This is a list of the screening recommended for you and due dates:  Health Maintenance  Topic Date Due   Yearly kidney health urinalysis for diabetes  Never done   Zoster (Shingles) Vaccine (1 of 2) Never done   Pneumonia Vaccine (3 - PPSV23 or PCV20) 04/05/2022   Hemoglobin A1C  08/19/2022   Colon Cancer Screening  12/03/2022   Yearly kidney function blood test for diabetes  02/18/2023   Complete foot exam   02/18/2023   Eye exam for diabetics  08/05/2023   Medicare Annual Wellness Visit  08/28/2023   DTaP/Tdap/Td vaccine (3 - Td or Tdap) 02/18/2027   Flu Shot  Completed   COVID-19 Vaccine  Completed   Hepatitis C Screening: USPSTF Recommendation to screen - Ages 18-79 yo.  Completed   HPV Vaccine  Aged Out     Advanced directives: Please bring a copy of your health care power of attorney and living will to the office to be added to your chart at your convenience.   Conditions/risks identified: Aim for 30 minutes of exercise or brisk walking, 6-8 glasses of water, and 5 servings of fruits and vegetables each day.   Next appointment: Follow up in one year for your annual wellness visit.   Preventive Care 3 Years and Older, Male  Preventive care refers to lifestyle choices and visits with your health care provider that can promote health and wellness. What does preventive care include? A yearly physical exam. This is also called an annual well check. Dental exams once or twice a year. Routine eye exams. Ask your health care provider how often you should have your eyes checked. Personal lifestyle choices, including: Daily care of your teeth and gums. Regular physical activity. Eating a healthy diet. Avoiding tobacco and drug use. Limiting alcohol use. Practicing safe sex. Taking low  doses of aspirin every day. Taking vitamin and mineral supplements as recommended by your health care provider. What happens during an annual well check? The services and screenings done by your health care provider during your annual well check will depend on your age, overall health, lifestyle risk factors, and family history of disease. Counseling  Your health care provider may ask you questions about your: Alcohol use. Tobacco use. Drug use. Emotional well-being. Home and relationship well-being. Sexual activity. Eating habits. History of falls. Memory and ability to understand (cognition). Work and work Statistician. Screening  You may have the following tests or measurements: Height, weight, and BMI. Blood pressure. Lipid and cholesterol levels. These may be checked every 5 years, or more frequently if you are over 86 years old. Skin check. Lung cancer screening. You may have this screening  every year starting at age 29 if you have a 30-pack-year history of smoking and currently smoke or have quit within the past 15 years. Fecal occult blood test (FOBT) of the stool. You may have this test every year starting at age 40. Flexible sigmoidoscopy or colonoscopy. You may have a sigmoidoscopy every 5 years or a colonoscopy every 10 years starting at age 34. Prostate cancer screening. Recommendations will vary depending on your family history and other risks. Hepatitis C blood test. Hepatitis B blood test. Sexually transmitted disease (STD) testing. Diabetes screening. This is done by checking your blood sugar (glucose) after you have not eaten for a while (fasting). You may have this done every 1-3 years. Abdominal aortic aneurysm (AAA) screening. You may need this if you are a current or former smoker. Osteoporosis. You may be screened starting at age 61 if you are at high risk. Talk with your health care provider about your test results, treatment options, and if necessary, the need for more tests. Vaccines  Your health care provider may recommend certain vaccines, such as: Influenza vaccine. This is recommended every year. Tetanus, diphtheria, and acellular pertussis (Tdap, Td) vaccine. You may need a Td booster every 10 years. Zoster vaccine. You may need this after age 65. Pneumococcal 13-valent conjugate (PCV13) vaccine. One dose is recommended after age 12. Pneumococcal polysaccharide (PPSV23) vaccine. One dose is recommended after age 66. Talk to your health care provider about which screenings and vaccines you need and how often you need them. This information is not intended to replace advice given to you by your health care provider. Make sure you discuss any questions you have with your health care provider. Document Released: 09/06/2015 Document Revised: 04/29/2016 Document Reviewed: 06/11/2015 Elsevier Interactive Patient Education  2017 [place] Prevention in  the Home Falls can cause injuries. They can happen to people of all ages. There are many things you can do to make your home safe and to help prevent falls. What can I do on the outside of my home? Regularly fix the edges of walkways and driveways and fix any cracks. Remove anything that might make you trip as you walk through a door, such as a raised step or threshold. Trim any bushes or trees on the path to your home. Use bright outdoor lighting. Clear any walking paths of anything that might make someone trip, such as rocks or tools. Regularly check to see if handrails are loose or broken. Make sure that both sides of any steps have handrails. Any raised decks and porches should have guardrails on the edges. Have any leaves, snow, or ice cleared regularly. Use sand or  salt on walking paths during winter. Clean up any spills in your garage right away. This includes oil or grease spills. What can I do in the bathroom? Use night lights. Install grab bars by the toilet and in the tub and shower. Do not use towel bars as grab bars. Use non-skid mats or decals in the tub or shower. If you need to sit down in the shower, use a plastic, non-slip stool. Keep the floor dry. Clean up any water that spills on the floor as soon as it happens. Remove soap buildup in the tub or shower regularly. Attach bath mats securely with double-sided non-slip rug tape. Do not have throw rugs and other things on the floor that can make you trip. What can I do in the bedroom? Use night lights. Make sure that you have a light by your bed that is easy to reach. Do not use any sheets or blankets that are too big for your bed. They should not hang down onto the floor. Have a firm chair that has side arms. You can use this for support while you get dressed. Do not have throw rugs and other things on the floor that can make you trip. What can I do in the kitchen? Clean up any spills right away. Avoid walking on wet  floors. Keep items that you use a lot in easy-to-reach places. If you need to reach something above you, use a strong step stool that has a grab bar. Keep electrical cords out of the way. Do not use floor polish or wax that makes floors slippery. If you must use wax, use non-skid floor wax. Do not have throw rugs and other things on the floor that can make you trip. What can I do with my stairs? Do not leave any items on the stairs. Make sure that there are handrails on both sides of the stairs and use them. Fix handrails that are broken or loose. Make sure that handrails are as long as the stairways. Check any carpeting to make sure that it is firmly attached to the stairs. Fix any carpet that is loose or worn. Avoid having throw rugs at the top or bottom of the stairs. If you do have throw rugs, attach them to the floor with carpet tape. Make sure that you have a light switch at the top of the stairs and the bottom of the stairs. If you do not have them, ask someone to add them for you. What else can I do to help prevent falls? Wear shoes that: Do not have high heels. Have rubber bottoms. Are comfortable and fit you well. Are closed at the toe. Do not wear sandals. If you use a stepladder: Make sure that it is fully opened. Do not climb a closed stepladder. Make sure that both sides of the stepladder are locked into place. Ask someone to hold it for you, if possible. Clearly mark and make sure that you can see: Any grab bars or handrails. First and last steps. Where the edge of each step is. Use tools that help you move around (mobility aids) if they are needed. These include: Canes. Walkers. Scooters. Crutches. Turn on the lights when you go into a dark area. Replace any light bulbs as soon as they burn out. Set up your furniture so you have a clear path. Avoid moving your furniture around. If any of your floors are uneven, fix them. If there are any pets around you, be aware of  where they are. Review your medicines with your doctor. Some medicines can make you feel dizzy. This can increase your chance of falling. Ask your doctor what other things that you can do to help prevent falls. This information is not intended to replace advice given to you by your health care provider. Make sure you discuss any questions you have with your health care provider. Document Released: 06/06/2009 Document Revised: 01/16/2016 Document Reviewed: 09/14/2014 Elsevier Interactive Patient Education  2017 [employer].

## 2022-08-26 NOTE — Progress Notes (Unsigned)
Subjective:   Ronald Dorsey is a 72 y.o. male who presents for an Initial Medicare Annual Wellness Visit.  Review of Systems    ***       Objective:    There were no vitals filed for this visit. There is no height or weight on file to calculate BMI.     04/06/2021    4:21 PM 09/22/2018   10:00 PM 09/22/2018    2:38 PM  Advanced Directives  Does Patient Have a Medical Advance Directive? Yes Yes No  Type of Advance Directive Living will Living will   Does patient want to make changes to medical advance directive?  No - Patient declined   Would patient like information on creating a medical advance directive?   Yes (ED - Information included in AVS)    Current Medications (verified) Outpatient Encounter Medications as of 08/27/2022  Medication Sig   amLODipine (NORVASC) 10 MG tablet TAKE 1 TABLET BY MOUTH EVERY DAY   apixaban (ELIQUIS) 5 MG TABS tablet Take 1 tablet (5 mg total) by mouth 2 (two) times daily.   BD INSULIN SYRINGE U/F 31G X 5/16" 1 ML MISC USE AS DIRECTED TO INJECT INSULIN 5 TIMES DAILY. DX:E11.65.   Continuous Blood Gluc Sensor (FREESTYLE LIBRE 14 DAY SENSOR) MISC Use as directed to monitor blood glucose continuously. Replace sensor Q14 days. Dx: e11.9   ezetimibe (ZETIA) 10 MG tablet TAKE 1 TABLET BY MOUTH EVERY DAY   hydrochlorothiazide (HYDRODIURIL) 25 MG tablet TAKE 1 TABLET BY MOUTH EVERY DAY   insulin lispro (HUMALOG) 100 UNIT/ML injection INJECT 0-30 UNITS INTO THE SKIN 3X DAILY WITH MEALS. PER SLIDING SCALE. MAX DAILY DOSE 90U   Insulin Syringes, Disposable, (B-D INSULIN SYRINGE 1CC) U-100 1 ML MISC Use as directed to inject insulin 5x daily. Dx:E11.65.   JARDIANCE 25 MG TABS tablet TAKE 1 TABLET BY MOUTH EVERY DAY BEFORE BREAKFAST   LANTUS 100 UNIT/ML injection INJECT 50 UNITS INTO THE SKIN 2 TIMES DAILY.   lisinopril (ZESTRIL) 20 MG tablet TAKE 2 TABLETS (40 MG TOTAL) BY MOUTH DAILY.   metFORMIN (GLUCOPHAGE) 500 MG tablet TAKE 2 TABLETS BY MOUTH TWICE A  DAY   metoprolol succinate (TOPROL-XL) 25 MG 24 hr tablet TAKE 1 TABLET BY MOUTH EVERY DAY   omega-3 acid ethyl esters (LOVAZA) 1 g capsule TAKE 2 CAPSULES BY MOUTH TWICE A DAY   pioglitazone (ACTOS) 30 MG tablet TAKE 1 TABLET BY MOUTH EVERY DAY BEFORE BREAKFAST   pregabalin (LYRICA) 150 MG capsule TAKE 1 CAPSULE BY MOUTH TWICE A DAY   rosuvastatin (CRESTOR) 20 MG tablet TAKE 1 TABLET BY MOUTH EVERY DAY   Facility-Administered Encounter Medications as of 08/27/2022  Medication   lidocaine-EPINEPHrine (XYLOCAINE-EPINEPHrine) 1 %-1:200000 (PF) injection 10 mL    Allergies (verified) Eggs or egg-derived products   History: Past Medical History:  Diagnosis Date   Apnea    Arthritis    Diabetes mellitus without complication (HCC)    Hypercholesterolemia    Hypertension    Neuromuscular disorder (HCC)    sciatica   Paroxysmal atrial fibrillation (HCC)    PSVT (paroxysmal supraventricular tachycardia) (HCC)    Past Surgical History:  Procedure Laterality Date   CARPAL TUNNEL RELEASE  2010   left wrist   EYE SURGERY     LEFT HEART CATH AND CORONARY ANGIOGRAPHY N/A 09/23/2018   Procedure: LEFT HEART CATH AND CORONARY ANGIOGRAPHY;  Surgeon: Burnell Blanks, MD;  Location: [facility];  Service: Cardiovascular;  Laterality: N/A;   TONSILLECTOMY  1961   Family History  Problem Relation Age of Onset   Hypertension Father    Social History   Socioeconomic History   Marital status: Married    Spouse name: Not on file   Number of children: Not on file   Years of education: Not on file   Highest education level: Not on file  Occupational History   Not on file  Tobacco Use   Smoking status: Former   Smokeless tobacco: Former  Substance and Sexual Activity   Alcohol use: Yes    Comment: 1 glass of wine a month or less   Drug use: No   Sexual activity: Yes    Comment: married, works in Surveyor, 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  {YES/NO:21197} Does the patient have any non-healing wounds?  {YES/NO:21197} Has the patient had any unintentional weight loss or weight gain?  {YES/NO:21197}  Diabetes:  Is the patient diabetic?  {YES/NO:21197} If diabetic, was a CBG obtained today?  {YES/NO:21197} Did the patient bring in their glucometer from home?  {YES/NO:21197} How often do you monitor your CBG's? ***.   Financial Strains and Diabetes Management:  Are you having any financial strains with the device, your supplies or your medication? {YES/NO:21197}.  Does the patient want to be seen by Chronic Care Management for management of their diabetes?  {YES/NO:21197} Would the patient like to be referred to a Nutritionist or for Diabetic Management?  {YES/NO:21197}  Diabetic Exams:  {Diabetic Eye Exam:2101801} {Diabetic Foot Exam:2101802}          Activities of Daily Living     No data to display          Patient Care Team: [name], MD as PCP - General (Family Medicine) Debara Pickett Nadean Corwin, MD as PCP - Cardiology (Cardiology) Edythe Clarity, [facility] as Pharmacist (Pharmacist) Lisabeth Pick, MD as Consulting Physician (Ophthalmology)  Indicate any recent Medical Services you may have received from other than [facility] providers in the past year (date may be approximate).     Assessment:   This is a routine wellness examination for  [place].  Hearing/Vision screen No results found.  Dietary issues and exercise activities discussed:     Goals Addressed   None    Depression Screen    11/25/2020    8:08 AM 11/27/2019   11:57 AM 06/16/2018    8:08 AM 04/05/2017    8:37 AM 04/05/2017    8:34 AM 04/23/2016    9:11 AM  PHQ 2/9 Scores  PHQ - 2 Score 0 0 0 0 0 0  PHQ- 9 Score      0    Fall Risk    11/25/2020    8:08 AM 11/27/2019   11:57 AM  06/16/2018    8:08 AM 04/05/2017    8:37 AM 04/05/2017    8:34 AM  Fall Risk   Falls in the past year? 0 0 No No No  Risk for fall due to : No Fall Risks      Follow up Falls evaluation completed Falls evaluation completed       [place]:  Any stairs in or around the home? {YES/NO:21197} If so, are there any without handrails? {YES/NO:21197} Home free of loose throw rugs in walkways, pet beds, electrical cords, etc? {YES/NO:21197} Adequate lighting in your home to reduce risk of falls? {YES/NO:21197}  ASSISTIVE DEVICES UTILIZED TO PREVENT FALLS:  Life alert? {YES/NO:21197} Use of a cane, walker or w/c? {YES/NO:21197} Grab bars in the bathroom? {YES/NO:21197} Shower chair or bench in shower? {YES/NO:21197} Elevated toilet seat or a handicapped toilet? {YES/NO:21197}  TIMED UP AND GO:  Was the test performed? {YES/NO:21197}.  Length of time to ambulate 10 feet: *** sec.   {Appearance of PZWC:5852778}  Cognitive Function:        Immunizations Immunization History  Administered Date(s) Administered   COVID-19, mRNA, vaccine(Comirnaty)12 years and older 06/06/2022   Fluad Quad(high Dose 65+) 06/06/2022   Influenza Split 05/24/2013   Influenza, High Dose Seasonal PF 06/16/2018, 05/11/2019, 05/11/2019   Influenza,inj,Quad PF,6+ Mos 08/20/2015, 04/23/2016   PFIZER(Purple Top)SARS-COV-2 Vaccination 09/10/2019, 09/30/2019, 05/24/2020   Pfizer Covid-19 Vaccine Bivalent Booster 61yr & up 09/03/2021   Pneumococcal Conjugate-13  04/05/2017   Pneumococcal Polysaccharide-23 08/24/2008, 02/01/2015   Tdap 11/02/2006, 02/17/2017    {TDAP status:2101805}  {Flu Vaccine status:2101806}  {Pneumococcal vaccine status:2101807}  {Covid-19 vaccine status:2101808}  Qualifies for Shingles Vaccine? {YES/NO:21197}  Zostavax completed {YES/NO:21197}  {Shingrix Completed?:2101804}  Screening Tests Health Maintenance  Topic Date Due   Diabetic kidney evaluation - Urine ACR  Never done   Zoster Vaccines- Shingrix (1 of 2) Never done   Medicare Annual Wellness (AWV)  04/05/2018   Pneumonia Vaccine 72 Years old (3 - PPSV23 or PCV20) 04/05/2022   HEMOGLOBIN A1C  08/19/2022   COLONOSCOPY (Pts 45-427yrInsurance coverage will need to be confirmed)  12/03/2022   Diabetic kidney evaluation - eGFR measurement  02/18/2023   FOOT EXAM  02/18/2023   OPHTHALMOLOGY EXAM  08/05/2023   DTaP/Tdap/Td (3 - Td or Tdap) 02/18/2027   INFLUENZA VACCINE  Completed   COVID-19 Vaccine  Completed   Hepatitis C Screening  Completed   HPV VACCINES  Aged Out    Health Maintenance  Health Maintenance Due  Topic Date Due   Diabetic kidney evaluation - Urine ACR  Never done   Zoster Vaccines- Shingrix (1 of 2) Never done   Medicare Annual Wellness (AWV)  04/05/2018   Pneumonia Vaccine 6576Years old (3 - PPSV23 or PCV20) 04/05/2022   HEMOGLOBIN A1C  08/19/2022    {Colorectal cancer screening:2101809}  Lung Cancer Screening: (Low Dose CT Chest recommended if Age 73-80 years, 30 pack-year currently smoking OR have quit w/in 15years.) {DOES NOT does:27190::"does not"} qualify.   Lung Cancer Screening Referral: ***  Additional Screening:  Hepatitis C Screening: {DOES NOT does:27190::"does not"} qualify; Completed ***  Vision Screening: Recommended annual ophthalmology exams for early detection of glaucoma and other disorders of the eye. Is the patient up to date with their annual eye exam?  {YES/NO:21197} Who is the provider or what is  the name of the office in which the patient attends annual eye exams? *** If pt is not established with  a provider, would they like to be referred to a provider to establish care? {YES/NO:21197}.   Dental Screening: Recommended annual dental exams for proper oral hygiene  Community Resource Referral / Chronic Care Management: CRR required this visit?  {YES/NO:21197}  CCM required this visit?  {YES/NO:21197}     Plan:     I have personally reviewed and noted the following in the patient's chart:   Medical and social history Use of alcohol, tobacco or illicit drugs  Current medications and supplements including opioid prescriptions. {Opioid [phone]} Functional ability and status Nutritional status Physical activity Advanced directives List of other physicians Hospitalizations, surgeries, and ER visits in previous 12 months Vitals Screenings to include cognitive, depression, and falls Referrals and appointments  In addition, I have reviewed and discussed with patient certain preventive protocols, quality metrics, and best practice recommendations. A written personalized care plan for preventive services as well as general preventive health recommendations were provided to patient.     Denman George [place], [place]   04/30/4717   Nurse Notes: ***

## 2022-08-27 ENCOUNTER — Ambulatory Visit (INDEPENDENT_AMBULATORY_CARE_PROVIDER_SITE_OTHER): Payer: Medicare Other

## 2022-08-27 ENCOUNTER — Other Ambulatory Visit: Payer: Self-pay | Admitting: Family Medicine

## 2022-08-27 VITALS — BP 126/68 | Ht 73.0 in | Wt 264.0 lb

## 2022-08-27 DIAGNOSIS — Z794 Long term (current) use of insulin: Secondary | ICD-10-CM | POA: Diagnosis not present

## 2022-08-27 DIAGNOSIS — I10 Essential (primary) hypertension: Secondary | ICD-10-CM | POA: Diagnosis not present

## 2022-08-27 DIAGNOSIS — E119 Type 2 diabetes mellitus without complications: Secondary | ICD-10-CM | POA: Diagnosis not present

## 2022-08-27 DIAGNOSIS — Z0000 Encounter for general adult medical examination without abnormal findings: Secondary | ICD-10-CM

## 2022-08-27 DIAGNOSIS — E785 Hyperlipidemia, unspecified: Secondary | ICD-10-CM | POA: Diagnosis not present

## 2022-08-27 DIAGNOSIS — Z0001 Encounter for general adult medical examination with abnormal findings: Secondary | ICD-10-CM | POA: Diagnosis not present

## 2022-08-27 NOTE — Addendum Note (Signed)
Addended by: Denman George B on: 08/27/2022 08:46 AM   Modules accepted: Level of Service

## 2022-08-27 NOTE — Telephone Encounter (Signed)
Requested Prescriptions  Pending Prescriptions Disp Refills   metFORMIN (GLUCOPHAGE) 500 MG tablet [Pharmacy Med Name: METFORMIN HCL 500 MG TABLET] 360 tablet 0    Sig: TAKE 2 TABLETS BY MOUTH TWICE A DAY     Endocrinology:  Diabetes - Biguanides Failed - 08/27/2022  1:28 AM      Failed - Cr in normal range and within 360 days    Creat  Date Value Ref Range Status  02/17/2022 1.42 (H) 0.70 - 1.28 mg/dL Final         Failed - HBA1C is between 0 and 7.9 and within 180 days    Hgb A1c MFr Bld  Date Value Ref Range Status  02/17/2022 6.9 (H) <5.7 % of total Hgb Final    Comment:    For someone without known diabetes, a hemoglobin A1c value of 6.5% or greater indicates that they may have  diabetes and this should be confirmed with a follow-up  test. . For someone with known diabetes, a value <7% indicates  that their diabetes is well controlled and a value  greater than or equal to 7% indicates suboptimal  control. A1c targets should be individualized based on  duration of diabetes, age, comorbid conditions, and  other considerations. . Currently, no consensus exists regarding use of hemoglobin A1c for diagnosis of diabetes for children. .          Failed - eGFR in normal range and within 360 days    GFR, Est African American  Date Value Ref Range Status  11/25/2020 89 > OR = 60 mL/min/1.54m Final   GFR, Est Non African American  Date Value Ref Range Status  11/25/2020 76 > OR = 60 mL/min/1.766mFinal   GFR, Estimated  Date Value Ref Range Status  04/06/2021 >60 >60 mL/min Final    Comment:    (NOTE) Calculated using the CKD-EPI Creatinine Equation (2021)    eGFR  Date Value Ref Range Status  02/17/2022 53 (L) > OR = 60 mL/min/1.7320minal    Comment:    The eGFR is based on the CKD-EPI 2021 equation. To calculate  the new eGFR from a previous Creatinine or Cystatin C result, go to https://www.kidney.org/professionals/ kdoqi/gfr%5Fcalculator          Failed -  B12 Level in normal range and within 720 days    No results found for: "VITAMINB12"       Failed - Valid encounter within last 6 months    Recent Outpatient Visits           1 year ago Diabetes mellitus, type II, insulin dependent ([place]Susy Frizzle[place]   2 years ago Diabetes mellitus, type II, insulin dependent ([place] Bro[place]wood[place]Susy FrizzleD   2 years ago Diabetes mellitus, type II, insulin dependent (HCC[place] BroDeWittckard, WarCammie McgeeD   2 years ago Renal insufficiency   Bro[place]altagcSusy FrizzleD   3 years ago Diabetes mellitus, type II, insulin dependent ([place]CC[place] BroGrand Blancckard, WarCammie McgeeD              Passed - CBC within normal limits and completed in the last 12 months    WBC  Date Value Ref Range Status  02/17/2022 7.6 3.8 - 10.8 Thousand/uL Final   RBC  Date Value Ref Range Status  02/17/2022 5.45 4.20 -  5.80 Million/uL Final   Hemoglobin  Date Value Ref Range Status  02/17/2022 16.3 13.2 - 17.1 g/dL Final   HCT  Date Value Ref Range Status  02/17/2022 49.0 38.5 - 50.0 % Final   MCHC  Date Value Ref Range Status  02/17/2022 33.3 32.0 - 36.0 g/dL Final   [facility]  Date Value Ref Range Status  02/17/2022 29.9 27.0 - 33.0 pg Final   MCV  Date Value Ref Range Status  02/17/2022 89.9 80.0 - 100.0 fL Final   No results found for: "PLTCOUNTKUC", "LABPLAT", "POCPLA" RDW  Date Value Ref Range Status  02/17/2022 13.5 11.0 - 15.0 % Final          lisinopril (ZESTRIL) 20 MG tablet [Pharmacy Med Name: LISINOPRIL 20 MG TABLET] 180 tablet 0    Sig: TAKE 2 TABLETS (40 MG TOTAL) BY MOUTH DAILY.     Cardiovascular:  ACE Inhibitors Failed - 08/27/2022  1:28 AM      Failed - Cr in normal range and within 180 days    Creat  Date Value Ref Range Status  02/17/2022 1.42 (H) 0.70 - 1.28 mg/dL Final         Failed - K in normal range and  within 180 days    Potassium  Date Value Ref Range Status  02/17/2022 4.0 3.5 - 5.3 mmol/L Final         Failed - Valid encounter within last 6 months    Recent Outpatient Visits           1 year ago Diabetes mellitus, type II, insulin dependent ([place])   [place] [name], MD   2 years ago Diabetes mellitus, type II, insulin dependent ([place])   [place] [name], MD   2 years ago Diabetes mellitus, type II, insulin dependent ([place])   [place] Pickard, Cammie Mcgee, MD   2 years ago Renal insufficiency   [place] [name], MD   3 years ago Diabetes mellitus, type II, insulin dependent ([place])   [place], Cammie Mcgee, MD              Passed - Patient is not pregnant      Passed - Last BP in normal range    BP Readings from Last 1 Encounters:  08/27/22 126/68

## 2022-08-28 LAB — LIPID PANEL
Cholesterol: 91 mg/dL (ref <200)
HDL: 43 mg/dL (ref >=40)
LDL Cholesterol (Calc): 28 mg/dL (calc)
Non-HDL Cholesterol (Calc): 48 mg/dL (calc) (ref <130)
Total CHOL/HDL Ratio: 2.1 (calc) (ref <5.0)
Triglycerides: 112 mg/dL (ref <150)

## 2022-08-28 LAB — COMPLETE METABOLIC PANEL WITH GFR
AG Ratio: 2 (calc) (ref 1.0–2.5)
ALT: 21 U/L (ref 9–46)
AST: 22 U/L (ref 10–35)
Albumin: 4.5 g/dL (ref 3.6–5.1)
Alkaline phosphatase (APISO): 56 U/L (ref 35–144)
BUN/Creatinine Ratio: 32 (calc) — ABNORMAL HIGH (ref 6–22)
BUN: 33 mg/dL — ABNORMAL HIGH (ref 7–25)
CO2: 27 mmol/L (ref 20–32)
Calcium: 9.4 mg/dL (ref 8.6–10.3)
Chloride: 103 mmol/L (ref 98–110)
Creat: 1.02 mg/dL (ref 0.70–1.28)
Globulin: 2.3 g/dL (calc) (ref 1.9–3.7)
Glucose, Bld: 94 mg/dL (ref 65–99)
Potassium: 4.3 mmol/L (ref 3.5–5.3)
Sodium: 140 mmol/L (ref 135–146)
Total Bilirubin: 0.7 mg/dL (ref 0.2–1.2)
Total Protein: 6.8 g/dL (ref 6.1–8.1)
eGFR: 79 mL/min/{1.73_m2} (ref >=60)

## 2022-08-28 LAB — CBC WITH DIFFERENTIAL/PLATELET
Absolute Monocytes: 929 cells/uL (ref 200–950)
Basophils Absolute: 58 cells/uL (ref 0–200)
Basophils Relative: 0.8 %
Eosinophils Absolute: 252 cells/uL (ref 15–500)
Eosinophils Relative: 3.5 %
HCT: 43.1 % (ref 38.5–50.0)
Hemoglobin: 14.6 g/dL (ref 13.2–17.1)
Lymphs Abs: 2232 cells/uL (ref 850–3900)
MCH: 30.5 pg (ref 27.0–33.0)
MCHC: 33.9 g/dL (ref 32.0–36.0)
MCV: 90 fL (ref 80.0–100.0)
MPV: 10.7 fL (ref 7.5–12.5)
Monocytes Relative: 12.9 %
Neutro Abs: 3730 cells/uL (ref 1500–7800)
Neutrophils Relative %: 51.8 %
Platelets: 176 10*3/uL (ref 140–400)
RBC: 4.79 10*6/uL (ref 4.20–5.80)
RDW: 14.3 % (ref 11.0–15.0)
Total Lymphocyte: 31 %
WBC: 7.2 10*3/uL (ref 3.8–10.8)

## 2022-08-28 LAB — MICROALBUMIN / CREATININE URINE RATIO
Creatinine, Urine: 59 mg/dL (ref 20–320)
Microalb, Ur: <0.2 mg/dL

## 2022-08-28 LAB — HEMOGLOBIN A1C
Hgb A1c MFr Bld: 7.4 % of total Hgb — ABNORMAL HIGH (ref <5.7)
Mean Plasma Glucose: 166 mg/dL
eAG (mmol/L): 9.2 mmol/L

## 2022-08-31 ENCOUNTER — Other Ambulatory Visit: Payer: Self-pay

## 2022-08-31 ENCOUNTER — Telehealth: Payer: Self-pay | Admitting: Family Medicine

## 2022-08-31 DIAGNOSIS — E785 Hyperlipidemia, unspecified: Secondary | ICD-10-CM

## 2022-08-31 MED ORDER — ROSUVASTATIN CALCIUM 20 MG PO TABS: 20.0000 mg | ORAL_TABLET | Freq: Every day | ORAL | 1 refills | Status: DC

## 2022-08-31 NOTE — Telephone Encounter (Signed)
Received eFax from pharmacy to request refill or new script of  rosuvastatin (CRESTOR) 20 MG tablet   Ph[facility]acy:  [id number] [place],  - [address] [place]Phone: [phone] Fax: [phone] #: [id number]   Please advise pharmacist at [phone]

## 2022-09-08 NOTE — Progress Notes (Signed)
Carelink Summary Report / Loop Recorder

## 2022-09-11 ENCOUNTER — Telehealth: Payer: Self-pay

## 2022-09-11 ENCOUNTER — Other Ambulatory Visit: Payer: Self-pay | Admitting: Family Medicine

## 2022-09-11 DIAGNOSIS — E119 Type 2 diabetes mellitus without complications: Secondary | ICD-10-CM

## 2022-09-11 NOTE — Telephone Encounter (Signed)
Last OV 01/28/22.  Requested Prescriptions  Pending Prescriptions Disp Refills   pioglitazone (ACTOS) 30 MG tablet [Pharmacy Med Name: PIOGLITAZONE HCL 30 MG TABLET] 90 tablet 0    Sig: TAKE 1 TABLET BY MOUTH EVERY DAY BEFORE BREAKFAST     Endocrinology:  Diabetes - Glitazones - pioglitazone Failed - 09/11/2022 10:18 AM      Failed - Valid encounter within last 6 months    Recent Outpatient Visits           1 year ago Diabetes mellitus, type II, insulin dependent ([place])   [place] Pickard, Cammie Mcgee, MD   2 years ago Diabetes mellitus, type II, insulin dependent ([place])   [place] Pickard, Cammie Mcgee, MD   2 years ago Diabetes mellitus, type II, insulin dependent ([place])   [place] Pickard, Cammie Mcgee, MD   2 years ago Renal insufficiency   [place] [name], MD   3 years ago Diabetes mellitus, type II, insulin dependent ([place])   [facility] Pickard, Cammie Mcgee, MD              Passed - HBA1C is between 0 and 7.9 and within 180 days    Hgb A1c MFr Bld  Date Value Ref Range Status  08/27/2022 7.4 (H) <5.7 % of total Hgb Final    Comment:    For someone without known diabetes, a hemoglobin A1c value of 6.5% or greater indicates that they may have  diabetes and this should be confirmed with a follow-up  test. . For someone with known diabetes, a value <7% indicates  that their diabetes is well controlled and a value  greater than or equal to 7% indicates suboptimal  control. A1c targets should be individualized based on  duration of diabetes, age, comorbid conditions, and  other considerations. . Currently, no consensus exists regarding use of hemoglobin A1c for diagnosis of diabetes for children. Marland Kitchen

## 2022-09-11 NOTE — Telephone Encounter (Signed)
Rec' fax from [place]  Requested for most recent OV to be sent.   OV notes up front to be fax back

## 2022-09-16 ENCOUNTER — Other Ambulatory Visit: Payer: Self-pay | Admitting: Family Medicine

## 2022-09-17 ENCOUNTER — Other Ambulatory Visit: Payer: Self-pay | Admitting: Family Medicine

## 2022-09-17 DIAGNOSIS — I10 Essential (primary) hypertension: Secondary | ICD-10-CM

## 2022-09-17 NOTE — Telephone Encounter (Signed)
Prescription Request  09/17/2022  Is this a "Controlled Substance" medicine? No  LOV:  02/17/2022  What is the name of the medication or equipment?   amLODipine (NORVASC) 10 MG tablet   Have you contacted your pharmacy to request a refill? Yes   Which pharmacy would you like this sent to?    [id number] [place] [place] - [address], [place]Phone: [phone] Fax: [phone] #: [id number]  Patient notified that their request is being sent to the clinical staff for review and that they should receive a response within 2 business days.   Please advise pharmacist.

## 2022-09-17 NOTE — Telephone Encounter (Signed)
Requested medication (s) are due for refill today: yes  Requested medication (s) are on the active medication list: yes  Last refill:  03/17/22 #60 5 refills  Future visit scheduled: no   Notes to clinic:  last OV 02/17/22. No refills remain. Do you want to refill Rx?     Requested Prescriptions  Pending Prescriptions Disp Refills   ELIQUIS 5 MG TABS tablet [Pharmacy Med Name: ELIQUIS 5 MG TABLET] 60 tablet 5    Sig: TAKE 1 TABLET BY MOUTH TWICE A DAY     Hematology:  Anticoagulants - apixaban Failed - 09/16/2022  7:44 PM      Failed - Valid encounter within last 12 months    Recent Outpatient Visits           1 year ago Diabetes mellitus, type II, insulin dependent ([place])   [place] Pickard, Cammie Mcgee, MD   2 years ago Diabetes mellitus, type II, insulin dependent ([place])   [place] Pickard, Cammie Mcgee, MD   2 years ago Diabetes mellitus, type II, insulin dependent ([place])   [place] Pickard, Cammie Mcgee, MD   2 years ago Renal insufficiency   [place] [name], MD   3 years ago Diabetes mellitus, type II, insulin dependent ([place])   [place] Pickard, Cammie Mcgee, MD              Passed - PLT in normal range and within 360 days    Platelets  Date Value Ref Range Status  08/27/2022 176 140 - 400 Thousand/uL Final         Passed - HGB in normal range and within 360 days    Hemoglobin  Date Value Ref Range Status  08/27/2022 14.6 13.2 - 17.1 g/dL Final         Passed - HCT in normal range and within 360 days    HCT  Date Value Ref Range Status  08/27/2022 43.1 38.5 - 50.0 % Final         Passed - Cr in normal range and within 360 days    Creat  Date Value Ref Range Status  08/27/2022 1.02 0.70 - 1.28 mg/dL Final   Creatinine, Urine  Date Value Ref Range Status  08/27/2022 59 20 - 320 mg/dL Final         Passed - AST in normal range and within 360 days    AST   Date Value Ref Range Status  08/27/2022 22 10 - 35 U/L Final         Passed - ALT in normal range and within 360 days    ALT  Date Value Ref Range Status  08/27/2022 21 9 - 46 U/L Final

## 2022-09-18 MED ORDER — AMLODIPINE BESYLATE 10 MG PO TABS: 10.0000 mg | ORAL_TABLET | Freq: Every day | ORAL | 0 refills | Status: DC

## 2022-09-18 NOTE — Telephone Encounter (Signed)
Requested medication (s) are due for refill today: yes  Requested medication (s) are on the active medication list: yes  Last refill:  06/27/21 #90 3 RF  Future visit scheduled: no  Notes to clinic:  MyChart message sent to pt to make appointment for CPE and med refills.    Requested Prescriptions  Pending Prescriptions Disp Refills   amLODipine (NORVASC) 10 MG tablet 90 tablet 3    Sig: Take 1 tablet (10 mg total) by mouth daily.     Cardiovascular: Calcium Channel Blockers 2 Failed - 09/17/2022  2:51 PM      Failed - Valid encounter within last 6 months    Recent Outpatient Visits           1 year ago Diabetes mellitus, type II, insulin dependent ([place])   [place] [name], MD   2 years ago Diabetes mellitus, type II, insulin dependent ([place])   [place] [name], MD   2 years ago Diabetes mellitus, type II, insulin dependent ([place])   [place] Pickard, Cammie Mcgee, MD   2 years ago Renal insufficiency   [place] [name], MD   3 years ago Diabetes mellitus, type II, insulin dependent ([place])   [place] Pickard, Cammie Mcgee, MD              Passed - Last BP in normal range    BP Readings from Last 1 Encounters:  08/27/22 126/68         Passed - Last Heart Rate in normal range    Pulse Readings from Last 1 Encounters:  02/17/22 (!) 58

## 2022-09-21 ENCOUNTER — Ambulatory Visit: Payer: Medicare Other | Attending: Cardiovascular Disease

## 2022-09-21 DIAGNOSIS — R55 Syncope and collapse: Secondary | ICD-10-CM

## 2022-09-22 LAB — CUP PACEART REMOTE DEVICE CHECK
Date Time Interrogation Session: 20240128230942
Implantable Pulse Generator Implant Date: 20221031

## 2022-10-07 DIAGNOSIS — H25013 Cortical age-related cataract, bilateral: Secondary | ICD-10-CM | POA: Diagnosis not present

## 2022-10-07 DIAGNOSIS — H18413 Arcus senilis, bilateral: Secondary | ICD-10-CM | POA: Diagnosis not present

## 2022-10-07 DIAGNOSIS — H2511 Age-related nuclear cataract, right eye: Secondary | ICD-10-CM | POA: Diagnosis not present

## 2022-10-07 DIAGNOSIS — H25043 Posterior subcapsular polar age-related cataract, bilateral: Secondary | ICD-10-CM | POA: Diagnosis not present

## 2022-10-07 DIAGNOSIS — E119 Type 2 diabetes mellitus without complications: Secondary | ICD-10-CM | POA: Diagnosis not present

## 2022-10-07 DIAGNOSIS — H2513 Age-related nuclear cataract, bilateral: Secondary | ICD-10-CM | POA: Diagnosis not present

## 2022-10-26 ENCOUNTER — Ambulatory Visit (INDEPENDENT_AMBULATORY_CARE_PROVIDER_SITE_OTHER): Payer: Medicare Other

## 2022-10-26 DIAGNOSIS — R55 Syncope and collapse: Secondary | ICD-10-CM | POA: Diagnosis not present

## 2022-10-26 LAB — CUP PACEART REMOTE DEVICE CHECK
Date Time Interrogation Session: 20240303230457
Implantable Pulse Generator Implant Date: 20221031

## 2022-10-27 ENCOUNTER — Encounter: Payer: Self-pay | Admitting: Family Medicine

## 2022-10-29 ENCOUNTER — Other Ambulatory Visit: Payer: Self-pay | Admitting: Family Medicine

## 2022-10-30 ENCOUNTER — Telehealth: Payer: Self-pay | Admitting: *Deleted

## 2022-10-30 NOTE — Telephone Encounter (Signed)
   Patient Name: Ronald Dorsey  DOB: [birth year] MRN: [id number]  Primary Cardiologist: Pixie Casino, MD  Chart reviewed as part of pre-operative protocol coverage. Cataract extractions are recognized in guidelines as low risk surgeries that do not typically require specific preoperative testing or holding of blood thinner therapy. Therefore, given past medical history and time since last visit, based on ACC/AHA guidelines, Cartez Mogle would be at acceptable risk for the planned procedure without further cardiovascular testing.   I will route this recommendation to the requesting party via Epic fax function and remove from pre-op pool.  Please call with questions.  Emmaline Life, NP-C  10/30/2022, 8:53 AM [address] Office [phone] Fax [phone]

## 2022-10-30 NOTE — Telephone Encounter (Signed)
Requested Prescriptions  Pending Prescriptions Disp Refills   LANTUS 100 UNIT/ML injection [Pharmacy Med Name: LANTUS 100 UNIT/ML VIAL] 90 mL 0    Sig: INJECT 50 UNITS INTO THE SKIN 2 TIMES DAILY.     Endocrinology:  Diabetes - Insulins Failed - 10/29/2022  9:38 PM      Failed - Valid encounter within last 6 months    Recent Outpatient Visits           1 year ago Diabetes mellitus, type II, insulin dependent ([place])   [place] Pickard, Cammie Mcgee, MD   2 years ago Diabetes mellitus, type II, insulin dependent ([place])   [place] Pickard, Cammie Mcgee, MD   2 years ago Diabetes mellitus, type II, insulin dependent ([place])   [place] Pickard, Cammie Mcgee, MD   2 years ago Renal insufficiency   [place] [name], MD   3 years ago Diabetes mellitus, type II, insulin dependent ([place])   [facility] Pickard, Cammie Mcgee, MD              Passed - HBA1C is between 0 and 7.9 and within 180 days    Hgb A1c MFr Bld  Date Value Ref Range Status  08/27/2022 7.4 (H) <5.7 % of total Hgb Final    Comment:    For someone without known diabetes, a hemoglobin A1c value of 6.5% or greater indicates that they may have  diabetes and this should be confirmed with a follow-up  test. . For someone with known diabetes, a value <7% indicates  that their diabetes is well controlled and a value  greater than or equal to 7% indicates suboptimal  control. A1c targets should be individualized based on  duration of diabetes, age, comorbid conditions, and  other considerations. . Currently, no consensus exists regarding use of hemoglobin A1c for diagnosis of diabetes for children. Marland Kitchen           HUMALOG 100 UNIT/ML injection [Pharmacy Med Name: HUMALOG 100 UNIT/ML VIAL] 80 mL 0    Sig: INJECT 0-30 UNITS INTO THE SKIN 3X DAILY WITH MEALS. PER SLIDING SCALE. MAX DAILY DOSE 90U     Endocrinology:  Diabetes - Insulins  Failed - 10/29/2022  9:38 PM      Failed - Valid encounter within last 6 months    Recent Outpatient Visits           1 year ago Diabetes mellitus, type II, insulin dependent ([place])   [place] [name], MD   2 years ago Diabetes mellitus, type II, insulin dependent ([place])   [place] [name], MD   2 years ago Diabetes mellitus, type II, insulin dependent ([place])   [place] [name], MD   2 years ago Renal insufficiency   [place] [name], MD   3 years ago Diabetes mellitus, type II, insulin dependent ([place])   [facility] Pickard, Cammie Mcgee, MD              Passed - HBA1C is between 0 and 7.9 and within 180 days    Hgb A1c MFr Bld  Date Value Ref Range Status  08/27/2022 7.4 (H) <5.7 % of total Hgb Final    Comment:    For someone without known diabetes, a hemoglobin A1c value of 6.5% or greater indicates that they may have  diabetes and this should be confirmed with a follow-up  test. . For someone with known diabetes, a value <7% indicates  that their diabetes is well controlled and a value  greater than or equal to 7% indicates suboptimal  control. A1c targets should be individualized based on  duration of diabetes, age, comorbid conditions, and  other considerations. . Currently, no consensus exists regarding use of hemoglobin A1c for diagnosis of diabetes for children. Marland Kitchen

## 2022-10-30 NOTE — Telephone Encounter (Signed)
   Pre-operative Risk Assessment    Patient Name: Ronald Dorsey  DOB: 12-13-1950 MRN: [id number]      Request for Surgical Clearance    Procedure:   CATARACT EXTRACTION WITH INTROCULAR LENS IMPLANTATION OFD THE RIGHT EYE THEN FOLLOWED BY THE LEFT EYE  Date of Surgery:  Clearance 11/10/22    11/17/22                             Surgeon:  DR. KARL STONECIPHER Surgeon's Group or Practice Name:  [place] Phone number:  [phone] Fax number:  [phone]   Type of Clearance Requested:   - Medical    Type of Anesthesia:   TOPICAL ANESTHESIA WITH IV MEDICATION   Additional requests/questions:    Astrid Divine   10/30/2022, 8:12 AM

## 2022-11-03 NOTE — Progress Notes (Signed)
Carelink Summary Report / Loop Recorder 

## 2022-11-11 ENCOUNTER — Other Ambulatory Visit: Payer: Self-pay | Admitting: Family Medicine

## 2022-11-11 DIAGNOSIS — I10 Essential (primary) hypertension: Secondary | ICD-10-CM

## 2022-11-13 ENCOUNTER — Telehealth: Payer: Self-pay | Admitting: Pharmacist

## 2022-11-13 NOTE — Progress Notes (Signed)
Care Management & Coordination Services Pharmacy Team   Reason for Encounter: Diabetes   Contacted patient to discuss diabetes disease state. Unsuccessful outreach. Left voicemail for patient to return call.  Current antihyperglycemic regimen:  pioglitazone (ACTOS) 30 MG tablet  metFORMIN (GLUCOPHAGE) 500 MG tablet  LANTUS 100 UNIT/ML injection  JARDIANCE 25 MG TABS tablet  HUMALOG 100 UNIT/ML injection     Patient verbally confirms he is taking the above medications as directed.   What diet changes have been made to improve diabetes control? Patient reported  What recent interventions/DTPs have been made to improve glycemic control:  Patient reported  Have there been any recent hospitalizations or ED visits since last visit with PharmD? No  Patient  hypoglycemic symptoms, including   Patient  hyperglycemic symptoms, including   How often are you checking your blood sugar?   What are your blood sugars ranging?  Fasting:  Before meals:  After meals:  Bedtime:   During the week, how often does your blood glucose drop below 70?   Are you checking your feet daily/regularly?   Adherence Review: Is the patient currently on a STATIN medication? Yes Is the patient currently on ACE/ARB medication? Yes Does the patient have >5 day gap between last estimated fill dates? No   Chart Updates:  Recent office visits:  None noted.   Recent consult visits:  None noted.   Hospital visits:  None in previous 6 months  Medications: Outpatient Encounter Medications as of 11/13/2022  Medication Sig   amLODipine (NORVASC) 10 MG tablet TAKE 1 TABLET (10 MG TOTAL) BY MOUTH DAILY. PT NEED CPE APPT W/PCP FOR FUTURE REFILLS   BD INSULIN SYRINGE U/F 31G X 5/16" 1 ML MISC USE AS DIRECTED TO INJECT INSULIN 5 TIMES DAILY. DX:E11.65.   Continuous Blood Gluc Sensor (FREESTYLE LIBRE 14 DAY SENSOR) MISC Use as directed to monitor blood glucose continuously. Replace sensor Q14 days. Dx:  e11.9   ELIQUIS 5 MG TABS tablet TAKE 1 TABLET BY MOUTH TWICE A DAY   ezetimibe (ZETIA) 10 MG tablet TAKE 1 TABLET BY MOUTH EVERY DAY   HUMALOG 100 UNIT/ML injection INJECT 0-30 UNITS INTO THE SKIN 3X DAILY WITH MEALS. PER SLIDING SCALE. MAX DAILY DOSE 90U   hydrochlorothiazide (HYDRODIURIL) 25 MG tablet TAKE 1 TABLET BY MOUTH EVERY DAY   Insulin Syringes, Disposable, (B-D INSULIN SYRINGE 1CC) U-100 1 ML MISC Use as directed to inject insulin 5x daily. Dx:E11.65.   JARDIANCE 25 MG TABS tablet TAKE 1 TABLET BY MOUTH EVERY DAY BEFORE BREAKFAST   LANTUS 100 UNIT/ML injection INJECT 50 UNITS INTO THE SKIN 2 TIMES DAILY.   lisinopril (ZESTRIL) 20 MG tablet TAKE 2 TABLETS (40 MG TOTAL) BY MOUTH DAILY.   metFORMIN (GLUCOPHAGE) 500 MG tablet TAKE 2 TABLETS BY MOUTH TWICE A DAY   metoprolol succinate (TOPROL-XL) 25 MG 24 hr tablet TAKE 1 TABLET BY MOUTH EVERY DAY   omega-3 acid ethyl esters (LOVAZA) 1 g capsule TAKE 2 CAPSULES BY MOUTH TWICE A DAY   pioglitazone (ACTOS) 30 MG tablet TAKE 1 TABLET BY MOUTH EVERY DAY BEFORE BREAKFAST   pregabalin (LYRICA) 150 MG capsule TAKE 1 CAPSULE BY MOUTH TWICE A DAY   rosuvastatin (CRESTOR) 20 MG tablet Take 1 tablet (20 mg total) by mouth daily.   Facility-Administered Encounter Medications as of 11/13/2022  Medication   lidocaine-EPINEPHrine (XYLOCAINE-EPINEPHrine) 1 %-1:200000 (PF) injection 10 mL    Recent Relevant Labs: Lab Results  Component Value Date/Time   HGBA1C 7.4 (  H) 08/27/2022 08:29 AM   HGBA1C 6.9 (H) 02/17/2022 08:32 AM   MICROALBUR <0.2 08/27/2022 08:29 AM   MICROALBUR 0.7 02/17/2022 08:32 AM    Kidney Function Lab Results  Component Value Date/Time   CREATININE 1.02 08/27/2022 08:29 AM   CREATININE 1.42 (H) 02/17/2022 08:32 AM   GFRNONAA >60 04/06/2021 05:19 PM   GFRNONAA 76 11/25/2020 08:26 AM   GFRAA 89 11/25/2020 08:26 AM    Star Rating Drugs:  Medication:  Last Fill: Day Supply   Care Gaps: Annual wellness visit in last  year? No last done 09/17/21 Last eye exam / retinopathy screening: due 08/05/23 Last diabetic foot exam: due 02/18/23   Future Appointments  Date Time Provider [place]  11/30/2022  7:00 AM [facility] DEVICE REMOTES [facility]  12/07/2022  8:30 AM Mayra Reel, NP [facility] None  08/31/2023  8:45 AM [facility]-ANNUAL WELLNESS VISIT [facility] [place] [place]

## 2022-11-28 ENCOUNTER — Other Ambulatory Visit: Payer: Self-pay | Admitting: Family Medicine

## 2022-11-30 ENCOUNTER — Ambulatory Visit (INDEPENDENT_AMBULATORY_CARE_PROVIDER_SITE_OTHER): Payer: Medicare Other

## 2022-11-30 DIAGNOSIS — R55 Syncope and collapse: Secondary | ICD-10-CM | POA: Diagnosis not present

## 2022-11-30 LAB — CUP PACEART REMOTE DEVICE CHECK
Date Time Interrogation Session: 20240405230156
Implantable Pulse Generator Implant Date: 20221031

## 2022-11-30 NOTE — Progress Notes (Deleted)
Cardiology Clinic Note   Date: 11/30/2022 ID: Ronald Dorsey, DOB 1950/09/25, MRN [id number]  Primary Cardiologist:  Chrystie Nose, MD  Patient Profile    Ronald Dorsey is a 72 y.o. male who presents to the clinic today for 1 year follow-up.   Past medical history significant for: PAF. Onset during [facility] 09/23/2018. Echo 09/24/2018: EF 60 to 65%.  Grade I DD.  Mild thickening/calcification of the aortic valve. 30-day event monitor 11/21/2018: Sinus rhythm, no A-fib detected. Syncope. Loop recorder implantation 06/23/2021. Remote check 11/27/2022: Battery status okay.  Normal device function.  No new symptoms, tachycardia, bradycardia, or pause episodes.  No new A-fib episodes AT AF noted. Hypertension.  Hyperlipidemia. Lipid panel 08/27/2022: LDL 28, HDL 43, TG 112, total 91. T2DM   History of Present Illness    Ronald Dorsey was first evaluated by Dr. Rennis Golden on 09/23/2018 during hospitalization for chest pain.  Patient presented to the ED on 09/22/2018 with complaints of chest pain radiating to left neck and left arm onset while working.  No associated shortness of breath, nausea, vomiting, diarrhea.  Relief with SL NTG.  Troponin negative x 3.  Patient underwent LHC which showed widely patent coronary arteries.  Onset of A-fib during catheterization.  Rate controlled on Cardizem drip then converted to NSR on the morning of discharge.  Patient was discharged on Eliquis.  Patient had a syncopal episode August 2022.  Implantable loop recorder was recommended.  Patient was last seen in the office by Dr. Rennis Golden on 10/23/2021.  At that time he was doing well with no recurrent syncopal episodes and well-controlled BP.  No medication changes were made.  Today, patient ***  PAF.  Onset during heart catheterization January 2020.  Converted to NSR on Cardizem drip.  Loop recorder remote check April 2024 did not show A-fib.  Patient*** Continue Eliquis and metoprolol. Syncope.  Patient with syncopal  episode August 2022.  Loop recorder implanted October 2022.  Remote check April 2024 showed no new symptoms, tachycardia, bradycardia, or pause episodes.  No further episodes of syncope.  Continue device checks as scheduled. Hypertension.  BP today*** Patient denies headaches or dizziness.  Continue amlodipine, hydrochlorothiazide, lisinopril, and metoprolol. Hyperlipidemia.  LDL January 2024 28, at goal.  Continue rosuvastatin and Zetia.   ROS: All other systems reviewed and are otherwise negative except as noted in History of Present Illness.  Studies Reviewed    ECG personally reviewed by me today: ***  No significant changes from ***  Risk Assessment/Calculations    {Does this patient have ATRIAL FIBRILLATION?:[phone]} No BP recorded.  {Refresh Note OR Click here to enter BP  :1}***        Physical Exam    VS:  There were no vitals taken for this visit. , BMI There is no height or weight on file to calculate BMI.  GEN: Well nourished, well developed, in no acute distress. Neck: No JVD or carotid bruits. Cardiac: *** RRR. No murmurs. No rubs or gallops.   Respiratory:  Respirations regular and unlabored. Clear to auscultation without rales, wheezing or rhonchi. GI: Soft, nontender, nondistended. Extremities: Radials/DP/PT 2+ and equal bilaterally. No clubbing or cyanosis. No edema ***  Skin: Warm and dry, no rash. Neuro: Strength intact.  Assessment & Plan   ***  Disposition: ***     {Are you ordering a CV Procedure (e.g. stress test, cath, DCCV, TEE, etc)?   Press F2        :[id number]}   Signed, Gavin Pound  Annitta Needs, DNP, NP-C

## 2022-11-30 NOTE — Telephone Encounter (Signed)
Patient needs OV for additional refills.  Requested Prescriptions  Pending Prescriptions Disp Refills   lisinopril (ZESTRIL) 20 MG tablet [Pharmacy Med Name: LISINOPRIL 20 MG TABLET] 180 tablet 0    Sig: TAKE 2 TABLETS (40 MG TOTAL) BY MOUTH DAILY.     Cardiovascular:  ACE Inhibitors Failed - 11/28/2022  1:10 AM      Failed - Valid encounter within last 6 months    Recent Outpatient Visits           2 years ago Diabetes mellitus, type II, insulin dependent (HCC)   [facility] Pickard, Priscille Heidelberg, MD   2 years ago Diabetes mellitus, type II, insulin dependent (HCC)   [facility] Donita Brooks, MD   2 years ago Diabetes mellitus, type II, insulin dependent (HCC)   [facility] Pickard, Priscille Heidelberg, MD   3 years ago Renal insufficiency   [facility] Donita Brooks, MD   3 years ago Diabetes mellitus, type II, insulin dependent (HCC)   [facility] Pickard, Priscille Heidelberg, MD       Future Appointments             In 1 week Daine Floras, Gavin Pound, NP [facility] - Cr in normal range and within 180 days    Creat  Date Value Ref Range Status  08/27/2022 1.02 0.70 - 1.28 mg/dL Final   Creatinine, Urine  Date Value Ref Range Status  08/27/2022 59 20 - 320 mg/dL Final         Passed - K in normal range and within 180 days    Potassium  Date Value Ref Range Status  08/27/2022 4.3 3.5 - 5.3 mmol/L Final         Passed - Patient is not pregnant      Passed - Last BP in normal range    BP Readings from Last 1 Encounters:  08/27/22 126/68          metFORMIN (GLUCOPHAGE) 500 MG tablet [Pharmacy Med Name: METFORMIN HCL 500 MG TABLET] 360 tablet 0    Sig: TAKE 2 TABLETS BY MOUTH TWICE A DAY     Endocrinology:  Diabetes - Biguanides Failed - 11/28/2022  1:10 AM      Failed - B12 Level in normal range and within 720 days    No results found for:  "VITAMINB12"       Failed - Valid encounter within last 6 months    Recent Outpatient Visits           2 years ago Diabetes mellitus, type II, insulin dependent (HCC)   [facility] Donita Brooks, MD   2 years ago Diabetes mellitus, type II, insulin dependent (HCC)   [facility] Donita Brooks, MD   2 years ago Diabetes mellitus, type II, insulin dependent (HCC)   [facility] Pickard, Priscille Heidelberg, MD   3 years ago Renal insufficiency   [facility] Donita Brooks, MD   3 years ago Diabetes mellitus, type II, insulin dependent (HCC)   [facility] Pickard, Priscille Heidelberg, MD       Future Appointments             In 1 week [place], Gavin Pound, NP [facility]  Passed - Cr in normal range and within 360 days    Creat  Date Value Ref Range Status  08/27/2022 1.02 0.70 - 1.28 mg/dL Final   Creatinine, Urine  Date Value Ref Range Status  08/27/2022 59 20 - 320 mg/dL Final         Passed - HBA1C is between 0 and 7.9 and within 180 days    Hgb A1c MFr Bld  Date Value Ref Range Status  08/27/2022 7.4 (H) <5.7 % of total Hgb Final    Comment:    For someone without known diabetes, a hemoglobin A1c value of 6.5% or greater indicates that they may have  diabetes and this should be confirmed with a follow-up  test. . For someone with known diabetes, a value <7% indicates  that their diabetes is well controlled and a value  greater than or equal to 7% indicates suboptimal  control. A1c targets should be individualized based on  duration of diabetes, age, comorbid conditions, and  other considerations. . Currently, no consensus exists regarding use of hemoglobin A1c for diagnosis of diabetes for children. .          Passed - eGFR in normal range and within 360 days    GFR, Est African American  Date Value Ref Range Status  11/25/2020 89 > OR = 60  mL/min/1.42m2 Final   GFR, Est Non African American  Date Value Ref Range Status  11/25/2020 76 > OR = 60 mL/min/1.31m2 Final   GFR, Estimated  Date Value Ref Range Status  04/06/2021 >60 >60 mL/min Final    Comment:    (NOTE) Calculated using the CKD-EPI Creatinine Equation (2021)    eGFR  Date Value Ref Range Status  08/27/2022 79 > OR = 60 mL/min/1.33m2 Final         Passed - CBC within normal limits and completed in the last 12 months    WBC  Date Value Ref Range Status  08/27/2022 7.2 3.8 - 10.8 Thousand/uL Final   RBC  Date Value Ref Range Status  08/27/2022 4.79 4.20 - 5.80 Million/uL Final   Hemoglobin  Date Value Ref Range Status  08/27/2022 14.6 13.2 - 17.1 g/dL Final   HCT  Date Value Ref Range Status  08/27/2022 43.1 38.5 - 50.0 % Final   MCHC  Date Value Ref Range Status  08/27/2022 33.9 32.0 - 36.0 g/dL Final   [facility]  Date Value Ref Range Status  08/27/2022 30.5 27.0 - 33.0 pg Final   MCV  Date Value Ref Range Status  08/27/2022 90.0 80.0 - 100.0 fL Final   No results found for: "PLTCOUNTKUC", "LABPLAT", "POCPLA" RDW  Date Value Ref Range Status  08/27/2022 14.3 11.0 - 15.0 % Final

## 2022-12-01 DIAGNOSIS — H269 Unspecified cataract: Secondary | ICD-10-CM | POA: Diagnosis not present

## 2022-12-01 DIAGNOSIS — H2512 Age-related nuclear cataract, left eye: Secondary | ICD-10-CM | POA: Diagnosis not present

## 2022-12-01 DIAGNOSIS — H25042 Posterior subcapsular polar age-related cataract, left eye: Secondary | ICD-10-CM | POA: Diagnosis not present

## 2022-12-01 DIAGNOSIS — H2511 Age-related nuclear cataract, right eye: Secondary | ICD-10-CM | POA: Diagnosis not present

## 2022-12-01 NOTE — Progress Notes (Signed)
Carelink Summary Report / Loop Recorder 

## 2022-12-07 ENCOUNTER — Ambulatory Visit: Payer: Medicare Other | Admitting: Student

## 2022-12-15 ENCOUNTER — Other Ambulatory Visit: Payer: Self-pay | Admitting: Family Medicine

## 2022-12-15 DIAGNOSIS — Z794 Long term (current) use of insulin: Secondary | ICD-10-CM

## 2022-12-15 NOTE — Telephone Encounter (Signed)
Requested medications are due for refill today.  yes  Requested medications are on the active medications list.  yes  Last refill. 09/11/2022 #90 0 rf  Future visit scheduled.   yes  Notes to clinic.  Pt is more than 3 months overdue for OV.    Requested Prescriptions  Pending Prescriptions Disp Refills   pioglitazone (ACTOS) 30 MG tablet [Pharmacy Med Name: PIOGLITAZONE HCL 30 MG TABLET] 90 tablet 0    Sig: TAKE 1 TABLET BY MOUTH EVERY DAY BEFORE BREAKFAST     Endocrinology:  Diabetes - Glitazones - pioglitazone Failed - 12/15/2022  1:37 AM      Failed - Valid encounter within last 6 months    Recent Outpatient Visits           2 years ago Diabetes mellitus, type II, insulin dependent (HCC)   [facility] Pickard, Priscille Heidelberg, MD   2 years ago Diabetes mellitus, type II, insulin dependent (HCC)   [facility] Pickard, Priscille Heidelberg, MD   2 years ago Diabetes mellitus, type II, insulin dependent (HCC)   [facility] Pickard, Priscille Heidelberg, MD   3 years ago Renal insufficiency   [facility] Donita Brooks, MD   3 years ago Diabetes mellitus, type II, insulin dependent (HCC)   [facility] Pickard, Priscille Heidelberg, MD              Passed - HBA1C is between 0 and 7.9 and within 180 days    Hgb A1c MFr Bld  Date Value Ref Range Status  08/27/2022 7.4 (H) <5.7 % of total Hgb Final    Comment:    For someone without known diabetes, a hemoglobin A1c value of 6.5% or greater indicates that they may have  diabetes and this should be confirmed with a follow-up  test. . For someone with known diabetes, a value <7% indicates  that their diabetes is well controlled and a value  greater than or equal to 7% indicates suboptimal  control. A1c targets should be individualized based on  duration of diabetes, age, comorbid conditions, and  other considerations. . Currently, no consensus exists regarding use  of hemoglobin A1c for diagnosis of diabetes for children. Marland Kitchen

## 2022-12-30 ENCOUNTER — Ambulatory Visit (INDEPENDENT_AMBULATORY_CARE_PROVIDER_SITE_OTHER): Payer: Medicare Other

## 2022-12-30 DIAGNOSIS — R55 Syncope and collapse: Secondary | ICD-10-CM | POA: Diagnosis not present

## 2022-12-31 LAB — CUP PACEART REMOTE DEVICE CHECK
Date Time Interrogation Session: 20240508230641
Implantable Pulse Generator Implant Date: 20221031

## 2023-01-04 NOTE — Progress Notes (Signed)
Carelink Summary Report / Loop Recorder 

## 2023-01-14 ENCOUNTER — Telehealth: Payer: Self-pay

## 2023-01-14 DIAGNOSIS — Z79899 Other long term (current) drug therapy: Secondary | ICD-10-CM

## 2023-01-14 DIAGNOSIS — I493 Ventricular premature depolarization: Secondary | ICD-10-CM

## 2023-01-14 NOTE — Telephone Encounter (Signed)
Following alert received from [employer] received for note increase in PVC burden according to trend graphs.  Patient called about increase In PVC burden. Denies any symptoms but does occasionally miss am medications which is Toprol-XL. Stressed compliance of medications to patient. Patient voiced understanding. Routing to Dr. Royann Shivers for review.

## 2023-01-14 NOTE — Telephone Encounter (Signed)
Yes, please stress to him how important is to take that medication faithfully.  Stopping the Toprol XL abruptly can lead to "rebound" arrhythmia. Also, please check a basic metabolic panel and magnesium to make sure that his electrolytes are low because of the treatment with hydrochlorothiazide.

## 2023-01-15 NOTE — Telephone Encounter (Signed)
Spoke with Ronald Dorsey and her husband- with her/beside her. Gave them Dr [employer]. [place] the beach, so will have to do lab work on Wednesday. Also talked about staying well hydrated while at the beach. Verbalized understanding.   Lab orders placed

## 2023-01-19 ENCOUNTER — Other Ambulatory Visit: Payer: Self-pay

## 2023-01-19 DIAGNOSIS — I493 Ventricular premature depolarization: Secondary | ICD-10-CM | POA: Diagnosis not present

## 2023-01-19 DIAGNOSIS — Z79899 Other long term (current) drug therapy: Secondary | ICD-10-CM

## 2023-01-20 LAB — BASIC METABOLIC PANEL
BUN/Creatinine Ratio: 24 (ref 10–24)
BUN: 37 mg/dL — ABNORMAL HIGH (ref 8–27)
CO2: 22 mmol/L (ref 20–29)
Calcium: 9.2 mg/dL (ref 8.6–10.2)
Chloride: 100 mmol/L (ref 96–106)
Creatinine, Ser: 1.55 mg/dL — ABNORMAL HIGH (ref 0.76–1.27)
Glucose: 174 mg/dL — ABNORMAL HIGH (ref 70–99)
Potassium: 5.1 mmol/L (ref 3.5–5.2)
Sodium: 138 mmol/L (ref 134–144)
eGFR: 47 mL/min/{1.73_m2} — ABNORMAL LOW (ref >59)

## 2023-01-20 LAB — MAGNESIUM: Magnesium: 2.3 mg/dL (ref 1.6–2.3)

## 2023-01-20 NOTE — Telephone Encounter (Signed)
   Patient Name: Ronald Dorsey  DOB: [birth year] MRN: [id number]  Primary Cardiologist: Chrystie Nose, MD  Chart reviewed as part of pre-operative protocol coverage. Cataract extractions are recognized in guidelines as low risk surgeries that do not typically require specific preoperative testing or holding of blood thinner therapy. Therefore, given past medical history and time since last visit, based on ACC/AHA guidelines, Ronald Dorsey would be at acceptable risk for the planned procedure without further cardiovascular testing.   I will route this recommendation to the requesting party via Epic fax function and remove from pre-op pool.  Please call with questions.  Napoleon Form, Leodis Rains, NP 01/20/2023, 12:27 PM

## 2023-01-20 NOTE — Progress Notes (Signed)
Carelink Summary Report / Loop Recorder 

## 2023-01-20 NOTE — Telephone Encounter (Addendum)
Received another clearance for the procedure below.  [facility] said they need clearance within 30 days.  Was previously scheduled in March.  New Surgery date is 01/26/2023.  Request for Surgical Clearance     Procedure:   CATARACT EXTRACTION WITH INTROCULAR LENS IMPLANTATION OF THE  LEFT EYE   Date of Surgery:  Clearance 01/26/2023                             Surgeon:  DR. KARL STONECIPHER Surgeon's Group or Practice Name:  [facility] Phone number:  [phone] Fax number:  [phone]   Type of Clearance Requested:   - Medical    Type of Anesthesia:   TOPICAL ANESTHESIA WITH IV MEDICATION   Additional requests/questions:

## 2023-01-26 DIAGNOSIS — H269 Unspecified cataract: Secondary | ICD-10-CM | POA: Diagnosis not present

## 2023-01-26 DIAGNOSIS — H2512 Age-related nuclear cataract, left eye: Secondary | ICD-10-CM | POA: Diagnosis not present

## 2023-01-28 ENCOUNTER — Other Ambulatory Visit: Payer: Self-pay | Admitting: Family Medicine

## 2023-01-29 NOTE — Telephone Encounter (Signed)
Patient needs OV for additional refills.  Requested Prescriptions  Pending Prescriptions Disp Refills   LANTUS 100 UNIT/ML injection [Pharmacy Med Name: LANTUS 100 UNIT/ML VIAL] 90 mL 0    Sig: INJECT 50 UNITS INTO THE SKIN 2 TIMES DAILY.     Endocrinology:  Diabetes - Insulins Failed - 01/28/2023  6:41 PM      Failed - Valid encounter within last 6 months    Recent Outpatient Visits           2 years ago Diabetes mellitus, type II, insulin dependent (HCC)   [facility] Pickard, Priscille Heidelberg, MD   2 years ago Diabetes mellitus, type II, insulin dependent (HCC)   [facility] Pickard, Priscille Heidelberg, MD   3 years ago Diabetes mellitus, type II, insulin dependent (HCC)   [facility] Pickard, Priscille Heidelberg, MD   3 years ago Renal insufficiency   [facility] Donita Brooks, MD   3 years ago Diabetes mellitus, type II, insulin dependent (HCC)   [facility] Pickard, Priscille Heidelberg, MD              Passed - HBA1C is between 0 and 7.9 and within 180 days    Hgb A1c MFr Bld  Date Value Ref Range Status  08/27/2022 7.4 (H) <5.7 % of total Hgb Final    Comment:    For someone without known diabetes, a hemoglobin A1c value of 6.5% or greater indicates that they may have  diabetes and this should be confirmed with a follow-up  test. . For someone with known diabetes, a value <7% indicates  that their diabetes is well controlled and a value  greater than or equal to 7% indicates suboptimal  control. A1c targets should be individualized based on  duration of diabetes, age, comorbid conditions, and  other considerations. . Currently, no consensus exists regarding use of hemoglobin A1c for diagnosis of diabetes for children. Marland Kitchen           HUMALOG 100 UNIT/ML injection [Pharmacy Med Name: HUMALOG 100 UNIT/ML VIAL] 80 mL 0    Sig: INJECT 0-30 UNITS INTO THE SKIN 3X DAILY WITH MEALS. PER SLIDING SCALE. MAX DAILY DOSE 90U      Endocrinology:  Diabetes - Insulins Failed - 01/28/2023  6:41 PM      Failed - Valid encounter within last 6 months    Recent Outpatient Visits           2 years ago Diabetes mellitus, type II, insulin dependent (HCC)   [facility] Donita Brooks, MD   2 years ago Diabetes mellitus, type II, insulin dependent (HCC)   [facility] Donita Brooks, MD   3 years ago Diabetes mellitus, type II, insulin dependent (HCC)   [facility] Pickard, Priscille Heidelberg, MD   3 years ago Renal insufficiency   [facility] Donita Brooks, MD   3 years ago Diabetes mellitus, type II, insulin dependent (HCC)   [facility] Pickard, Priscille Heidelberg, MD              Passed - HBA1C is between 0 and 7.9 and within 180 days    Hgb A1c MFr Bld  Date Value Ref Range Status  08/27/2022 7.4 (H) <5.7 % of total Hgb Final    Comment:    For someone without known diabetes, a hemoglobin A1c value of 6.5%  or greater indicates that they may have  diabetes and this should be confirmed with a follow-up  test. . For someone with known diabetes, a value <7% indicates  that their diabetes is well controlled and a value  greater than or equal to 7% indicates suboptimal  control. A1c targets should be individualized based on  duration of diabetes, age, comorbid conditions, and  other considerations. . Currently, no consensus exists regarding use of hemoglobin A1c for diagnosis of diabetes for children. Marland Kitchen

## 2023-02-01 ENCOUNTER — Ambulatory Visit (INDEPENDENT_AMBULATORY_CARE_PROVIDER_SITE_OTHER): Payer: Medicare Other

## 2023-02-01 DIAGNOSIS — R55 Syncope and collapse: Secondary | ICD-10-CM | POA: Diagnosis not present

## 2023-02-01 LAB — CUP PACEART REMOTE DEVICE CHECK
Date Time Interrogation Session: 20240609230322
Implantable Pulse Generator Implant Date: 20221031

## 2023-02-12 ENCOUNTER — Other Ambulatory Visit: Payer: Self-pay | Admitting: Family Medicine

## 2023-02-12 ENCOUNTER — Telehealth: Payer: Self-pay | Admitting: Cardiovascular Disease

## 2023-02-12 DIAGNOSIS — G709 Myoneural disorder, unspecified: Secondary | ICD-10-CM

## 2023-02-12 NOTE — Telephone Encounter (Signed)
Informed wife patient needs to go to the ED

## 2023-02-12 NOTE — Telephone Encounter (Signed)
He has an implantable loop recorder. Please make sure this is interrogated

## 2023-02-12 NOTE — Telephone Encounter (Signed)
Requested medication (s) are due for refill today: yes  Requested medication (s) are on the active medication list: yes  Last refill:  08/07/22  Future visit scheduled: yes  Notes to clinic:  Unable to refill per protocol, cannot delegate.      Requested Prescriptions  Pending Prescriptions Disp Refills   pregabalin (LYRICA) 150 MG capsule [Pharmacy Med Name: PREGABALIN 150 MG CAPSULE] 180 capsule     Sig: TAKE 1 CAPSULE BY MOUTH TWICE A DAY     Not Delegated - Neurology:  Anticonvulsants - Controlled - pregabalin Failed - 02/12/2023 12:44 PM      Failed - This refill cannot be delegated      Failed - Cr in normal range and within 360 days    Creat  Date Value Ref Range Status  08/27/2022 1.02 0.70 - 1.28 mg/dL Final   Creatinine, Ser  Date Value Ref Range Status  01/19/2023 1.55 (H) 0.76 - 1.27 mg/dL Final   Creatinine, Urine  Date Value Ref Range Status  08/27/2022 59 20 - 320 mg/dL Final         Failed - Valid encounter within last 12 months    Recent Outpatient Visits           2 years ago Diabetes mellitus, type II, insulin dependent (HCC)   [facility] Donita Brooks, MD   2 years ago Diabetes mellitus, type II, insulin dependent (HCC)   [facility] Donita Brooks, MD   3 years ago Diabetes mellitus, type II, insulin dependent (HCC)   [facility] Pickard, Priscille Heidelberg, MD   3 years ago Renal insufficiency   [facility] Donita Brooks, MD   3 years ago Diabetes mellitus, type II, insulin dependent (HCC)   [facility] Pickard, Priscille Heidelberg, MD       Future Appointments             In 3 weeks Pickard, Priscille Heidelberg, MD [facility], [facility]            Passed - Completed PHQ-2 or PHQ-9 in the last 360 days

## 2023-02-12 NOTE — Telephone Encounter (Signed)
Patient's wife called stating patient just had an episode were he was incoherent, his eyes rolled back.  It lasted about 3-4 mins. Patient is currently at work she is going to go get him, but she is not sure what to do with him.

## 2023-02-22 NOTE — Progress Notes (Signed)
Carelink Summary Report / Loop Recorder 

## 2023-02-26 ENCOUNTER — Other Ambulatory Visit: Payer: Self-pay | Admitting: Family Medicine

## 2023-02-26 DIAGNOSIS — E785 Hyperlipidemia, unspecified: Secondary | ICD-10-CM

## 2023-02-26 NOTE — Telephone Encounter (Signed)
Requested Prescriptions  Pending Prescriptions Disp Refills   rosuvastatin (CRESTOR) 20 MG tablet [Pharmacy Med Name: ROSUVASTATIN CALCIUM 20 MG TAB] 90 tablet 0    Sig: TAKE 1 TABLET BY MOUTH EVERY DAY     Cardiovascular:  Antilipid - Statins 2 Failed - 02/26/2023  2:04 AM      Failed - Cr in normal range and within 360 days    Creat  Date Value Ref Range Status  08/27/2022 1.02 0.70 - 1.28 mg/dL Final   Creatinine, Ser  Date Value Ref Range Status  01/19/2023 1.55 (H) 0.76 - 1.27 mg/dL Final   Creatinine, Urine  Date Value Ref Range Status  08/27/2022 59 20 - 320 mg/dL Final         Failed - Valid encounter within last 12 months    Recent Outpatient Visits           2 years ago Diabetes mellitus, type II, insulin dependent (HCC)   [facility] Donita Brooks, MD   2 years ago Diabetes mellitus, type II, insulin dependent (HCC)   [facility] Donita Brooks, MD   3 years ago Diabetes mellitus, type II, insulin dependent (HCC)   [facility] Pickard, Priscille Heidelberg, MD   3 years ago Renal insufficiency   [facility] Donita Brooks, MD   3 years ago Diabetes mellitus, type II, insulin dependent (HCC)   [facility] Pickard, Priscille Heidelberg, MD       Future Appointments             In 1 week Tanya Nones, Priscille Heidelberg, MD [facility], [facility]            Failed - Lipid Panel in normal range within the last 12 months    Cholesterol  Date Value Ref Range Status  08/27/2022 91 <200 mg/dL Final   LDL Cholesterol (Calc)  Date Value Ref Range Status  08/27/2022 28 mg/dL (calc) Final    Comment:    Reference range: <100 . Desirable range <100 mg/dL for primary prevention;   <70 mg/dL for patients with CHD or diabetic patients  with > or = 2 CHD risk factors. Marland Kitchen LDL-C is now calculated using the Martin-Hopkins  calculation, which is a validated novel method providing  better  accuracy than the Friedewald equation in the  estimation of LDL-C.  Horald Pollen et al. Lenox Ahr. 1610;960(45): 2061-2068  (http://education.[employer].com/faq/FAQ164)    HDL  Date Value Ref Range Status  08/27/2022 43 > OR = 40 mg/dL Final   Triglycerides  Date Value Ref Range Status  08/27/2022 112 <150 mg/dL Final         Passed - Patient is not pregnant       lisinopril (ZESTRIL) 20 MG tablet [Pharmacy Med Name: LISINOPRIL 20 MG TABLET] 180 tablet 0    Sig: TAKE 2 TABLETS (40 MG TOTAL) BY MOUTH DAILY.     Cardiovascular:  ACE Inhibitors Failed - 02/26/2023  2:04 AM      Failed - Cr in normal range and within 180 days    Creat  Date Value Ref Range Status  08/27/2022 1.02 0.70 - 1.28 mg/dL Final   Creatinine, Ser  Date Value Ref Range Status  01/19/2023 1.55 (H) 0.76 - 1.27 mg/dL Final   Creatinine, Urine  Date Value Ref Range Status  08/27/2022 59 20 - 320 mg/dL Final  Failed - Valid encounter within last 6 months    Recent Outpatient Visits           2 years ago Diabetes mellitus, type II, insulin dependent (HCC)   [facility] Pickard, Priscille Heidelberg, MD   2 years ago Diabetes mellitus, type II, insulin dependent (HCC)   [facility] Pickard, Priscille Heidelberg, MD   3 years ago Diabetes mellitus, type II, insulin dependent (HCC)   [facility] Pickard, Priscille Heidelberg, MD   3 years ago Renal insufficiency   [facility] Donita Brooks, MD   3 years ago Diabetes mellitus, type II, insulin dependent (HCC)   [facility] Pickard, Priscille Heidelberg, MD       Future Appointments             In 1 week Pickard, Priscille Heidelberg, MD [facility], [facility]            Passed - K in normal range and within 180 days    Potassium  Date Value Ref Range Status  01/19/2023 5.1 3.5 - 5.2 mmol/L Final         Passed - Patient is not pregnant      Passed - Last BP in normal range    BP  Readings from Last 1 Encounters:  08/27/22 126/68          metFORMIN (GLUCOPHAGE) 500 MG tablet [Pharmacy Med Name: METFORMIN HCL 500 MG TABLET] 360 tablet 0    Sig: TAKE 2 TABLETS BY MOUTH TWICE A DAY     Endocrinology:  Diabetes - Biguanides Failed - 02/26/2023  2:04 AM      Failed - Cr in normal range and within 360 days    Creat  Date Value Ref Range Status  08/27/2022 1.02 0.70 - 1.28 mg/dL Final   Creatinine, Ser  Date Value Ref Range Status  01/19/2023 1.55 (H) 0.76 - 1.27 mg/dL Final   Creatinine, Urine  Date Value Ref Range Status  08/27/2022 59 20 - 320 mg/dL Final         Failed - HBA1C is between 0 and 7.9 and within 180 days    Hgb A1c MFr Bld  Date Value Ref Range Status  08/27/2022 7.4 (H) <5.7 % of total Hgb Final    Comment:    For someone without known diabetes, a hemoglobin A1c value of 6.5% or greater indicates that they may have  diabetes and this should be confirmed with a follow-up  test. . For someone with known diabetes, a value <7% indicates  that their diabetes is well controlled and a value  greater than or equal to 7% indicates suboptimal  control. A1c targets should be individualized based on  duration of diabetes, age, comorbid conditions, and  other considerations. . Currently, no consensus exists regarding use of hemoglobin A1c for diagnosis of diabetes for children. .          Failed - eGFR in normal range and within 360 days    GFR, Est African American  Date Value Ref Range Status  11/25/2020 89 > OR = 60 mL/min/1.46m2 Final   GFR, Est Non African American  Date Value Ref Range Status  11/25/2020 76 > OR = 60 mL/min/1.45m2 Final   GFR, Estimated  Date Value Ref Range Status  04/06/2021 >60 >60 mL/min Final    Comment:    (NOTE) Calculated using the CKD-EPI Creatinine Equation (  2021)    eGFR  Date Value Ref Range Status  01/19/2023 47 (L) >59 mL/min/1.73 Final         Failed - B12 Level in normal range and within  720 days    No results found for: "VITAMINB12"       Failed - Valid encounter within last 6 months    Recent Outpatient Visits           2 years ago Diabetes mellitus, type II, insulin dependent (HCC)   [facility] Donita Brooks, MD   2 years ago Diabetes mellitus, type II, insulin dependent (HCC)   [facility] Donita Brooks, MD   3 years ago Diabetes mellitus, type II, insulin dependent (HCC)   [facility] Pickard, Priscille Heidelberg, MD   3 years ago Renal insufficiency   [facility] Donita Brooks, MD   3 years ago Diabetes mellitus, type II, insulin dependent (HCC)   [facility] Pickard, Priscille Heidelberg, MD       Future Appointments             In 1 week Pickard, Priscille Heidelberg, MD [facility], [facility]            Passed - CBC within normal limits and completed in the last 12 months    WBC  Date Value Ref Range Status  08/27/2022 7.2 3.8 - 10.8 Thousand/uL Final   RBC  Date Value Ref Range Status  08/27/2022 4.79 4.20 - 5.80 Million/uL Final   Hemoglobin  Date Value Ref Range Status  08/27/2022 14.6 13.2 - 17.1 g/dL Final   HCT  Date Value Ref Range Status  08/27/2022 43.1 38.5 - 50.0 % Final   MCHC  Date Value Ref Range Status  08/27/2022 33.9 32.0 - 36.0 g/dL Final   [facility]  Date Value Ref Range Status  08/27/2022 30.5 27.0 - 33.0 pg Final   MCV  Date Value Ref Range Status  08/27/2022 90.0 80.0 - 100.0 fL Final   No results found for: "PLTCOUNTKUC", "LABPLAT", "POCPLA" RDW  Date Value Ref Range Status  08/27/2022 14.3 11.0 - 15.0 % Final

## 2023-03-05 ENCOUNTER — Ambulatory Visit (INDEPENDENT_AMBULATORY_CARE_PROVIDER_SITE_OTHER): Payer: Medicare Other | Admitting: Family Medicine

## 2023-03-05 ENCOUNTER — Encounter: Payer: Self-pay | Admitting: Family Medicine

## 2023-03-05 VITALS — BP 118/64 | HR 61 | Temp 97.5°F | Ht 73.0 in | Wt 264.8 lb

## 2023-03-05 DIAGNOSIS — E119 Type 2 diabetes mellitus without complications: Secondary | ICD-10-CM | POA: Diagnosis not present

## 2023-03-05 DIAGNOSIS — N400 Enlarged prostate without lower urinary tract symptoms: Secondary | ICD-10-CM | POA: Diagnosis not present

## 2023-03-05 DIAGNOSIS — Z794 Long term (current) use of insulin: Secondary | ICD-10-CM

## 2023-03-05 DIAGNOSIS — I480 Paroxysmal atrial fibrillation: Secondary | ICD-10-CM | POA: Diagnosis not present

## 2023-03-05 DIAGNOSIS — Z125 Encounter for screening for malignant neoplasm of prostate: Secondary | ICD-10-CM | POA: Diagnosis not present

## 2023-03-05 DIAGNOSIS — E785 Hyperlipidemia, unspecified: Secondary | ICD-10-CM

## 2023-03-05 DIAGNOSIS — I10 Essential (primary) hypertension: Secondary | ICD-10-CM

## 2023-03-05 LAB — CBC WITH DIFFERENTIAL/PLATELET
Absolute Monocytes: 861 cells/uL (ref 200–950)
Basophils Relative: 1.4 %
Hemoglobin: 14.5 g/dL (ref 13.2–17.1)
Lymphs Abs: 2351 cells/uL (ref 850–3900)
MCH: 30.4 pg (ref 27.0–33.0)
MCV: 91.4 fL (ref 80.0–100.0)
Monocytes Relative: 11.8 %
Neutro Abs: 3745 cells/uL (ref 1500–7800)
Neutrophils Relative %: 51.3 %
Total Lymphocyte: 32.2 %

## 2023-03-05 MED ORDER — SEMAGLUTIDE(0.25 OR 0.5MG/DOS) 2 MG/3ML ~~LOC~~ SOPN: 0.5000 mg | PEN_INJECTOR | SUBCUTANEOUS | 2 refills | Status: DC

## 2023-03-05 NOTE — Progress Notes (Signed)
Subjective:    Patient ID: Ronald Dorsey, male    DOB: 17-May-1951, 72 y.o.   MRN: [id number] Patient is a very pleasant 72 year old Caucasian gentleman here today for follow-up of his diabetes.  Prior to his gastric bypass, he weighed 318 lbs. he is successfully [employer].  Likely due to obesity.  He denies any chest pain.  He did have an episode of syncope.  He went to the emergency room and this was attributed to drops in his blood pressure.  His blood pressure is frequently under 110 systolic.  He is taking hydrochlorothiazide, amlodipine, and lisinopril.  He is currently on Lantus 60 units daily and Humalog 10 units with meals.  He has continuous glucose monitoring.  65% of the time he is within range.  He seldom has hypoglycemia Past Medical History:  Diagnosis Date   Apnea    Arthritis    Diabetes mellitus without complication (HCC)    Hypercholesterolemia    Hypertension    Neuromuscular disorder (HCC)    sciatica   Paroxysmal atrial fibrillation (HCC)    PSVT (paroxysmal supraventricular tachycardia)    Past Surgical History:  Procedure Laterality Date   CARPAL TUNNEL RELEASE  2010   left wrist   EYE SURGERY     LEFT HEART CATH AND CORONARY ANGIOGRAPHY N/A 09/23/2018   Procedure: LEFT HEART CATH AND CORONARY ANGIOGRAPHY;  Surgeon: Kathleene Hazel, MD;  Location: [facility];  Service: Cardiovascular;  Laterality: N/A;   TONSILLECTOMY  1961   Current Outpatient Medications on File Prior to Visit  Medication Sig Dispense Refill   amLODipine (NORVASC) 10 MG tablet TAKE 1 TABLET (10 MG TOTAL) BY MOUTH DAILY. PT NEED CPE APPT W/PCP FOR FUTURE REFILLS 90 tablet 1   BD INSULIN SYRINGE U/F 31G X 5/16" 1 ML MISC USE AS DIRECTED TO INJECT INSULIN 5 TIMES DAILY. DX:E11.65.     Continuous Blood Gluc Sensor (FREESTYLE LIBRE 14 DAY SENSOR) MISC Use as directed to monitor blood glucose continuously. Replace sensor Q14 days. Dx: e11.9 2 each 11   ELIQUIS 5 MG TABS tablet TAKE 1  TABLET BY MOUTH TWICE A DAY 60 tablet 5   ezetimibe (ZETIA) 10 MG tablet TAKE 1 TABLET BY MOUTH EVERY DAY 90 tablet 3   HUMALOG 100 UNIT/ML injection INJECT 0-30 UNITS INTO THE SKIN 3X DAILY WITH MEALS. PER SLIDING SCALE. MAX DAILY DOSE 90U 80 mL 0   hydrochlorothiazide (HYDRODIURIL) 25 MG tablet TAKE 1 TABLET BY MOUTH EVERY DAY 90 tablet 3   Insulin Syringes, Disposable, (B-D INSULIN SYRINGE 1CC) U-100 1 ML MISC Use as directed to inject insulin 5x daily. Dx:E11.65. 500 each 12   JARDIANCE 25 MG TABS tablet TAKE 1 TABLET BY MOUTH EVERY DAY BEFORE BREAKFAST 90 tablet 3   LANTUS 100 UNIT/ML injection INJECT 50 UNITS INTO THE SKIN 2 TIMES DAILY. 90 mL 0   lisinopril (ZESTRIL) 20 MG tablet TAKE 2 TABLETS (40 MG TOTAL) BY MOUTH DAILY. 180 tablet 0   metFORMIN (GLUCOPHAGE) 500 MG tablet TAKE 2 TABLETS BY MOUTH TWICE A DAY 360 tablet 0   metoprolol succinate (TOPROL-XL) 25 MG 24 hr tablet TAKE 1 TABLET BY MOUTH EVERY DAY 90 tablet 3   omega-3 acid ethyl esters (LOVAZA) 1 g capsule TAKE 2 CAPSULES BY MOUTH TWICE A DAY 360 capsule 1   pioglitazone (ACTOS) 30 MG tablet TAKE 1 TABLET BY MOUTH EVERY DAY BEFORE BREAKFAST 90 tablet 0   pregabalin (LYRICA) 150 MG capsule TAKE  1 CAPSULE BY MOUTH TWICE A DAY 180 capsule 3   rosuvastatin (CRESTOR) 20 MG tablet TAKE 1 TABLET BY MOUTH EVERY DAY 90 tablet 0   Current Facility-Administered Medications on File Prior to Visit  Medication Dose Route Frequency Provider Last Rate Last Admin   lidocaine-EPINEPHrine (XYLOCAINE-EPINEPHrine) 1 %-1:200000 (PF) injection 10 mL  10 mL Infiltration Once Croitoru, Mihai, MD       Allergies  Allergen Reactions   Egg-Derived Products Nausea And Vomiting   Social History   Socioeconomic History   Marital status: Married    Spouse name: Not on file   Number of children: Not on file   Years of education: Not on file   Highest education level: Not on file  Occupational History   Not on file  Tobacco Use   Smoking status:  Former   Smokeless tobacco: Former  Substance and Sexual Activity   Alcohol use: Yes    Comment: 1 glass of wine a month or less   Drug use: No   Sexual activity: Yes    Comment: married, works in Nurse, learning disability business  Other Topics Concern   Not on file  Social History Narrative   Not on file   Social Determinants of Health   Financial Resource Strain: Low Risk  (08/27/2022)   Overall Financial Resource Strain (CARDIA)    Difficulty of Paying Living Expenses: Not hard at all  Food Insecurity: No Food Insecurity (08/27/2022)   Hunger Vital Sign    Worried About Running Out of Food in the Last Year: Never true    Ran Out of Food in the Last Year: Never true  Transportation Needs: No Transportation Needs (08/27/2022)   PRAPARE - Administrator, Civil Service (Medical): No    Lack of Transportation (Non-Medical): No  Physical Activity: Inactive (08/27/2022)   Exercise Vital Sign    Days of Exercise per Week: 0 days    Minutes of Exercise per Session: 0 min  Stress: No Stress Concern Present (08/27/2022)   [employer] of Occupational Health - Occupational Stress Questionnaire    Feeling of Stress : Not at all  Social Connections: Socially Integrated (08/27/2022)   Social Connection and Isolation Panel [NHANES]    Frequency of Communication with Friends and Family: More than three times a week    Frequency of Social Gatherings with Friends and Family: Three times a week    Attends Religious Services: More than 4 times per year    Active Member of Clubs or Organizations: Yes    Attends Banker Meetings: More than 4 times per year    Marital Status: Married  Catering manager Violence: Not At Risk (08/27/2022)   Humiliation, Afraid, Rape, and Kick questionnaire    Fear of Current or Ex-Partner: No    Emotionally Abused: No    Physically Abused: No    Sexually Abused: No   Family History  Problem Relation Age of Onset   Hypertension Father       Review of  Systems  All other systems reviewed and are negative.      Objective:   Physical Exam Vitals reviewed.  Constitutional:      General: He is not in acute distress.    Appearance: He is well-developed. He is not diaphoretic.  HENT:     Head: Normocephalic and atraumatic.     Right Ear: External ear normal.     Left Ear: External ear normal.     Nose:  Nose normal.     Mouth/Throat:     Pharynx: No oropharyngeal exudate.  Eyes:     General: No scleral icterus.       Right eye: No discharge.        Left eye: No discharge.     Conjunctiva/sclera: Conjunctivae normal.     Pupils: Pupils are equal, round, and reactive to light.  Neck:     Thyroid: No thyromegaly.     Vascular: No JVD.     Trachea: No tracheal deviation.  Cardiovascular:     Rate and Rhythm: Normal rate. Rhythm irregular.     Heart sounds: Normal heart sounds. No murmur heard.    No friction rub. No gallop.  Pulmonary:     Effort: Pulmonary effort is normal. No respiratory distress.     Breath sounds: Normal breath sounds. No stridor. No wheezing or rales.  Chest:     Chest wall: No tenderness.  Abdominal:     General: Bowel sounds are normal. There is no distension.     Palpations: Abdomen is soft. There is no mass.     Tenderness: There is no abdominal tenderness. There is no guarding or rebound.  Musculoskeletal:        General: No tenderness or deformity. Normal range of motion.     Cervical back: Normal range of motion and neck supple.  Lymphadenopathy:     Cervical: No cervical adenopathy.  Skin:    General: Skin is warm.     Coloration: Skin is not pale.     Findings: No erythema or rash.  Neurological:     Mental Status: He is alert and oriented to person, place, and time.     Cranial Nerves: No cranial nerve deficit.     Motor: No abnormal muscle tone.     Coordination: Coordination normal.     Deep Tendon Reflexes: Reflexes are normal and symmetric.  Psychiatric:        Behavior: Behavior  normal.        Thought Content: Thought content normal.        Judgment: Judgment normal.           Assessment & Plan:  Diabetes mellitus, type II, insulin dependent (HCC) - Plan: Hemoglobin A1c, CBC with Differential/Platelet, COMPLETE METABOLIC PANEL WITH GFR, Lipid panel, Protein / Creatinine Ratio, Urine, HM Diabetes Foot Exam  Essential hypertension  Hyperlipidemia, unspecified hyperlipidemia type  Paroxysmal atrial fibrillation (HCC)  Prostate cancer screening - Plan: PSA  Benign prostatic hyperplasia without lower urinary tract symptoms - Plan: PSA Patient is in normal sinus rhythm today and he is appropriately anticoagulated with Eliquis.  I will check his A1c.  I suspect that he will be in the low 7 range.  I would like to add Ozempic 0.5 mg subcu weekly to try to facilitate additional weight loss and help with his glycemic management.  If he starts to see hypoglycemia, I recommended he reduce his Lantus by 10 units.  Diabetic foot exam is normal today.  Blood pressure is low.  Recommended he discontinue hydrochlorothiazide.  Patient was concerned about a spot on his back.  It is a light tan/brown macule.  It is less than 1 cm in diameter.  There are no concerning features.  I suspect that it is a solar lentigo

## 2023-03-06 LAB — COMPLETE METABOLIC PANEL WITH GFR
AG Ratio: 1.8 (calc) (ref 1.0–2.5)
ALT: 24 U/L (ref 9–46)
AST: 24 U/L (ref 10–35)
Albumin: 4.5 g/dL (ref 3.6–5.1)
Alkaline phosphatase (APISO): 75 U/L (ref 35–144)
BUN/Creatinine Ratio: 28 (calc) — ABNORMAL HIGH (ref 6–22)
BUN: 43 mg/dL — ABNORMAL HIGH (ref 7–25)
CO2: 26 mmol/L (ref 20–32)
Calcium: 9.6 mg/dL (ref 8.6–10.3)
Chloride: 104 mmol/L (ref 98–110)
Creat: 1.54 mg/dL — ABNORMAL HIGH (ref 0.70–1.28)
Globulin: 2.5 g/dL (calc) (ref 1.9–3.7)
Glucose, Bld: 151 mg/dL — ABNORMAL HIGH (ref 65–99)
Potassium: 4.5 mmol/L (ref 3.5–5.3)
Sodium: 140 mmol/L (ref 135–146)
Total Bilirubin: 0.5 mg/dL (ref 0.2–1.2)
Total Protein: 7 g/dL (ref 6.1–8.1)
eGFR: 48 mL/min/{1.73_m2} — ABNORMAL LOW (ref >=60)

## 2023-03-06 LAB — LIPID PANEL
Cholesterol: 107 mg/dL (ref <200)
HDL: 41 mg/dL (ref >=40)
LDL Cholesterol (Calc): 42 mg/dL (calc)
Non-HDL Cholesterol (Calc): 66 mg/dL (calc) (ref <130)
Total CHOL/HDL Ratio: 2.6 (calc) (ref <5.0)
Triglycerides: 158 mg/dL — ABNORMAL HIGH (ref <150)

## 2023-03-06 LAB — HEMOGLOBIN A1C
Hgb A1c MFr Bld: 7.6 % of total Hgb — ABNORMAL HIGH (ref <5.7)
Mean Plasma Glucose: 171 mg/dL
eAG (mmol/L): 9.5 mmol/L

## 2023-03-06 LAB — CBC WITH DIFFERENTIAL/PLATELET
Basophils Absolute: 102 cells/uL (ref 0–200)
Eosinophils Absolute: 241 cells/uL (ref 15–500)
Eosinophils Relative: 3.3 %
HCT: 43.6 % (ref 38.5–50.0)
MCHC: 33.3 g/dL (ref 32.0–36.0)
MPV: 11.4 fL (ref 7.5–12.5)
Platelets: 195 10*3/uL (ref 140–400)
RBC: 4.77 10*6/uL (ref 4.20–5.80)
RDW: 13 % (ref 11.0–15.0)
WBC: 7.3 10*3/uL (ref 3.8–10.8)

## 2023-03-06 LAB — PROTEIN / CREATININE RATIO, URINE
Creatinine, Urine: 53 mg/dL (ref 20–320)
Protein/Creat Ratio: 75 mg/g creat (ref 25–148)
Protein/Creatinine Ratio: 0.075 mg/mg creat (ref 0.025–0.148)
Total Protein, Urine: 4 mg/dL — ABNORMAL LOW (ref 5–25)

## 2023-03-06 LAB — PSA: PSA: 3.1 ng/mL (ref <=4.00)

## 2023-03-08 ENCOUNTER — Ambulatory Visit (INDEPENDENT_AMBULATORY_CARE_PROVIDER_SITE_OTHER): Payer: Medicare Other

## 2023-03-08 DIAGNOSIS — R55 Syncope and collapse: Secondary | ICD-10-CM | POA: Diagnosis not present

## 2023-03-08 LAB — CUP PACEART REMOTE DEVICE CHECK
Date Time Interrogation Session: 20240714230443
Implantable Pulse Generator Implant Date: 20221031

## 2023-03-10 ENCOUNTER — Telehealth: Payer: Self-pay

## 2023-03-10 NOTE — Telephone Encounter (Signed)
PA for Ozempic. Awaiting follow up questions. Mjp,lpn

## 2023-03-10 NOTE — Telephone Encounter (Signed)
Verlene Mayer (Key: [id number])  This request has been approved.  Please note any additional information provided by [facility] [place] at the bottom of your screen.

## 2023-03-10 NOTE — Telephone Encounter (Signed)
Remote report received for symptom activation:  Presenting rhythm with frequent ectopy.  Left message requesting call back to assess for symptoms.

## 2023-03-12 NOTE — Telephone Encounter (Signed)
Patient reports he is unable to recall any symptoms. Patient aware how to use symptom activator and encouraged when he uses to type a note in the comment box. Patient aware and verbalized understanding.

## 2023-03-19 NOTE — Progress Notes (Signed)
Carelink Summary Report / Loop Recorder 

## 2023-03-24 ENCOUNTER — Telehealth: Payer: Self-pay | Admitting: Internal Medicine

## 2023-03-24 ENCOUNTER — Encounter: Payer: Self-pay | Admitting: Internal Medicine

## 2023-03-24 NOTE — Telephone Encounter (Signed)
LVM 3x to schedule f/u with Dr. Rennis Golden, will send letter.

## 2023-03-24 NOTE — Telephone Encounter (Signed)
-----   Message from Nurse Richarda Osmond sent at 03/10/2023  2:57 PM EDT ----- Regarding: overdue for scheduled follow up with Dr. Rennis Golden Please schedule follow up.  Boneta Lucks RN

## 2023-04-12 ENCOUNTER — Ambulatory Visit (INDEPENDENT_AMBULATORY_CARE_PROVIDER_SITE_OTHER): Payer: Medicare Other

## 2023-04-12 DIAGNOSIS — R55 Syncope and collapse: Secondary | ICD-10-CM | POA: Diagnosis not present

## 2023-04-12 LAB — CUP PACEART REMOTE DEVICE CHECK
Date Time Interrogation Session: 20240818230706
Implantable Pulse Generator Implant Date: 20221031

## 2023-04-14 ENCOUNTER — Encounter: Payer: Self-pay | Admitting: Family Medicine

## 2023-04-15 ENCOUNTER — Other Ambulatory Visit: Payer: Self-pay

## 2023-04-15 DIAGNOSIS — E119 Type 2 diabetes mellitus without complications: Secondary | ICD-10-CM

## 2023-04-15 DIAGNOSIS — E785 Hyperlipidemia, unspecified: Secondary | ICD-10-CM

## 2023-04-15 MED ORDER — SEMAGLUTIDE (1 MG/DOSE) 4 MG/3ML ~~LOC~~ SOPN
1.0000 mg | PEN_INJECTOR | SUBCUTANEOUS | 1 refills | Status: DC
Start: 2023-04-15 — End: 2023-05-13

## 2023-04-22 NOTE — Progress Notes (Signed)
Carelink Summary Report / Loop Recorder 

## 2023-04-26 NOTE — Progress Notes (Unsigned)
Cardiology Clinic Note   Patient Name: Ronald Dorsey Date of Encounter: 04/28/2023  Primary Care Provider:  Donita Brooks, MD Primary Cardiologist:  Chrystie Nose, MD  Patient Profile    72 year old male with history of PAF, nonobstructive coronary artery disease on cardiac catheterization in January 2020, previous syncopal episode in 2022, ILR in situ), hypercholesterolemia, hypertension, type 2 diabetes.  Remains on Eliquis.  Last seen in the office on 10/23/2021.  They called our office on 02/12/2023 because he had an episode where he was incoherent lasting about 3 to 4 minutes.  ILR interrogation revealed no new clinically significant events, there were frequent PVCs representing roughly 67% of all recorded beats.  The patient's symptom triggered recording showed a period of ventricular bigeminy and frequent ventricular couplets.  Past Medical History    Past Medical History:  Diagnosis Date   Apnea    Arthritis    Diabetes mellitus without complication (HCC)    Hypercholesterolemia    Hypertension    Neuromuscular disorder (HCC)    sciatica   Paroxysmal atrial fibrillation (HCC)    PSVT (paroxysmal supraventricular tachycardia)    Past Surgical History:  Procedure Laterality Date   CARPAL TUNNEL RELEASE  2010   left wrist   EYE SURGERY     LEFT HEART CATH AND CORONARY ANGIOGRAPHY N/A 09/23/2018   Procedure: LEFT HEART CATH AND CORONARY ANGIOGRAPHY;  Surgeon: Kathleene Hazel, MD;  Location: [facility];  Service: Cardiovascular;  Laterality: N/A;   TONSILLECTOMY  1961    Allergies  Allergies  Allergen Reactions   Egg-Derived Products Nausea And Vomiting    History of Present Illness    Mr. Spilker, returns to the office today to discuss ILR results developed concerning his syncopal episodes.  Per review by Dr. Royann Shivers who reviewed ILR interrogati on 04/23/2023 a slow and steady reduction in sinus rate followed by second-degree AV block sinus arrest  and junctional escape suggestive of a vagal event.  The patient is to discuss pacemaker therapy today.  Primary question this patient seen on past.   Home Medications    Current Outpatient Medications  Medication Sig Dispense Refill   amLODipine (NORVASC) 10 MG tablet TAKE 1 TABLET (10 MG TOTAL) BY MOUTH DAILY. PT NEED CPE APPT W/PCP FOR FUTURE REFILLS 90 tablet 1   BD INSULIN SYRINGE U/F 31G X 5/16" 1 ML MISC USE AS DIRECTED TO INJECT INSULIN 5 TIMES DAILY. DX:E11.65.     Continuous Blood Gluc Sensor (FREESTYLE LIBRE 14 DAY SENSOR) MISC Use as directed to monitor blood glucose continuously. Replace sensor Q14 days. Dx: e11.9 2 each 11   ELIQUIS 5 MG TABS tablet TAKE 1 TABLET BY MOUTH TWICE A DAY 60 tablet 5   ezetimibe (ZETIA) 10 MG tablet TAKE 1 TABLET BY MOUTH EVERY DAY 90 tablet 3   HUMALOG 100 UNIT/ML injection INJECT 0-30 UNITS INTO THE SKIN 3X DAILY WITH MEALS. PER SLIDING SCALE. MAX DAILY DOSE 90U 80 mL 0   Insulin Syringes, Disposable, (B-D INSULIN SYRINGE 1CC) U-100 1 ML MISC Use as directed to inject insulin 5x daily. Dx:E11.65. 500 each 12   JARDIANCE 25 MG TABS tablet TAKE 1 TABLET BY MOUTH EVERY DAY BEFORE BREAKFAST 90 tablet 3   LANTUS 100 UNIT/ML injection INJECT 50 UNITS INTO THE SKIN 2 TIMES DAILY. 90 mL 0   lisinopril (ZESTRIL) 20 MG tablet Take 20 mg by mouth daily. Take 1 Tablet Daily     metFORMIN (GLUCOPHAGE)  500 MG tablet TAKE 2 TABLETS BY MOUTH TWICE A DAY 360 tablet 0   metoprolol succinate (TOPROL-XL) 25 MG 24 hr tablet TAKE 1 TABLET BY MOUTH EVERY DAY 90 tablet 3   omega-3 acid ethyl esters (LOVAZA) 1 g capsule TAKE 2 CAPSULES BY MOUTH TWICE A DAY 360 capsule 1   pioglitazone (ACTOS) 30 MG tablet TAKE 1 TABLET BY MOUTH EVERY DAY BEFORE BREAKFAST 30 tablet 0   pregabalin (LYRICA) 150 MG capsule TAKE 1 CAPSULE BY MOUTH TWICE A DAY 180 capsule 3   rosuvastatin (CRESTOR) 20 MG tablet TAKE 1 TABLET BY MOUTH EVERY DAY 90 tablet 0   Semaglutide, 1 MG/DOSE, 4 MG/3ML SOPN  Inject 1 mg as directed once a week. 3 mL 1   Current Facility-Administered Medications  Medication Dose Route Frequency Provider Last Rate Last Admin   lidocaine-EPINEPHrine (XYLOCAINE-EPINEPHrine) 1 %-1:200000 (PF) injection 10 mL  10 mL Infiltration Once Croitoru, Mihai, MD         Family History    Family History  Problem Relation Age of Onset   Hypertension Father    He indicated that the status of his father is unknown.  Social History    Social History   Socioeconomic History   Marital status: Married    Spouse name: Not on file   Number of children: Not on file   Years of education: Not on file   Highest education level: Not on file  Occupational History   Not on file  Tobacco Use   Smoking status: Former   Smokeless tobacco: Former  Substance and Sexual Activity   Alcohol use: Yes    Comment: 1 glass of wine a month or less   Drug use: No   Sexual activity: Yes    Comment: married, works in Nurse, learning disability business  Other Topics Concern   Not on file  Social History Narrative   Not on file   Social Determinants of Health   Financial Resource Strain: Low Risk  (08/27/2022)   Overall Financial Resource Strain (CARDIA)    Difficulty of Paying Living Expenses: Not hard at all  Food Insecurity: No Food Insecurity (08/27/2022)   Hunger Vital Sign    Worried About Running Out of Food in the Last Year: Never true    Ran Out of Food in the Last Year: Never true  Transportation Needs: No Transportation Needs (08/27/2022)   PRAPARE - Administrator, Civil Service (Medical): No    Lack of Transportation (Non-Medical): No  Physical Activity: Inactive (08/27/2022)   Exercise Vital Sign    Days of Exercise per Week: 0 days    Minutes of Exercise per Session: 0 min  Stress: No Stress Concern Present (08/27/2022)   [employer] of Occupational Health - Occupational Stress Questionnaire    Feeling of Stress : Not at all  Social Connections: Socially Integrated  (08/27/2022)   Social Connection and Isolation Panel [NHANES]    Frequency of Communication with Friends and Family: More than three times a week    Frequency of Social Gatherings with Friends and Family: Three times a week    Attends Religious Services: More than 4 times per year    Active Member of Clubs or Organizations: Yes    Attends Banker Meetings: More than 4 times per year    Marital Status: Married  Catering manager Violence: Not At Risk (08/27/2022)   Humiliation, Afraid, Rape, and Kick questionnaire    Fear of Current or  Ex-Partner: No    Emotionally Abused: No    Physically Abused: No    Sexually Abused: No     Review of Systems    General:  No chills, fever, night sweats positive for 60 pound intentional weight loss   Cardiovascular:  No chest pain, dyspnea on exertion, edema, orthopnea, palpitations, paroxysmal nocturnal dyspnea. Dermatological: No rash, lesions/masses, multiple areas of bruising from Eliquis Respiratory: No cough, dyspnea Urologic: No hematuria, dysuria Abdominal:   No nausea, vomiting, diarrhea, bright red blood per rectum, melena, or hematemesis Neurologic:  No visual changes, wkns, changes in mental status. All other systems reviewed and are otherwise negative except as noted above.       Physical Exam    VS:  BP (!) 108/56   Pulse 61   Ht 6\' 1"  (1.854 m)   Wt 262 lb 6.4 oz (119 kg)   BMI 34.62 kg/m  , BMI Body mass index is 34.62 kg/m.     GEN: Well nourished, well developed, in no acute distress. HEENT: normal. Neck: Supple, no JVD, carotid bruits, or masses. Cardiac: RRR, bradycardic, no murmurs, rubs, or gallops. No clubbing, cyanosis, edema.  Radials/DP/PT 2+ and equal bilaterally.  Respiratory:  Respirations regular and unlabored, clear to auscultation bilaterally. GI: Soft, nontender, nondistended, BS + x 4. MS: no deformity or atrophy. Skin: warm and dry, no rash. Neuro:  Strength and sensation are intact. Psych:  Normal affect.      Lab Results  Component Value Date   WBC 7.3 03/05/2023   HGB 14.5 03/05/2023   HCT 43.6 03/05/2023   MCV 91.4 03/05/2023   PLT 195 03/05/2023   Lab Results  Component Value Date   CREATININE 1.54 (H) 03/05/2023   BUN 43 (H) 03/05/2023   NA 140 03/05/2023   K 4.5 03/05/2023   CL 104 03/05/2023   CO2 26 03/05/2023   Lab Results  Component Value Date   ALT 24 03/05/2023   AST 24 03/05/2023   ALKPHOS 43 09/24/2018   BILITOT 0.5 03/05/2023   Lab Results  Component Value Date   CHOL 107 03/05/2023   HDL 41 03/05/2023   LDLCALC 42 03/05/2023   TRIG 158 (H) 03/05/2023   CHOLHDL 2.6 03/05/2023    Lab Results  Component Value Date   HGBA1C 7.6 (H) 03/05/2023     Review of Prior Studies    ILR Interrogation 04/12/2023 ILR summary report received. Battery status OK. Normal device function. No new, tachy, brady, or pause episodes. AT/AF episodes per trends below detection, burden 0%.  Hx of PAF, Eliquis per PA report. Monthly summary reports and ROV/PRN,  1  symptom activation's, SR with frequent ectopy  LA, CVRS   Assessment & Plan   1.  Vasovagal syncope: The patient has been seen by Dr. Royann Shivers and had a lengthy conversation concerning mechanism, pathophysiology, and symptoms.  He is advised to immediately lie down on the ground and lay flat or keep his legs elevated when this occurs to prevent syncope.  He does have clear warning prior to this happening and he is advised to pay attention to this and do it he can to reduce his incidence of falling or passing out.  He is to report any further syncopal episodes.  2.  Hypertension: The patient medications will be adjusted as he is hypotensive today.  Will discontinue HCTZ 25 mg daily.  Will also at decrease lisinopril from 40 mg daily to 20 mg daily.  He is  to take his blood pressure daily at the same time every day and record of this.  He is also to write down his heart rate.  He is to notify us if he  loses another 15 pounds which would then cause this to continue to decrease his antihypertensive medications.  The next medication I would consider decreasing the amlodipine from 10 mg to 5 mg.  3.  Insulin-dependent diabetes: Followed by primary care provider.  Continue to monitor labs and blood sugars throughout the day.  4.  Paroxysmal atrial fibrillation: Patient is questioning whether or not he needs to continue Eliquis as he has been on it for 2 years.  I will discuss this with Dr. Royann Shivers as he has been reading over ILR reports to evaluate whether he is having PAF or not and need to continue anticoagulation.  I reviewed his prior ILR recordings for the last year and there was no evidence of atrial fibrillation.  On review of chart with Dr. Haskell Riling it turns out that he had had atrial fibrillation during cardiac catheterization and was placed on Eliquis postcardiac catheterization in 2020.   I did discuss this with Dr. Rennis Golden who is onsite at [facility] today and he is agreed that he can stop his Eliquis for now.  Will continue to monitor the ILR reports.  Lengthy appointment including Dr. Royann Shivers and myself discussing his ILR monitor with education and instructions lasting >45 minutes.       Signed, Bettey Mare. Liborio Nixon, ANP, AACC   04/28/2023 8:39 AM      Office [phone] Fax [phone]  Notice: This dictation was prepared with Dragon dictation along with smaller phrase technology. Any transcriptional errors that result from this process are unintentional and may not be corrected upon review.

## 2023-04-27 ENCOUNTER — Telehealth: Payer: Self-pay | Admitting: Family Medicine

## 2023-04-27 ENCOUNTER — Telehealth: Payer: Self-pay

## 2023-04-27 DIAGNOSIS — E119 Type 2 diabetes mellitus without complications: Secondary | ICD-10-CM

## 2023-04-27 MED ORDER — PIOGLITAZONE HCL 30 MG PO TABS
ORAL_TABLET | ORAL | 0 refills | Status: DC
Start: 2023-04-27 — End: 2023-07-27

## 2023-04-27 NOTE — Telephone Encounter (Signed)
Slow steady reduction in sinus rate followed by second degree AV block, sinus arrest and junctional escape rhythm suggest a vagal event, but need to discuss possible pacemaker therapy.

## 2023-04-27 NOTE — Telephone Encounter (Signed)
Prescription Request  04/27/2023  LOV: 03/05/2023  What is the name of the medication or equipment?   pioglitazone (ACTOS) 30 MG tablet  ((90 day script requested**   Have you contacted your pharmacy to request a refill? Yes   Which pharmacy would you like this sent to?    [id number] - [place], [place] - [address] AT [address] OF [facility] [address] [place] [address] Phone: [phone] Fax: [phone]   Patient notified that their request is being sent to the clinical staff for review and that they should receive a response within 2 business days.   Please advise pharmacist.

## 2023-04-27 NOTE — Telephone Encounter (Signed)
Called and updated wife.

## 2023-04-27 NOTE — Telephone Encounter (Signed)
Following alert received from [employer] received for ILR pause 8/30/2 @ 16:41, 6 sec in duration per device.   Attempted to contact patient to assess. No answer, LMTCB.   Spoke to patients wife, reports patients eye rolled back, grey, diaphoretic and non-responsive during that time. States after a few minutes (estimate 5-8 minutes) patient will become alert and aware of location. BP was "very low" per wife.   [place] beach and [place] home in 1 hour.   Routing to Dr. Ernest Pine to advise further.   Patient is currently feeling fine at the moment. ED precautions given for future syncope events.

## 2023-04-28 ENCOUNTER — Ambulatory Visit: Payer: Medicare Other | Attending: Adult Health | Admitting: Adult Health

## 2023-04-28 ENCOUNTER — Encounter: Payer: Self-pay | Admitting: Adult Health

## 2023-04-28 VITALS — BP 108/56 | HR 61 | Ht 73.0 in | Wt 262.4 lb

## 2023-04-28 DIAGNOSIS — I10 Essential (primary) hypertension: Secondary | ICD-10-CM | POA: Diagnosis not present

## 2023-04-28 DIAGNOSIS — I480 Paroxysmal atrial fibrillation: Secondary | ICD-10-CM

## 2023-04-28 DIAGNOSIS — R55 Syncope and collapse: Secondary | ICD-10-CM | POA: Diagnosis not present

## 2023-04-28 DIAGNOSIS — Z794 Long term (current) use of insulin: Secondary | ICD-10-CM

## 2023-04-28 DIAGNOSIS — E119 Type 2 diabetes mellitus without complications: Secondary | ICD-10-CM

## 2023-04-28 NOTE — Patient Instructions (Signed)
Medication Instructions:  Stop hydrochlorothiazide . Decrease Lisinopril to 20 mg ( take 1Tablet Daily) *If you need a refill on your cardiac medications before your next appointment, please call your pharmacy*   Lab Work: No Labs If you have labs (blood work) drawn today and your tests are completely normal, you will receive your results only by: MyChart Message (if you have MyChart) OR A paper copy in the mail If you have any lab test that is abnormal or we need to change your treatment, we will call you to review the results.   Testing/Procedures: No Testing   Follow-Up: At [facility], you and your health needs are our priority.  As part of our continuing mission to provide you with exceptional heart care, we have created designated Provider Care Teams.  These Care Teams include your primary Cardiologist (physician) and Advanced Practice Providers (APPs -  Physician Assistants and Nurse Practitioners) who all work together to provide you with the care you need, when you need it.  We recommend signing up for the patient portal called "MyChart".  Sign up information is provided on this After Visit Summary.  MyChart is used to connect with patients for Virtual Visits (Telemedicine).  Patients are able to view lab/test results, encounter notes, upcoming appointments, etc.  Non-urgent messages can be sent to your provider as well.   To learn more about what you can do with MyChart, go to [email or web address].    Your next appointment:   1 month(s)  Provider:   Joni Reining, DNP, ANP Other Instructions Take Blood Pressure Daily. If Experience [employer].

## 2023-05-01 ENCOUNTER — Other Ambulatory Visit: Payer: Self-pay | Admitting: Family Medicine

## 2023-05-03 NOTE — Telephone Encounter (Signed)
Prescription Request  05/03/2023  LOV: 03/05/2023  What is the name of the medication or equipment? amLODipine (NORVASC) 10 MG tablet [[id number]]  Have you contacted your pharmacy to request a refill? Yes   Which pharmacy would you like this sent to?  [id number] - [place], [place] - [address] AT [facility] [address] [place] [address] Phone: [phone]  Fax:    Patient notified that their request is being sent to the clinical staff for review and that they should receive a response within 2 business days.   Please advise at [facility] [phone]

## 2023-05-03 NOTE — Telephone Encounter (Signed)
Requested Prescriptions  Pending Prescriptions Disp Refills   empagliflozin (JARDIANCE) 25 MG TABS tablet [Pharmacy Med Name: JARDIANCE 25 MG TABLET] 90 tablet 1    Sig: TAKE 1 TABLET BY MOUTH EVERY DAY BEFORE BREAKFAST     Endocrinology:  Diabetes - SGLT2 Inhibitors Failed - 05/03/2023 12:51 PM      Failed - Cr in normal range and within 360 days    Creat  Date Value Ref Range Status  03/05/2023 1.54 (H) 0.70 - 1.28 mg/dL Final   Creatinine, Urine  Date Value Ref Range Status  03/05/2023 53 20 - 320 mg/dL Final         Failed - eGFR in normal range and within 360 days    GFR, Est African American  Date Value Ref Range Status  11/25/2020 89 > OR = 60 mL/min/1.2m2 Final   GFR, Est Non African American  Date Value Ref Range Status  11/25/2020 76 > OR = 60 mL/min/1.6m2 Final   GFR, Estimated  Date Value Ref Range Status  04/06/2021 >60 >60 mL/min Final    Comment:    (NOTE) Calculated using the CKD-EPI Creatinine Equation (2021)    eGFR  Date Value Ref Range Status  03/05/2023 48 (L) > OR = 60 mL/min/1.63m2 Final  01/19/2023 47 (L) >59 mL/min/1.73 Final         Failed - Valid encounter within last 6 months    Recent Outpatient Visits           2 years ago Diabetes mellitus, type II, insulin dependent (HCC)   [facility] Pick[facility], Priscille Heidelberg, MD   2 years ago Diabetes mellitus, type II, insulin dependent (HCC)   [facility] Donita Brooks, MD   3 years ago Diabetes mellitus, type II, insulin dependent (HCC)   [facility] Donita Brooks, MD   3 years ago Renal insufficiency   [facility] Donita Brooks, MD   3 years ago Diabetes mellitus, type II, insulin dependent (HCC)   [facility] Pickard, Priscille Heidelberg, MD       Future Appointments             In 1 month Lyman Bishop Bettey Mare, NP [place] [facility] - HBA1C is between 0 and 7.9  and within 180 days    Hgb A1c MFr Bld  Date Value Ref Range Status  03/05/2023 7.6 (H) <5.7 % of total Hgb Final    Comment:    For someone without known diabetes, a hemoglobin A1c value of 6.5% or greater indicates that they may have  diabetes and this should be confirmed with a follow-up  test. . For someone with known diabetes, a value <7% indicates  that their diabetes is well controlled and a value  greater than or equal to 7% indicates suboptimal  control. A1c targets should be individualized based on  duration of diabetes, age, comorbid conditions, and  other considerations. . Currently, no consensus exists regarding use of hemoglobin A1c for diagnosis of diabetes for children. .           metoprolol succinate (TOPROL-XL) 25 MG 24 hr tablet [Pharmacy Med Name: METOPROLOL SUCC ER 25 MG TAB] 90 tablet 1    Sig: TAKE 1 TABLET BY MOUTH EVERY DAY     Cardiovascular:  Beta Blockers Failed - 05/03/2023 12:51 PM  Failed - Valid encounter within last 6 months    Recent Outpatient Visits           2 years ago Diabetes mellitus, type II, insulin dependent (HCC)   [facility] Donita Brooks, MD   2 years ago Diabetes mellitus, type II, insulin dependent (HCC)   [facility] Donita Brooks, MD   3 years ago Diabetes mellitus, type II, insulin dependent (HCC)   [facility] Pickard, Priscille Heidelberg, MD   3 years ago Renal insufficiency   [facility] Donita Brooks, MD   3 years ago Diabetes mellitus, type II, insulin dependent (HCC)   [facility] Pickard, Priscille Heidelberg, MD       Future Appointments             In 1 month Jodelle Gross, NP  [facility] - Last BP in normal range    BP Readings from Last 1 Encounters:  04/28/23 (!) 108/56         Passed - Last Heart Rate in normal range    Pulse Readings from Last 1 Encounters:   04/28/23 61          ezetimibe (ZETIA) 10 MG tablet [Pharmacy Med Name: EZETIMIBE 10 MG TABLET] 90 tablet 1    Sig: TAKE 1 TABLET BY MOUTH EVERY DAY     Cardiovascular:  Antilipid - Sterol Transport Inhibitors Failed - 05/03/2023 12:51 PM      Failed - Valid encounter within last 12 months    Recent Outpatient Visits           2 years ago Diabetes mellitus, type II, insulin dependent (HCC)   [facility] Pick[facility], Priscille Heidelberg, MD   2 years ago Diabetes mellitus, type II, insulin dependent (HCC)   [facility] Donita Brooks, MD   3 years ago Diabetes mellitus, type II, insulin dependent (HCC)   [facility] Pickard, Priscille Heidelberg, MD   3 years ago Renal insufficiency   [facility] Donita Brooks, MD   3 years ago Diabetes mellitus, type II, insulin dependent (HCC)   [facility] Pickard, Priscille Heidelberg, MD       Future Appointments             In 1 month Jodelle Gross, NP [place] [facility]            Failed - Lipid Panel in normal range within the last 12 months    Cholesterol  Date Value Ref Range Status  03/05/2023 107 <200 mg/dL Final   LDL Cholesterol (Calc)  Date Value Ref Range Status  03/05/2023 42 mg/dL (calc) Final    Comment:    Reference range: <100 . Desirable range <100 mg/dL for primary prevention;   <70 mg/dL for patients with CHD or diabetic patients  with > or = 2 CHD risk factors. Marland Kitchen LDL-C is now calculated using the Martin-Hopkins  calculation, which is a validated novel method providing  better accuracy than the Friedewald equation in the  estimation of LDL-C.  Horald Pollen et al. Lenox Ahr. 3818;299(37): 2061-2068  (http://education.Quest[employer].com/faq/FAQ164)    HDL  Date Value Ref Range Status  03/05/2023 41 > OR = 40 mg/dL Final   Triglycerides  Date Value Ref Range Status  03/05/2023  158 (H) <150 mg/dL Final         Passed - AST  in normal range and within 360 days    AST  Date Value Ref Range Status  03/05/2023 24 10 - 35 U/L Final         Passed - ALT in normal range and within 360 days    ALT  Date Value Ref Range Status  03/05/2023 24 9 - 46 U/L Final         Passed - Patient is not pregnant      Refused Prescriptions Disp Refills   hydrochlorothiazide (HYDRODIURIL) 25 MG tablet [Pharmacy Med Name: HYDROCHLOROTHIAZIDE 25 MG TAB] 90 tablet 3    Sig: TAKE 1 TABLET BY MOUTH EVERY DAY     Cardiovascular: Diuretics - Thiazide Failed - 05/03/2023 12:51 PM      Failed - Cr in normal range and within 180 days    Creat  Date Value Ref Range Status  03/05/2023 1.54 (H) 0.70 - 1.28 mg/dL Final   Creatinine, Urine  Date Value Ref Range Status  03/05/2023 53 20 - 320 mg/dL Final         Failed - Valid encounter within last 6 months    Recent Outpatient Visits           2 years ago Diabetes mellitus, type II, insulin dependent (HCC)   [facility] Donita Brooks, MD   2 years ago Diabetes mellitus, type II, insulin dependent (HCC)   [facility] Donita Brooks, MD   3 years ago Diabetes mellitus, type II, insulin dependent (HCC)   [facility] Pickard, Priscille Heidelberg, MD   3 years ago Renal insufficiency   [facility] Donita Brooks, MD   3 years ago Diabetes mellitus, type II, insulin dependent (HCC)   [facility] Pickard, Priscille Heidelberg, MD       Future Appointments             In 1 month Lyman Bishop Bettey Mare, NP [place] [facility] - K in normal range and within 180 days    Potassium  Date Value Ref Range Status  03/05/2023 4.5 3.5 - 5.3 mmol/L Final         Passed - Na in normal range and within 180 days    Sodium  Date Value Ref Range Status  03/05/2023 140 135 - 146 mmol/L Final  01/19/2023 138 134 - 144 mmol/L Final         Passed - Last BP in normal range     BP Readings from Last 1 Encounters:  04/28/23 (!) 108/56

## 2023-05-05 NOTE — Telephone Encounter (Signed)
  Pharmacy requesting refills of   empagliflozin (JARDIANCE) 25 MG TABS tablet [[id number]]   amLODipine (NORVASC) 10 MG tablet **90 day script requested**  Pharmacy:  [id number] - [place], [place] - [address] AT [facility] [address] [facility] [address] [place] [address] Phone: [phone]  Fax: [phone] DEA #: [id number]   **LOV 03/05/23**  Please advise pharmacist.

## 2023-05-10 ENCOUNTER — Other Ambulatory Visit: Payer: Self-pay | Admitting: Family Medicine

## 2023-05-11 NOTE — Telephone Encounter (Signed)
Due to a system glitch the last office visit for this practice is not detected correctly.   The LOV 03/05/2023 and labs are in date.  Requested Prescriptions  Pending Prescriptions Disp Refills   LANTUS 100 UNIT/ML injection [Pharmacy Med Name: LANTUS 100 UNIT/ML VIAL] 90 mL 0    Sig: INJECT 50 UNITS UNDER THE SKIN TWICE A DAY     Endocrinology:  Diabetes - Insulins Failed - 05/10/2023  1:10 AM      Failed - Valid encounter within last 6 months    Recent Outpatient Visits           2 years ago Diabetes mellitus, type II, insulin dependent (HCC)   [facility], Priscille Heidelberg, MD   2 years ago Diabetes mellitus, type II, insulin dependent (HCC)   [facility] Pickard, Priscille Heidelberg, MD   3 years ago Diabetes mellitus, type II, insulin dependent (HCC)   [facility] Pickard, Priscille Heidelberg, MD   3 years ago Renal insufficiency   [facility] Donita Brooks, MD   3 years ago Diabetes mellitus, type II, insulin dependent (HCC)   [facility] Pickard, Priscille Heidelberg, MD       Future Appointments             In 3 weeks Jodelle Gross, NP [place] [facility] - HBA1C is between 0 and 7.9 and within 180 days    Hgb A1c MFr Bld  Date Value Ref Range Status  03/05/2023 7.6 (H) <5.7 % of total Hgb Final    Comment:    For someone without known diabetes, a hemoglobin A1c value of 6.5% or greater indicates that they may have  diabetes and this should be confirmed with a follow-up  test. . For someone with known diabetes, a value <7% indicates  that their diabetes is well controlled and a value  greater than or equal to 7% indicates suboptimal  control. A1c targets should be individualized based on  duration of diabetes, age, comorbid conditions, and  other considerations. . Currently, no consensus exists regarding use of hemoglobin A1c for diagnosis of diabetes for  children. Marland Kitchen

## 2023-05-12 ENCOUNTER — Other Ambulatory Visit: Payer: Self-pay | Admitting: Family Medicine

## 2023-05-13 ENCOUNTER — Other Ambulatory Visit: Payer: Self-pay

## 2023-05-13 DIAGNOSIS — E119 Type 2 diabetes mellitus without complications: Secondary | ICD-10-CM

## 2023-05-13 DIAGNOSIS — E7800 Pure hypercholesterolemia, unspecified: Secondary | ICD-10-CM

## 2023-05-13 MED ORDER — SEMAGLUTIDE (2 MG/DOSE) 8 MG/3ML ~~LOC~~ SOPN
2.0000 mg | PEN_INJECTOR | SUBCUTANEOUS | 3 refills | Status: DC
Start: 2023-05-13 — End: 2023-08-27

## 2023-05-14 NOTE — Telephone Encounter (Signed)
Requested Prescriptions  Pending Prescriptions Disp Refills   ELIQUIS 5 MG TABS tablet [Pharmacy Med Name: ELIQUIS 5 MG TABLET] 60 tablet 5    Sig: TAKE 1 TABLET BY MOUTH TWICE A DAY     Hematology:  Anticoagulants - apixaban Failed - 05/12/2023  8:58 PM      Failed - Cr in normal range and within 360 days    Creat  Date Value Ref Range Status  03/05/2023 1.54 (H) 0.70 - 1.28 mg/dL Final   Creatinine, Urine  Date Value Ref Range Status  03/05/2023 53 20 - 320 mg/dL Final         Failed - Valid encounter within last 12 months    Recent Outpatient Visits           2 years ago Diabetes mellitus, type II, insulin dependent (HCC)   [facility] Donita Brooks, MD   2 years ago Diabetes mellitus, type II, insulin dependent (HCC)   [facility] Donita Brooks, MD   3 years ago Diabetes mellitus, type II, insulin dependent (HCC)   [facility] Pickard, Priscille Heidelberg, MD   3 years ago Renal insufficiency   [facility] Donita Brooks, MD   3 years ago Diabetes mellitus, type II, insulin dependent (HCC)   [facility] Pickard, Priscille Heidelberg, MD       Future Appointments             In 2 weeks Jodelle Gross, NP [place] [facility] at [facility] - PLT in normal range and within 360 days    Platelets  Date Value Ref Range Status  03/05/2023 195 140 - 400 Thousand/uL Final         Passed - HGB in normal range and within 360 days    Hemoglobin  Date Value Ref Range Status  03/05/2023 14.5 13.2 - 17.1 g/dL Final         Passed - HCT in normal range and within 360 days    HCT  Date Value Ref Range Status  03/05/2023 43.6 38.5 - 50.0 % Final         Passed - AST in normal range and within 360 days    AST  Date Value Ref Range Status  03/05/2023 24 10 - 35 U/L Final         Passed - ALT in normal range and within 360 days    ALT  Date Value Ref Range Status   03/05/2023 24 9 - 46 U/L Final          lisinopril (ZESTRIL) 20 MG tablet [Pharmacy Med Name: LISINOPRIL 20 MG TABLET] 180 tablet     Sig: TAKE 2 TABLETS (40 MG TOTAL) BY MOUTH DAILY.     Cardiovascular:  ACE Inhibitors Failed - 05/12/2023  8:58 PM      Failed - Cr in normal range and within 180 days    Creat  Date Value Ref Range Status  03/05/2023 1.54 (H) 0.70 - 1.28 mg/dL Final   Creatinine, Urine  Date Value Ref Range Status  03/05/2023 53 20 - 320 mg/dL Final         Failed - Valid encounter within last 6 months    Recent Outpatient Visits           2 years ago Diabetes mellitus, type II, insulin dependent (HCC)  [facility] Pickard, Priscille Heidelberg, MD   2 years ago Diabetes mellitus, type II, insulin dependent (HCC)   [facility] Donita Brooks, MD   3 years ago Diabetes mellitus, type II, insulin dependent (HCC)   [facility] Pickard, Priscille Heidelberg, MD   3 years ago Renal insufficiency   [facility] Donita [facility], MD   3 years ago Diabetes mellitus, type II, insulin dependent (HCC)   [facility] Donita Brooks, MD       Future Appointments             In 2 weeks Jodelle Gross, NP [place] [facility] - K in normal range and within 180 days    Potassium  Date Value Ref Range Status  03/05/2023 4.5 3.5 - 5.3 mmol/L Final         Passed - Patient is not pregnant      Passed - Last BP in normal range    BP Readings from Last 1 Encounters:  04/28/23 (!) 108/56          HUMALOG 100 UNIT/ML injection [Pharmacy Med Name: HUMALOG 100 UNIT/ML VIAL] 80 mL 0    Sig: INJECT 0-30 UNITS INTO THE SKIN 3X DAILY WITH MEALS. PER SLIDING SCALE. MAX DAILY DOSE 90U     Endocrinology:  Diabetes - Insulins Failed - 05/12/2023  8:58 PM      Failed - Valid encounter within last 6 months    Recent Outpatient Visits           2 years ago  Diabetes mellitus, type II, insulin dependent (HCC)   [facility] Pickard, Priscille Heidelberg, MD   2 years ago Diabetes mellitus, type II, insulin dependent (HCC)   [facility] Pickard, Priscille Heidelberg, MD   3 years ago Diabetes mellitus, type II, insulin dependent (HCC)   [facility] Pickard, Priscille Heidelberg, MD   3 years ago Renal insufficiency   [facility], MD   3 years ago Diabetes mellitus, type II, insulin dependent (HCC)   [facility] Pickard, Priscille Heidelberg, MD       Future Appointments             In 2 weeks Jodelle Gross, NP [place] [facility] - HBA1C is between 0 and 7.9 and within 180 days    Hgb A1c MFr Bld  Date Value Ref Range Status  03/05/2023 7.6 (H) <5.7 % of total Hgb Final    Comment:    For someone without known diabetes, a hemoglobin A1c value of 6.5% or greater indicates that they may have  diabetes and this should be confirmed with a follow-up  test. . For someone with known diabetes, a value <7% indicates  that their diabetes is well controlled and a value  greater than or equal to 7% indicates suboptimal  control. A1c targets should be individualized based on  duration of diabetes, age, comorbid conditions, and  other considerations. . Currently, no consensus exists regarding use of hemoglobin A1c for diagnosis of diabetes for children. Marland Kitchen

## 2023-05-14 NOTE — Telephone Encounter (Signed)
Requested medications are due for refill today.  unsure  Requested medications are on the active medications list.  yes  Last refill. 04/28/2023  Future visit scheduled.   no  Notes to clinic.  Medication is historical.    Requested Prescriptions  Pending Prescriptions Disp Refills   lisinopril (ZESTRIL) 20 MG tablet [Pharmacy Med Name: LISINOPRIL 20 MG TABLET] 180 tablet     Sig: TAKE 2 TABLETS (40 MG TOTAL) BY MOUTH DAILY.     Cardiovascular:  ACE Inhibitors Failed - 05/12/2023  8:58 PM      Failed - Cr in normal range and within 180 days    Creat  Date Value Ref Range Status  03/05/2023 1.54 (H) 0.70 - 1.28 mg/dL Final   Creatinine, Urine  Date Value Ref Range Status  03/05/2023 53 20 - 320 mg/dL Final         Failed - Valid encounter within last 6 months    Recent Outpatient Visits           2 years ago Diabetes mellitus, type II, insulin dependent (HCC)   [facility] Donita Brooks, MD   2 years ago Diabetes mellitus, type II, insulin dependent (HCC)   [facility] Donita Brooks, MD   3 years ago Diabetes mellitus, type II, insulin dependent (HCC)   [facility] Pickard, Priscille Heidelberg, MD   3 years ago Renal insufficiency   [facility] Donita Brooks, MD   3 years ago Diabetes mellitus, type II, insulin dependent (HCC)   [facility] Pickard, Priscille Heidelberg, MD       Future Appointments             In 2 weeks Jodelle Gross, NP  [facility] at [facility] - K in normal range and within 180 days    Potassium  Date Value Ref Range Status  03/05/2023 4.5 3.5 - 5.3 mmol/L Final         Passed - Patient is not pregnant      Passed - Last BP in normal range    BP Readings from Last 1 Encounters:  04/28/23 (!) 108/56         Signed Prescriptions Disp Refills   ELIQUIS 5 MG TABS tablet 60 tablet 5    Sig: TAKE 1 TABLET BY MOUTH TWICE A  DAY     Hematology:  Anticoagulants - apixaban Failed - 05/12/2023  8:58 PM      Failed - Cr in normal range and within 360 days    Creat  Date Value Ref Range Status  03/05/2023 1.54 (H) 0.70 - 1.28 mg/dL Final   Creatinine, Urine  Date Value Ref Range Status  03/05/2023 53 20 - 320 mg/dL Final         Failed - Valid encounter within last 12 months    Recent Outpatient Visits           2 years ago Diabetes mellitus, type II, insulin dependent (HCC)   [facility] Donita Brooks, MD   2 years ago Diabetes mellitus, type II, insulin dependent (HCC)   [facility] Donita Brooks, MD   3 years ago Diabetes mellitus, type II, insulin dependent (HCC)   [facility] Pickard, Priscille Heidelberg, MD   3 years ago Renal insufficiency   [facility] Donita Brooks, MD   3 years ago Diabetes mellitus, type II, insulin dependent (HCC)   [facility] Pickard, Priscille Heidelberg, MD       Future Appointments             In 2 weeks Jodelle Gross, NP [place] [facility] at [facility] - PLT in normal range and within 360 days    Platelets  Date Value Ref Range Status  03/05/2023 195 140 - 400 Thousand/uL Final         Passed - HGB in normal range and within 360 days    Hemoglobin  Date Value Ref Range Status  03/05/2023 14.5 13.2 - 17.1 g/dL Final         Passed - HCT in normal range and within 360 days    HCT  Date Value Ref Range Status  03/05/2023 43.6 38.5 - 50.0 % Final         Passed - AST in normal range and within 360 days    AST  Date Value Ref Range Status  03/05/2023 24 10 - 35 U/L Final         Passed - ALT in normal range and within 360 days    ALT  Date Value Ref Range Status  03/05/2023 24 9 - 46 U/L Final          HUMALOG 100 UNIT/ML injection 80 mL 0    Sig: INJECT 0-30 UNITS INTO THE SKIN 3X DAILY WITH MEALS. PER SLIDING SCALE. MAX DAILY DOSE 90U      Endocrinology:  Diabetes - Insulins Failed - 05/12/2023  8:58 PM      Failed - Valid encounter within last 6 months    Recent Outpatient Visits           2 years ago Diabetes mellitus, type II, insulin dependent (HCC)   [facility] Pickard, Priscille Heidelberg, MD   2 years ago Diabetes mellitus, type II, insulin dependent (HCC)   [facility] Pickard, Priscille Heidelberg, MD   3 years ago Diabetes mellitus, type II, insulin dependent (HCC)   [facility] Pickard, Priscille Heidelberg, MD   3 years ago Renal insufficiency   [facility] Donita Brooks, MD   3 years ago Diabetes mellitus, type II, insulin dependent (HCC)   [facility] Pickard, Priscille Heidelberg, MD       Future Appointments             In 2 weeks Jodelle Gross, NP [place] [facility] at [facility] - HBA1C is between 0 and 7.9 and within 180 days    Hgb A1c MFr Bld  Date Value Ref Range Status  03/05/2023 7.6 (H) <5.7 % of total Hgb Final    Comment:    For someone without known diabetes, a hemoglobin A1c value of 6.5% or greater indicates that they may have  diabetes and this should be confirmed with a follow-up  test. . For someone with known diabetes, a value <7% indicates  that their diabetes is well controlled and a value  greater than or equal to 7% indicates suboptimal  control. A1c targets should be individualized based on  duration of diabetes, age, comorbid conditions, and  other considerations. . Currently, no consensus exists regarding  use of hemoglobin A1c for diagnosis of diabetes for children. Marland Kitchen

## 2023-05-16 ENCOUNTER — Other Ambulatory Visit: Payer: Self-pay | Admitting: Family Medicine

## 2023-05-17 ENCOUNTER — Ambulatory Visit (INDEPENDENT_AMBULATORY_CARE_PROVIDER_SITE_OTHER): Payer: Medicare Other

## 2023-05-17 DIAGNOSIS — R55 Syncope and collapse: Secondary | ICD-10-CM

## 2023-05-17 LAB — CUP PACEART REMOTE DEVICE CHECK
Date Time Interrogation Session: 20240920230654
Implantable Pulse Generator Implant Date: 20221031

## 2023-05-18 NOTE — Telephone Encounter (Signed)
Requested medication (s) are due for refill today: yes  Requested medication (s) are on the active medication list: yes  Last refill:  08/21/22  Future visit scheduled: no  Notes to clinic:  Unable to refill per protocol, courtesy refill already given, routing for provider approval.      Requested Prescriptions  Pending Prescriptions Disp Refills   BD INSULIN SYRINGE U/F 31G X 5/16" 1 ML MISC [Pharmacy Med Name: BD INSULIN SYR UF 1 ML [id number]] 500 each 12    Sig: USE AS DIRECTED TO INJECT INSULIN 5 TIMES DAILY. DX:E11.65.     There is no refill protocol information for this order

## 2023-05-20 ENCOUNTER — Other Ambulatory Visit: Payer: Self-pay

## 2023-05-20 MED ORDER — "BD INSULIN SYRINGE U/F 31G X 5/16"" 1 ML MISC": 3 refills | Status: DC

## 2023-05-26 ENCOUNTER — Other Ambulatory Visit: Payer: Self-pay | Admitting: Family Medicine

## 2023-05-26 DIAGNOSIS — E785 Hyperlipidemia, unspecified: Secondary | ICD-10-CM

## 2023-05-26 NOTE — Telephone Encounter (Signed)
Requested Prescriptions  Pending Prescriptions Disp Refills   rosuvastatin (CRESTOR) 20 MG tablet [Pharmacy Med Name: ROSUVASTATIN CALCIUM 20 MG TAB] 90 tablet 0    Sig: TAKE 1 TABLET BY MOUTH EVERY DAY     Cardiovascular:  Antilipid - Statins 2 Failed - 05/26/2023  1:24 AM      Failed - Cr in normal range and within 360 days    Creat  Date Value Ref Range Status  03/05/2023 1.54 (H) 0.70 - 1.28 mg/dL Final   Creatinine, Urine  Date Value Ref Range Status  03/05/2023 53 20 - 320 mg/dL Final         Failed - Valid encounter within last 12 months    Recent Outpatient Visits           2 years ago Diabetes mellitus, type II, insulin dependent (HCC)   [facility] Donita Brooks, MD   3 years ago Diabetes mellitus, type II, insulin dependent (HCC)   [facility] Donita Brooks, MD   3 years ago Diabetes mellitus, type II, insulin dependent (HCC)   [facility] Pickard, Priscille Heidelberg, MD   3 years ago Renal insufficiency   [facility] Donita Brooks, MD   3 years ago Diabetes mellitus, type II, insulin dependent (HCC)   [facility] Pickard, Priscille Heidelberg, MD       Future Appointments             In 1 week Jodelle Gross, NP [place] [facility]            Failed - Lipid Panel in normal range within the last 12 months    Cholesterol  Date Value Ref Range Status  03/05/2023 107 <200 mg/dL Final   LDL Cholesterol (Calc)  Date Value Ref Range Status  03/05/2023 42 mg/dL (calc) Final    Comment:    Reference range: <100 . Desirable range <100 mg/dL for primary prevention;   <70 mg/dL for patients with CHD or diabetic patients  with > or = 2 CHD risk factors. Marland Kitchen LDL-C is now calculated using the Martin-Hopkins  calculation, which is a validated novel method providing  better accuracy than the Friedewald equation in the  estimation of LDL-C.  Horald Pollen et al. Lenox Ahr.  6295;284(13): 2061-2068  (http://education.[employer].com/faq/FAQ164)    HDL  Date Value Ref Range Status  03/05/2023 41 > OR = 40 mg/dL Final   Triglycerides  Date Value Ref Range Status  03/05/2023 158 (H) <150 mg/dL Final         Passed - Patient is not pregnant       metFORMIN (GLUCOPHAGE) 500 MG tablet [Pharmacy Med Name: METFORMIN HCL 500 MG TABLET] 360 tablet 0    Sig: TAKE 2 TABLETS BY MOUTH TWICE A DAY     Endocrinology:  Diabetes - Biguanides Failed - 05/26/2023  1:24 AM      Failed - Cr in normal range and within 360 days    Creat  Date Value Ref Range Status  03/05/2023 1.54 (H) 0.70 - 1.28 mg/dL Final   Creatinine, Urine  Date Value Ref Range Status  03/05/2023 53 20 - 320 mg/dL Final         Failed - eGFR in normal range and within 360 days    GFR, Est African American  Date Value Ref Range Status  11/25/2020 89 > OR = 60 mL/min/1.8m2 Final   GFR,  Est Non African American  Date Value Ref Range Status  11/25/2020 76 > OR = 60 mL/min/1.16m2 Final   GFR, Estimated  Date Value Ref Range Status  04/06/2021 >60 >60 mL/min Final    Comment:    (NOTE) Calculated using the CKD-EPI Creatinine Equation (2021)    eGFR  Date Value Ref Range Status  03/05/2023 48 (L) > OR = 60 mL/min/1.14m2 Final  01/19/2023 47 (L) >59 mL/min/1.73 Final         Failed - B12 Level in normal range and within 720 days    No results found for: "VITAMINB12"       Failed - Valid encounter within last 6 months    Recent Outpatient Visits           2 years ago Diabetes mellitus, type II, insulin dependent (HCC)   [facility] Pickard, Priscille Heidelberg, MD   3 years ago Diabetes mellitus, type II, insulin dependent (HCC)   [facility] Pickard, Priscille Heidelberg, MD   3 years ago Diabetes mellitus, type II, insulin dependent (HCC)   [facility] Pickard, Priscille Heidelberg, MD   3 years ago Renal insufficiency   [facility]  Donita Brooks, MD   3 years ago Diabetes mellitus, type II, insulin dependent (HCC)   [facility] Pickard, Priscille Heidelberg, MD       Future Appointments             In 1 week Jodelle Gross, NP [place] [facility] - HBA1C is between 0 and 7.9 and within 180 days    Hgb A1c MFr Bld  Date Value Ref Range Status  03/05/2023 7.6 (H) <5.7 % of total Hgb Final    Comment:    For someone without known diabetes, a hemoglobin A1c value of 6.5% or greater indicates that they may have  diabetes and this should be confirmed with a follow-up  test. . For someone with known diabetes, a value <7% indicates  that their diabetes is well controlled and a value  greater than or equal to 7% indicates suboptimal  control. A1c targets should be individualized based on  duration of diabetes, age, comorbid conditions, and  other considerations. . Currently, no consensus exists regarding use of hemoglobin A1c for diagnosis of diabetes for children. .          Passed - CBC within normal limits and completed in the last 12 months    WBC  Date Value Ref Range Status  03/05/2023 7.3 3.8 - 10.8 Thousand/uL Final   RBC  Date Value Ref Range Status  03/05/2023 4.77 4.20 - 5.80 Million/uL Final   Hemoglobin  Date Value Ref Range Status  03/05/2023 14.5 13.2 - 17.1 g/dL Final   HCT  Date Value Ref Range Status  03/05/2023 43.6 38.5 - 50.0 % Final   MCHC  Date Value Ref Range Status  03/05/2023 33.3 32.0 - 36.0 g/dL Final   [facility]  Date Value Ref Range Status  03/05/2023 30.4 27.0 - 33.0 pg Final   MCV  Date Value Ref Range Status  03/05/2023 91.4 80.0 - 100.0 fL Final   No results found for: "PLTCOUNTKUC", "LABPLAT", "POCPLA" RDW  Date Value Ref Range Status  03/05/2023 13.0 11.0 - 15.0 % Final

## 2023-05-28 NOTE — Progress Notes (Signed)
Carelink Summary Report / Loop Recorder 

## 2023-05-31 NOTE — Progress Notes (Unsigned)
Cardiology Clinic Note   Patient Name: Ronald Dorsey Date of Encounter: 05/31/2023  Primary Care Provider:  Donita Brooks, MD Primary Cardiologist:  Chrystie Nose, MD  Patient Profile    72 year old male with history of PAF, nonobstructive coronary artery disease on cardiac catheterization in January 2020, previous syncopal episode in 2022, ILR in situ), hypercholesterolemia, hypertension, type 2 diabetes.  Remains on Eliquis.  Last seen in the office on 10/23/2021.  They called our office on 02/12/2023 because he had an episode where he was incoherent lasting about 3 to 4 minutes.  Felt to be vaso-vagal syncope. ILR interrogation revealed no new clinically significant events, there were frequent PVCs representing roughly 67% of all recorded beats.  The patient's symptom triggered recording showed a period of ventricular bigeminy and frequent ventricular couplets. On last visit HCTZ was discontinued due to hypotension. He was to keep up with his BP and record this for review on follow up. He was losing weight as well.   Past Medical History    Past Medical History:  Diagnosis Date   Apnea    Arthritis    Diabetes mellitus without complication (HCC)    Hypercholesterolemia    Hypertension    Neuromuscular disorder (HCC)    sciatica   Paroxysmal atrial fibrillation (HCC)    PSVT (paroxysmal supraventricular tachycardia)    Past Surgical History:  Procedure Laterality Date   CARPAL TUNNEL RELEASE  2010   left wrist   EYE SURGERY     LEFT HEART CATH AND CORONARY ANGIOGRAPHY N/A 09/23/2018   Procedure: LEFT HEART CATH AND CORONARY ANGIOGRAPHY;  Surgeon: Kathleene Hazel, MD;  Location: [facility] INVASIVE CV LAB;  Service: Cardiovascular;  Laterality: N/A;   TONSILLECTOMY  1961    Allergies  Allergies  Allergen Reactions   Egg-Derived Products Nausea And Vomiting    History of Present Illness    [name] is comes today for follow concerning hypertension (hypotensive on  last office visit), vaso-vagal syncope, and PAF. Was to bring copy of BP recordings with him on this office visit after I discontinued HCTZ.   Home Medications    Current Outpatient Medications  Medication Sig Dispense Refill   Semaglutide, 2 MG/DOSE, 8 MG/3ML SOPN Inject 2 mg as directed once a week. 3 mL 3   amLODipine (NORVASC) 10 MG tablet TAKE 1 TABLET (10 MG TOTAL) BY MOUTH DAILY. PT NEED CPE APPT W/PCP FOR FUTURE REFILLS 90 tablet 1   BD INSULIN SYRINGE U/F 31G X 5/16" 1 ML MISC Use as directed to inject insulin 5 times per day. Dx E11.65 100 each 3   Continuous Blood Gluc Sensor (FREESTYLE LIBRE 14 DAY SENSOR) MISC Use as directed to monitor blood glucose continuously. Replace sensor Q14 days. Dx: e11.9 2 each 11   ELIQUIS 5 MG TABS tablet TAKE 1 TABLET BY MOUTH TWICE A DAY 60 tablet 5   empagliflozin (JARDIANCE) 25 MG TABS tablet TAKE 1 TABLET BY MOUTH EVERY DAY BEFORE BREAKFAST 90 tablet 1   ezetimibe (ZETIA) 10 MG tablet TAKE 1 TABLET BY MOUTH EVERY DAY 90 tablet 1   HUMALOG 100 UNIT/ML injection INJECT 0-30 UNITS INTO THE SKIN 3X DAILY WITH MEALS. PER SLIDING SCALE. MAX DAILY DOSE 90U 80 mL 0   Insulin Syringes, Disposable, (B-D INSULIN SYRINGE 1CC) U-100 1 ML MISC Use as directed to inject insulin 5x daily. Dx:E11.65. 500 each 12   LANTUS 100 UNIT/ML injection INJECT 50 UNITS UNDER THE SKIN TWICE A  DAY 90 mL 0   lisinopril (ZESTRIL) 20 MG tablet Take 20 mg by mouth daily. Take 1 Tablet Daily     metFORMIN (GLUCOPHAGE) 500 MG tablet TAKE 2 TABLETS BY MOUTH TWICE A DAY 360 tablet 0   metoprolol succinate (TOPROL-XL) 25 MG 24 hr tablet TAKE 1 TABLET BY MOUTH EVERY DAY 90 tablet 1   omega-3 acid ethyl esters (LOVAZA) 1 g capsule TAKE 2 CAPSULES BY MOUTH TWICE A DAY 360 capsule 1   pioglitazone (ACTOS) 30 MG tablet TAKE 1 TABLET BY MOUTH EVERY DAY BEFORE BREAKFAST 30 tablet 0   pregabalin (LYRICA) 150 MG capsule TAKE 1 CAPSULE BY MOUTH TWICE A DAY 180 capsule 3   rosuvastatin (CRESTOR)  20 MG tablet TAKE 1 TABLET BY MOUTH EVERY DAY 90 tablet 0   Current Facility-Administered Medications  Medication Dose Route Frequency Provider Last Rate Last Admin   lidocaine-EPINEPHrine (XYLOCAINE-EPINEPHrine) 1 %-1:200000 (PF) injection 10 mL  10 mL Infiltration Once Croitoru, Mihai, MD         Family History    Family History  Problem Relation Age of Onset   Hypertension Father    He indicated that the status of his father is unknown.  Social History    Social History   Socioeconomic History   Marital status: Married    Spouse name: Not on file   Number of children: Not on file   Years of education: Not on file   Highest education level: Not on file  Occupational History   Not on file  Tobacco Use   Smoking status: Former   Smokeless tobacco: Former  Substance and Sexual Activity   Alcohol use: Yes    Comment: 1 glass of wine a month or less   Drug use: No   Sexual activity: Yes    Comment: married, works in Nurse, learning disability business  Other Topics Concern   Not on file  Social History Narrative   Not on file   Social Determinants of Health   Financial Resource Strain: Low Risk  (08/27/2022)   Overall Financial Resource Strain (CARDIA)    Difficulty of Paying Living Expenses: Not hard at all  Food Insecurity: No Food Insecurity (08/27/2022)   Hunger Vital Sign    Worried About Running Out of Food in the Last Year: Never true    Ran Out of Food in the Last Year: Never true  Transportation Needs: No Transportation Needs (08/27/2022)   PRAPARE - Administrator, Civil Service (Medical): No    Lack of Transportation (Non-Medical): No  Physical Activity: Inactive (08/27/2022)   Exercise Vital Sign    Days of Exercise per Week: 0 days    Minutes of Exercise per Session: 0 min  Stress: No Stress Concern Present (08/27/2022)   [employer] of Occupational Health - Occupational Stress Questionnaire    Feeling of Stress : Not at all  Social Connections: Socially  Integrated (08/27/2022)   Social Connection and Isolation Panel [NHANES]    Frequency of Communication with Friends and Family: More than three times a week    Frequency of Social Gatherings with Friends and Family: Three times a week    Attends Religious Services: More than 4 times per year    Active Member of Clubs or Organizations: Yes    Attends Banker Meetings: More than 4 times per year    Marital Status: Married  Catering manager Violence: Not At Risk (08/27/2022)   Humiliation, Afraid, Rape, and Kick  questionnaire    Fear of Current or Ex-Partner: No    Emotionally Abused: No    Physically Abused: No    Sexually Abused: No     Review of Systems    General:  No chills, fever, night sweats or weight changes.  Cardiovascular:  No chest pain, dyspnea on exertion, edema, orthopnea, palpitations, paroxysmal nocturnal dyspnea. Dermatological: No rash, lesions/masses Respiratory: No cough, dyspnea Urologic: No hematuria, dysuria Abdominal:   No nausea, vomiting, diarrhea, bright red blood per rectum, melena, or hematemesis Neurologic:  No visual changes, wkns, changes in mental status. All other systems reviewed and are otherwise negative except as noted above.       Physical Exam    VS:  There were no vitals taken for this visit. , BMI There is no height or weight on file to calculate BMI.     GEN: Well nourished, well developed, in no acute distress. HEENT: normal. Neck: Supple, no JVD, carotid bruits, or masses. Cardiac: RRR, no murmurs, rubs, or gallops. No clubbing, cyanosis, edema.  Radials/DP/PT 2+ and equal bilaterally.  Respiratory:  Respirations regular and unlabored, clear to auscultation bilaterally. GI: Soft, nontender, nondistended, BS + x 4. MS: no deformity or atrophy. Skin: warm and dry, no rash. Neuro:  Strength and sensation are intact. Psych: Normal affect.      Lab Results  Component Value Date   WBC 7.3 03/05/2023   HGB 14.5 03/05/2023    HCT 43.6 03/05/2023   MCV 91.4 03/05/2023   PLT 195 03/05/2023   Lab Results  Component Value Date   CREATININE 1.54 (H) 03/05/2023   BUN 43 (H) 03/05/2023   NA 140 03/05/2023   K 4.5 03/05/2023   CL 104 03/05/2023   CO2 26 03/05/2023   Lab Results  Component Value Date   ALT 24 03/05/2023   AST 24 03/05/2023   ALKPHOS 43 09/24/2018   BILITOT 0.5 03/05/2023   Lab Results  Component Value Date   CHOL 107 03/05/2023   HDL 41 03/05/2023   LDLCALC 42 03/05/2023   TRIG 158 (H) 03/05/2023   CHOLHDL 2.6 03/05/2023    Lab Results  Component Value Date   HGBA1C 7.6 (H) 03/05/2023     Review of Prior Studies      Assessment & Plan   1.  ***     {Are you ordering a CV Procedure (e.g. stress test, cath, DCCV, TEE, etc)?   Press F2        :[id number]}   Signed, Bettey Mare. Liborio Nixon, ANP, AACC   05/31/2023 7:57 AM      Office [phone] Fax [phone]  Notice: This dictation was prepared with Dragon dictation along with smaller phrase technology. Any transcriptional errors that result from this process are unintentional and may not be corrected upon review.

## 2023-06-03 ENCOUNTER — Encounter: Payer: Self-pay | Admitting: Adult Health

## 2023-06-03 ENCOUNTER — Ambulatory Visit: Payer: Medicare Other | Attending: Adult Health | Admitting: Adult Health

## 2023-06-03 VITALS — BP 118/60 | HR 65 | Ht 73.0 in | Wt 248.0 lb

## 2023-06-03 DIAGNOSIS — R55 Syncope and collapse: Secondary | ICD-10-CM | POA: Insufficient documentation

## 2023-06-03 DIAGNOSIS — I2510 Atherosclerotic heart disease of native coronary artery without angina pectoris: Secondary | ICD-10-CM | POA: Diagnosis not present

## 2023-06-03 DIAGNOSIS — E7800 Pure hypercholesterolemia, unspecified: Secondary | ICD-10-CM | POA: Diagnosis not present

## 2023-06-03 DIAGNOSIS — E119 Type 2 diabetes mellitus without complications: Secondary | ICD-10-CM | POA: Insufficient documentation

## 2023-06-03 DIAGNOSIS — I480 Paroxysmal atrial fibrillation: Secondary | ICD-10-CM | POA: Diagnosis not present

## 2023-06-03 NOTE — Patient Instructions (Signed)
Medication Instructions:  Stop Eliquis. Start Aspirin 81 mg ( Take 1 Daily). *If you need a refill on your cardiac medications before your next appointment, please call your pharmacy*   Lab Work: No Labs If you have labs (blood work) drawn today and your tests are completely normal, you will receive your results only by: MyChart Message (if you have MyChart) OR A paper copy in the mail If you have any lab test that is abnormal or we need to change your treatment, we will call you to review the results.   Testing/Procedures: No Testing   Follow-Up: At [facility], you and your health needs are our priority.  As part of our continuing mission to provide you with exceptional heart care, we have created designated Provider Care Teams.  These Care Teams include your primary Cardiologist (physician) and Advanced Practice Providers (APPs -  Physician Assistants and Nurse Practitioners) who all work together to provide you with the care you need, when you need it.  We recommend signing up for the patient portal called "MyChart".  Sign up information is provided on this After Visit Summary.  MyChart is used to connect with patients for Virtual Visits (Telemedicine).  Patients are able to view lab/test results, encounter notes, upcoming appointments, etc.  Non-urgent messages can be sent to your provider as well.   To learn more about what you can do with MyChart, go to [email or web address].    Your next appointment:   5-6 month(s)  Provider:   Chrystie Nose, MD

## 2023-06-06 ENCOUNTER — Other Ambulatory Visit: Payer: Self-pay | Admitting: Family Medicine

## 2023-06-06 DIAGNOSIS — Z23 Encounter for immunization: Secondary | ICD-10-CM | POA: Diagnosis not present

## 2023-06-06 DIAGNOSIS — E119 Type 2 diabetes mellitus without complications: Secondary | ICD-10-CM

## 2023-06-07 NOTE — Telephone Encounter (Signed)
Requested medications are due for refill today.  yes  Requested medications are on the active medications list.  yes  Last refill. 04/27/2023 #30 0 rf  Future visit scheduled.   no  Notes to clinic.  Pt already given a courtesy refill.     Requested Prescriptions  Pending Prescriptions Disp Refills   pioglitazone (ACTOS) 30 MG tablet [Pharmacy Med Name: PIOGLITAZONE HCL 30 MG TABLET] 30 tablet 0    Sig: TAKE 1 TABLET BY MOUTH EVERY DAY BEFORE BREAKFAST     Endocrinology:  Diabetes - Glitazones - pioglitazone Failed - 06/06/2023 12:58 PM      Failed - Valid encounter within last 6 months    Recent Outpatient Visits           2 years ago Diabetes mellitus, type II, insulin dependent (HCC)   [facility] Pickard, Priscille Heidelberg, MD   3 years ago Diabetes mellitus, type II, insulin dependent (HCC)   [facility] Pickard, Priscille Heidelberg, MD   3 years ago Diabetes mellitus, type II, insulin dependent (HCC)   [facility] Pickard, Priscille Heidelberg, MD   3 years ago Renal insufficiency   [facility] Donita Brooks, MD   3 years ago Diabetes mellitus, type II, insulin dependent (HCC)   [facility] Pickard, Priscille Heidelberg, MD       Future Appointments             In 5 months Hilty, Lisette Abu, MD [facility]            Passed - HBA1C is between 0 and 7.9 and within 180 days    Hgb A1c MFr Bld  Date Value Ref Range Status  03/05/2023 7.6 (H) <5.7 % of total Hgb Final    Comment:    For someone without known diabetes, a hemoglobin A1c value of 6.5% or greater indicates that they may have  diabetes and this should be confirmed with a follow-up  test. . For someone with known diabetes, a value <7% indicates  that their diabetes is well controlled and a value  greater than or equal to 7% indicates suboptimal  control. A1c targets should be individualized based on  duration of diabetes,  age, comorbid conditions, and  other considerations. . Currently, no consensus exists regarding use of hemoglobin A1c for diagnosis of diabetes for children. Marland Kitchen

## 2023-06-09 IMAGING — DX DG CHEST 1V PORT
1 series · 1 of 1 positions shown · non-contrast
Comparison: 12/28/2019

CLINICAL DATA: Bradycardia

EXAM:
PORTABLE CHEST 1 VIEW

[chest ap]
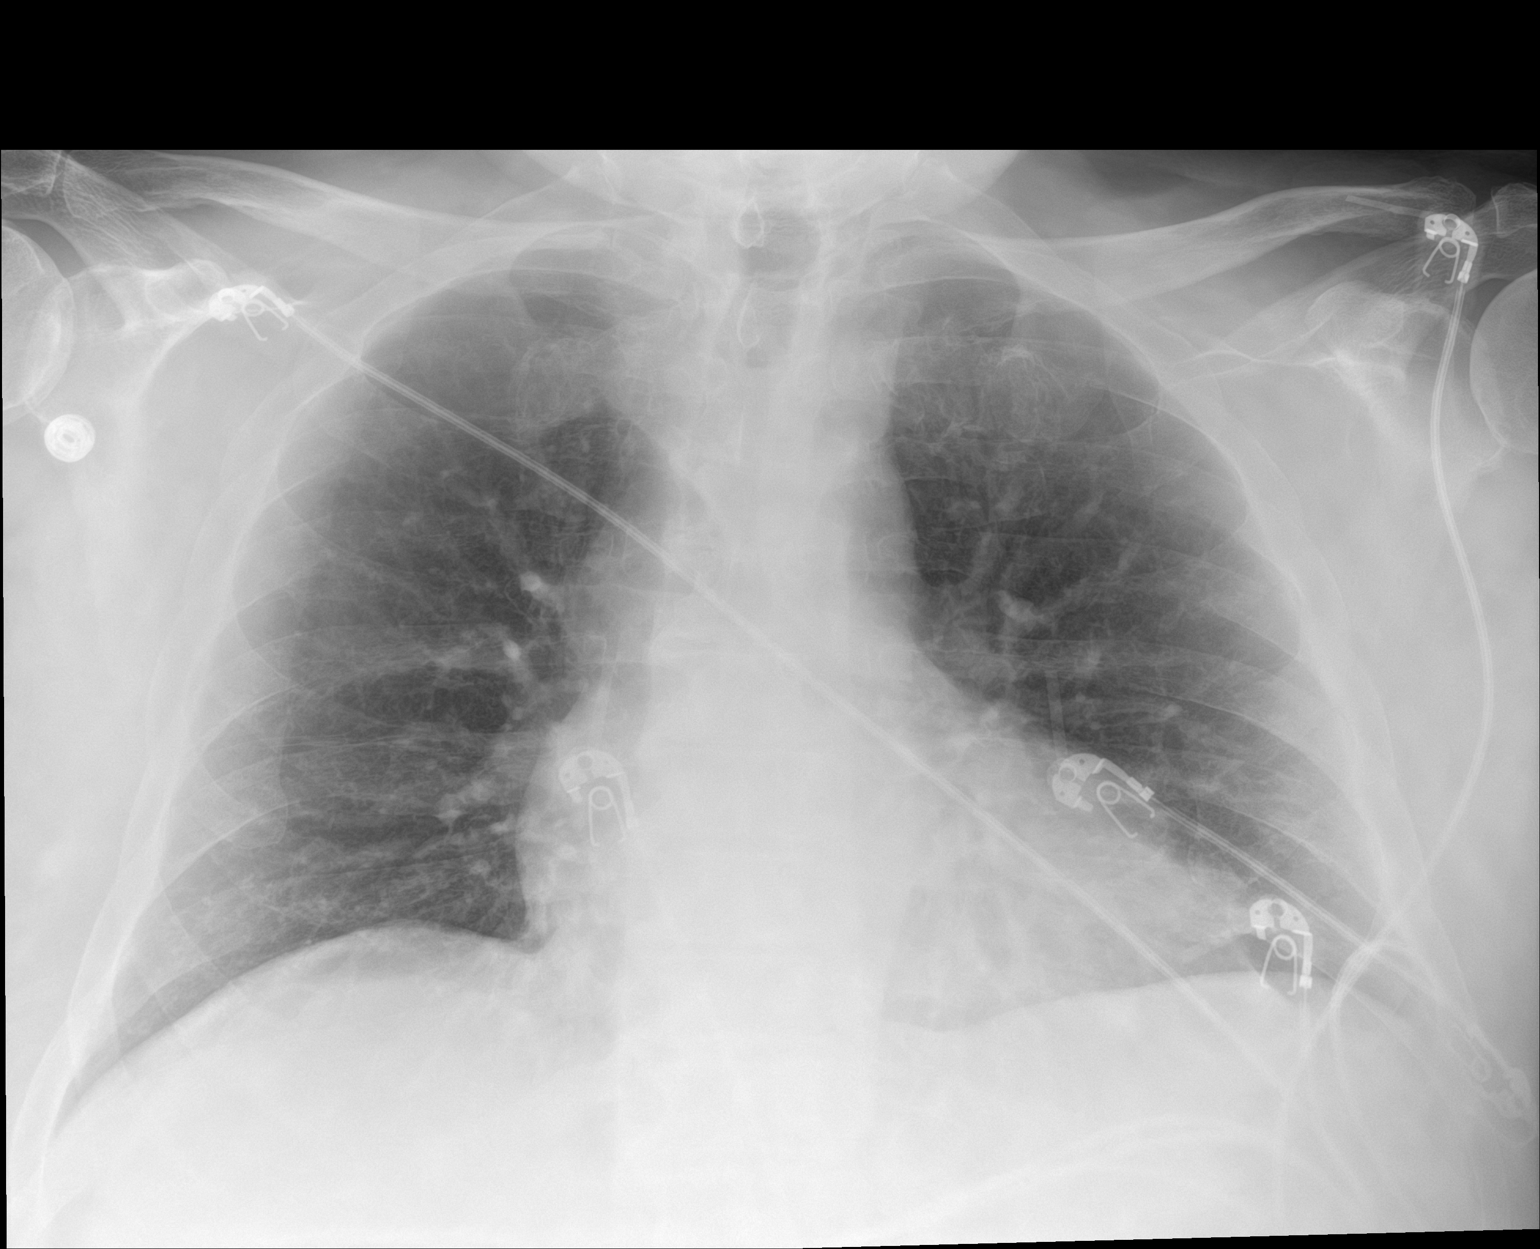

[1 of 1 positions shown; findings below may reference images not displayed]

FINDINGS: Cardiac shadow is prominent but accentuated by the portable
technique. No focal infiltrate or effusion is seen. No bony
abnormality is noted.
IMPRESSION: No active disease.

## 2023-06-21 ENCOUNTER — Ambulatory Visit (INDEPENDENT_AMBULATORY_CARE_PROVIDER_SITE_OTHER): Payer: Medicare Other

## 2023-06-21 DIAGNOSIS — R55 Syncope and collapse: Secondary | ICD-10-CM

## 2023-06-22 LAB — CUP PACEART REMOTE DEVICE CHECK
Date Time Interrogation Session: 20241027230800
Implantable Pulse Generator Implant Date: 20221031

## 2023-06-24 ENCOUNTER — Telehealth: Payer: Self-pay | Admitting: Family Medicine

## 2023-06-24 NOTE — Telephone Encounter (Signed)
Prescription Request  06/24/2023  LOV: 03/05/2023  What is the name of the medication or equipment? Ozempic 4mg /3 ml  Have you contacted your pharmacy to request a refill? Yes   Which pharmacy would you like this sent to?  [facility]  Patient notified that their request is being sent to the clinical staff for review and that they should receive a response within 2 business days.   Please advise at [facility] [phone]

## 2023-07-08 NOTE — Progress Notes (Signed)
Carelink Summary Report / Loop Recorder 

## 2023-07-19 ENCOUNTER — Encounter: Payer: Self-pay | Admitting: Family Medicine

## 2023-07-20 ENCOUNTER — Other Ambulatory Visit: Payer: Self-pay

## 2023-07-20 DIAGNOSIS — I10 Essential (primary) hypertension: Secondary | ICD-10-CM

## 2023-07-20 MED ORDER — AMLODIPINE BESYLATE 10 MG PO TABS
10.0000 mg | ORAL_TABLET | Freq: Every day | ORAL | 1 refills | Status: DC
Start: 2023-07-20 — End: 2023-11-10

## 2023-07-25 LAB — CUP PACEART REMOTE DEVICE CHECK
Date Time Interrogation Session: 20241129230237
Implantable Pulse Generator Implant Date: 20221031

## 2023-07-26 ENCOUNTER — Ambulatory Visit: Payer: Medicare Other

## 2023-07-26 DIAGNOSIS — R55 Syncope and collapse: Secondary | ICD-10-CM | POA: Diagnosis not present

## 2023-07-27 ENCOUNTER — Other Ambulatory Visit: Payer: Self-pay

## 2023-07-27 DIAGNOSIS — I10 Essential (primary) hypertension: Secondary | ICD-10-CM

## 2023-07-27 DIAGNOSIS — E119 Type 2 diabetes mellitus without complications: Secondary | ICD-10-CM

## 2023-07-27 MED ORDER — PIOGLITAZONE HCL 30 MG PO TABS: ORAL_TABLET | ORAL | 0 refills | Status: DC

## 2023-07-27 MED ORDER — LISINOPRIL 20 MG PO TABS: 20.0000 mg | ORAL_TABLET | Freq: Every day | ORAL | 0 refills | Status: DC

## 2023-08-19 ENCOUNTER — Other Ambulatory Visit: Payer: Self-pay | Admitting: Family Medicine

## 2023-08-19 DIAGNOSIS — I10 Essential (primary) hypertension: Secondary | ICD-10-CM

## 2023-08-19 DIAGNOSIS — E119 Type 2 diabetes mellitus without complications: Secondary | ICD-10-CM

## 2023-08-27 ENCOUNTER — Telehealth: Payer: Self-pay | Admitting: Family Medicine

## 2023-08-27 ENCOUNTER — Other Ambulatory Visit: Payer: Self-pay | Admitting: Family Medicine

## 2023-08-27 DIAGNOSIS — E7800 Pure hypercholesterolemia, unspecified: Secondary | ICD-10-CM

## 2023-08-27 DIAGNOSIS — E785 Hyperlipidemia, unspecified: Secondary | ICD-10-CM

## 2023-08-27 DIAGNOSIS — E119 Type 2 diabetes mellitus without complications: Secondary | ICD-10-CM

## 2023-08-27 NOTE — Telephone Encounter (Signed)
 Prescription Request  08/27/2023  LOV: 03/05/2023  What is the name of the medication or equipment? rosuvastatin  (CRESTOR ) 20 MG tablet   Have you contacted your pharmacy to request a refill? Yes   Which pharmacy would you like this sent to?  [id number] [place], [place] - [address] AT CORNER OF [facility] [address] [place] [address] Phone: [phone] Fax: [phone]    Patient notified that their request is being sent to the clinical staff for review and that they should receive a response within 2 business days.   Please advise at [facility] [phone]

## 2023-08-27 NOTE — Telephone Encounter (Signed)
 Prescription Request  08/27/2023  LOV: 03/05/2023  What is the name of the medication or equipment? metFORMIN  (GLUCOPHAGE ) 500 MG tablet   Have you contacted your pharmacy to request a refill? Yes   Which pharmacy would you like this sent to?  [id number] [place], [place] - [address] AT CORNER OF [facility] [address] [place] [address] Phone: [phone] Fax: [phone]    Patient notified that their request is being sent to the clinical staff for review and that they should receive a response within 2 business days.   Please advise at [facility] [phone]

## 2023-08-30 ENCOUNTER — Ambulatory Visit (INDEPENDENT_AMBULATORY_CARE_PROVIDER_SITE_OTHER): Payer: Medicare Other

## 2023-08-30 DIAGNOSIS — R55 Syncope and collapse: Secondary | ICD-10-CM | POA: Diagnosis not present

## 2023-08-30 LAB — CUP PACEART REMOTE DEVICE CHECK
Date Time Interrogation Session: 20250105230736
Implantable Pulse Generator Implant Date: 20221031

## 2023-08-31 MED ORDER — ROSUVASTATIN CALCIUM 20 MG PO TABS
20.0000 mg | ORAL_TABLET | Freq: Every day | ORAL | 1 refills | Status: DC
Start: 2023-08-31 — End: 2024-02-18

## 2023-08-31 MED ORDER — METFORMIN HCL 500 MG PO TABS: 1000.0000 mg | ORAL_TABLET | Freq: Two times a day (BID) | ORAL | 0 refills | Status: DC

## 2023-08-31 NOTE — Telephone Encounter (Signed)
 Requested Prescriptions  Pending Prescriptions Disp Refills   rosuvastatin  (CRESTOR ) 20 MG tablet 90 tablet 0    Sig: Take 1 tablet (20 mg total) by mouth daily.     Cardiovascular:  Antilipid - Statins 2 Failed - 08/31/2023 10:30 AM      Failed - Cr in normal range and within 360 days    Creat  Date Value Ref Range Status  03/05/2023 1.54 (H) 0.70 - 1.28 mg/dL Final   Creatinine, Urine  Date Value Ref Range Status  03/05/2023 53 20 - 320 mg/dL Final         Failed - Valid encounter within last 12 months    Recent Outpatient Visits           2 years ago Diabetes mellitus, type II, insulin  dependent (HCC)   [facility] Pickard, Butler DASEN, MD   3 years ago Diabetes mellitus, type II, insulin  dependent (HCC)   [facility] Pickard, Butler DASEN, MD   3 years ago Diabetes mellitus, type II, insulin  dependent (HCC)   [facility] Pickard, Butler DASEN, MD   3 years ago Renal insufficiency   [facility] Duanne Butler DASEN, MD   4 years ago Diabetes mellitus, type II, insulin  dependent (HCC)   [facility] Pickard, Butler DASEN, MD       Future Appointments             In 2 months Hilty, Vinie BROCKS, MD [facility] [address]            Failed - Lipid Panel in normal range within the last 12 months    Cholesterol  Date Value Ref Range Status  03/05/2023 107 <200 mg/dL Final   LDL Cholesterol (Calc)  Date Value Ref Range Status  03/05/2023 42 mg/dL (calc) Final    Comment:    Reference range: <100 . Desirable range <100 mg/dL for primary prevention;   <70 mg/dL for patients with CHD or diabetic patients  with > or = 2 CHD risk factors. SABRA LDL-C is now calculated using the Martin-Hopkins  calculation, which is a validated novel method providing  better accuracy than the Friedewald equation in the  estimation of LDL-C.  Gladis APPLETHWAITE et al. SANDREA. 7986;689(80): 2061-2068   (http://education.[employer].com/faq/FAQ164)    HDL  Date Value Ref Range Status  03/05/2023 41 > OR = 40 mg/dL Final   Triglycerides  Date Value Ref Range Status  03/05/2023 158 (H) <150 mg/dL Final         Passed - Patient is not pregnant       metFORMIN  (GLUCOPHAGE ) 500 MG tablet 360 tablet 0    Sig: Take 2 tablets (1,000 mg total) by mouth 2 (two) times daily.     Endocrinology:  Diabetes - Biguanides Failed - 08/31/2023 10:30 AM      Failed - Cr in normal range and within 360 days    Creat  Date Value Ref Range Status  03/05/2023 1.54 (H) 0.70 - 1.28 mg/dL Final   Creatinine, Urine  Date Value Ref Range Status  03/05/2023 53 20 - 320 mg/dL Final         Failed - eGFR in normal range and within 360 days    GFR, Est African American  Date Value Ref Range Status  11/25/2020 89 > OR = 60 mL/min/1.84m2 Final   GFR, Est Non African American  Date Value Ref Range Status  11/25/2020  76 > OR = 60 mL/min/1.71m2 Final   GFR, Estimated  Date Value Ref Range Status  04/06/2021 >60 >60 mL/min Final    Comment:    (NOTE) Calculated using the CKD-EPI Creatinine Equation (2021)    eGFR  Date Value Ref Range Status  03/05/2023 48 (L) > OR = 60 mL/min/1.81m2 Final  01/19/2023 47 (L) >59 mL/min/1.73 Final         Failed - B12 Level in normal range and within 720 days    No results found for: VITAMINB12       Failed - Valid encounter within last 6 months    Recent Outpatient Visits           2 years ago Diabetes mellitus, type II, insulin  dependent (HCC)   [facility] Pickard, Butler DASEN, MD   3 years ago Diabetes mellitus, type II, insulin  dependent (HCC)   [facility] Pickard, Butler DASEN, MD   3 years ago Diabetes mellitus, type II, insulin  dependent (HCC)   [facility] Pickard, Butler DASEN, MD   3 years ago Renal insufficiency   [facility] Duanne, Butler DASEN, MD   4 years ago Diabetes  mellitus, type II, insulin  dependent (HCC)   [facility] Pickard, Butler DASEN, MD       Future Appointments             In 2 months Hilty, Vinie BROCKS, MD [facility]            Passed - HBA1C is between 0 and 7.9 and within 180 days    Hgb A1c MFr Bld  Date Value Ref Range Status  03/05/2023 7.6 (H) <5.7 % of total Hgb Final    Comment:    For someone without known diabetes, a hemoglobin A1c value of 6.5% or greater indicates that they may have  diabetes and this should be confirmed with a follow-up  test. . For someone with known diabetes, a value <7% indicates  that their diabetes is well controlled and a value  greater than or equal to 7% indicates suboptimal  control. A1c targets should be individualized based on  duration of diabetes, age, comorbid conditions, and  other considerations. . Currently, no consensus exists regarding use of hemoglobin A1c for diagnosis of diabetes for children. .          Passed - CBC within normal limits and completed in the last 12 months    WBC  Date Value Ref Range Status  03/05/2023 7.3 3.8 - 10.8 Thousand/uL Final   RBC  Date Value Ref Range Status  03/05/2023 4.77 4.20 - 5.80 Million/uL Final   Hemoglobin  Date Value Ref Range Status  03/05/2023 14.5 13.2 - 17.1 g/dL Final   HCT  Date Value Ref Range Status  03/05/2023 43.6 38.5 - 50.0 % Final   MCHC  Date Value Ref Range Status  03/05/2023 33.3 32.0 - 36.0 g/dL Final   [facility]  Date Value Ref Range Status  03/05/2023 30.4 27.0 - 33.0 pg Final   MCV  Date Value Ref Range Status  03/05/2023 91.4 80.0 - 100.0 fL Final   No results found for: PLTCOUNTKUC, LABPLAT, POCPLA RDW  Date Value Ref Range Status  03/05/2023 13.0 11.0 - 15.0 % Final

## 2023-09-17 ENCOUNTER — Other Ambulatory Visit: Payer: Self-pay | Admitting: Family Medicine

## 2023-09-17 NOTE — Telephone Encounter (Signed)
Requested medication (s) are due for refill today: routing for review  Requested medication (s) are on the active medication list: yes  Last refill:    Future visit scheduled: no  Notes to clinic:  Unable to refill per protocol, no protocol attached.      Requested Prescriptions  Pending Prescriptions Disp Refills   BD INSULIN SYRINGE U/F 31G X 5/16" 1 ML MISC [Pharmacy Med Name: BD INSULIN SYR UF 1 ML [id number]] 100 each 3    Sig: Use as directed to inject insulin 5 times per day. Dx E11.65     There is no refill protocol information for this order

## 2023-10-04 ENCOUNTER — Ambulatory Visit (INDEPENDENT_AMBULATORY_CARE_PROVIDER_SITE_OTHER): Payer: Medicare Other

## 2023-10-04 DIAGNOSIS — R55 Syncope and collapse: Secondary | ICD-10-CM

## 2023-10-04 LAB — CUP PACEART REMOTE DEVICE CHECK
Date Time Interrogation Session: 20250209231039
Implantable Pulse Generator Implant Date: 20221031

## 2023-10-06 NOTE — Progress Notes (Signed)
Carelink Summary Report / Loop Recorder

## 2023-10-07 ENCOUNTER — Encounter: Payer: Self-pay | Admitting: Cardiovascular Disease

## 2023-11-08 ENCOUNTER — Ambulatory Visit: Payer: Medicare Other

## 2023-11-08 DIAGNOSIS — R55 Syncope and collapse: Secondary | ICD-10-CM | POA: Diagnosis not present

## 2023-11-08 NOTE — Progress Notes (Signed)
 Carelink Summary Report / Loop Recorder

## 2023-11-09 LAB — CUP PACEART REMOTE DEVICE CHECK
Date Time Interrogation Session: 20250316230825
Implantable Pulse Generator Implant Date: 20221031

## 2023-11-10 ENCOUNTER — Ambulatory Visit: Payer: Medicare Other | Attending: Internal Medicine | Admitting: Internal Medicine

## 2023-11-10 ENCOUNTER — Encounter: Payer: Self-pay | Admitting: Internal Medicine

## 2023-11-10 VITALS — BP 104/56 | HR 55 | Resp 16 | Ht 73.0 in | Wt 248.8 lb

## 2023-11-10 DIAGNOSIS — I480 Paroxysmal atrial fibrillation: Secondary | ICD-10-CM

## 2023-11-10 DIAGNOSIS — I10 Essential (primary) hypertension: Secondary | ICD-10-CM | POA: Diagnosis not present

## 2023-11-10 DIAGNOSIS — R5383 Other fatigue: Secondary | ICD-10-CM | POA: Diagnosis not present

## 2023-11-10 DIAGNOSIS — I2510 Atherosclerotic heart disease of native coronary artery without angina pectoris: Secondary | ICD-10-CM

## 2023-11-10 MED ORDER — AMLODIPINE BESYLATE 5 MG PO TABS: 5.0000 mg | ORAL_TABLET | Freq: Every day | ORAL | 3 refills | Status: DC

## 2023-11-10 NOTE — Progress Notes (Signed)
 OFFICE NOTE  Chief Complaint:  Follow-up  Primary Care Physician: Donita Brooks, MD  HPI:  Ronald Dorsey is a 73 y.o. male with a past medial history significant for paroxysmal atrial fibrillation which was seen during cardiac catheterization.  I first met him in the hospital which time he was having symptoms concerning for unstable angina.  He underwent left heart catheterization in January 2020 which showed minimal nonobstructive coronary disease however he was found to be in new onset atrial fibrillation.  Subsequently he was placed on Eliquis but had never come back to the office for follow-up.  Recently was seen in the emergency department after having had a syncopal episode.  Apparently EMS was dispatched to his house and he was somewhat confused on arrival.  His wife noted that he had had a syncopal episode with very little warning.  He was noted to be bradycardic and was given atropine with marked improvement in his symptoms.  Heart rate is in the 50s today however he said it was in the 40s apparently at that time.  There was some nausea and diaphoresis.  The episode overall sounds like it might be a vagal episode however given his history of arrhythmias, it sounds like he could also have possibly had a bradycardic event.  He also has insulin-dependent diabetes however he said blood sugar was assessed and was I believe 135 mg/dL by EMS.  03/25/9561  Ronald Dorsey is seen today in follow-up.  He is doing well without any further syncopal episodes.  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  He said he has been up and down the stairs numerous times and is asymptomatic with that.  He had an implanted loop recorder placed last November and has had remote checks since then.  He has had some PVCs but no recurrent A-fib, pauses or any arrhythmias.  11/10/2023  Ronald Dorsey returns for follow-up.  He is Ronald Dorsey October  2024.  She had reviewed a number of his implanted loop recorder studies dating back to 2023 which showed no recurrent A-fib.  He was interested in getting rate of some medications and noted that he was having easy bruising.  She then recommended stopping his Eliquis as she felt comfortable that there had been no recurrence and started him on low-dose aspirin.  He is still having issues with bruising.  He did have atrial fibrillation in 2020.  The etiology was unclear.  He has lost about 80 pounds since then and it may have been related to apnea or other issues.  My concern is that not being anticoagulated once his loop recorder battery dies we will not be able to follow whether he is having any recurrent A-fib or not.  He is also concerned about fatigue.  Today's blood pressure was low at 104/56.  Particular diastolic blood pressure is low.  He is on 3 blood pressure medications.  EKG shows regular PVCs in a trigeminal pattern which could also be contributing to that as he is bradycardic with heart rate at 55.  PMHx:  Past Medical History:  Diagnosis Date   Apnea    Arthritis    Diabetes mellitus without complication (HCC)    Hypercholesterolemia    Hypertension    Neuromuscular disorder (HCC)    sciatica   Paroxysmal atrial fibrillation (HCC)    PSVT (paroxysmal supraventricular tachycardia) (HCC)     Past  Surgical History:  Procedure Laterality Date   CARPAL TUNNEL RELEASE  2010   left wrist   EYE SURGERY     LEFT HEART CATH AND CORONARY ANGIOGRAPHY N/A 09/23/2018   Procedure: LEFT HEART CATH AND CORONARY ANGIOGRAPHY;  Surgeon: Kathleene Hazel, MD;  Location: [facility] IN[facility]AS[facility];  Service: Cardiovascular;  Laterality: N/A;   TONSILLECTOMY  1961    FAMHx:  Family History  Problem Relation Age of Onset   Hypertension Father     SOCHx:   reports that he has quit smoking. He has quit using smokeless tobacco. He reports current alcohol use. He reports that he does not use  drugs.  ALLERGIES:  Allergies  Allergen Reactions   Egg-Derived Products Nausea And Vomiting    ROS: Pertinent items noted in HPI and remainder of comprehensive ROS otherwise negative.  HOME MEDS: Current Outpatient Medications on File Prior to Visit  Medication Sig Dispense Refill   amLODipine (NORVASC) 10 MG tablet Take 1 tablet (10 mg total) by mouth daily. PT NEED CPE APPT W/PCP FOR FUTURE REFILLS 90 tablet 1   aspirin EC 81 MG tablet Take 81 mg by mouth daily. Swallow whole.     BD INSULIN SYRINGE U/F 31G X 5/16" 1 ML MISC USE AS DIRECTED TO INJECT INSULIN 5 TIMES PER DAY. DX E11.65 100 each 3   Continuous Blood Gluc Sensor (FREESTYLE LIBRE 14 DAY SENSOR) MISC Use as directed to monitor blood glucose continuously. Replace sensor Q14 days. Dx: e11.9 2 each 11   empagliflozin (JARDIANCE) 25 MG TABS tablet TAKE 1 TABLET BY MOUTH EVERY DAY BEFORE BREAKFAST 90 tablet 1   ezetimibe (ZETIA) 10 MG tablet TAKE 1 TABLET BY MOUTH EVERY DAY 90 tablet 1   HUMALOG 100 UNIT/ML injection INJECT 0-30 UNITS INTO THE SKIN 3X DAILY WITH MEALS. PER SLIDING SCALE. MAX DAILY DOSE 90U 80 mL 0   Insulin Syringes, Disposable, (B-D INSULIN SYRINGE 1CC) U-100 1 ML MISC Use as directed to inject insulin 5x daily. Dx:E11.65. 500 each 12   LANTUS 100 UNIT/ML injection INJECT 50 UNITS UNDER THE SKIN TWICE A DAY 90 mL 0   lisinopril (ZESTRIL) 20 MG tablet TAKE 1 TABLET (20 MG TOTAL) BY MOUTH DAILY. TAKE 1 TABLET DAILY 90 tablet 1   metFORMIN (GLUCOPHAGE) 500 MG tablet Take 2 tablets (1,000 mg total) by mouth 2 (two) times daily. 360 tablet 0   metoprolol succinate (TOPROL-XL) 25 MG 24 hr tablet TAKE 1 TABLET BY MOUTH EVERY DAY 90 tablet 1   omega-3 acid ethyl esters (LOVAZA) 1 g capsule TAKE 2 CAPSULES BY MOUTH TWICE A DAY 360 capsule 1   pioglitazone (ACTOS) 30 MG tablet TAKE 1 TABLET BY MOUTH EVERY DAY BEFORE BREAKFAST 90 tablet 1   pregabalin (LYRICA) 150 MG capsule TAKE 1 CAPSULE BY MOUTH TWICE A DAY 180 capsule  3   rosuvastatin (CRESTOR) 20 MG tablet Take 1 tablet (20 mg total) by mouth daily. 90 tablet 1   Semaglutide, 2 MG/DOSE, (OZEMPIC, 2 MG/DOSE,) 8 MG/3ML SOPN INJECT 2 MG AS DIRECTED ONCE A WEEK. 3 mL 3   Current Facility-Administered Medications on File Prior to Visit  Medication Dose Route Frequency Provider Last Rate Last Admin   lidocaine-EPINEPHrine (XYLOCAINE-EPINEPHrine) 1 %-1:200000 (PF) injection 10 mL  10 mL Infiltration Once Croitoru, Mihai, MD        LABS/IMAGING: No results found for this or any previous visit (from the past 48 hours). No results found.  LIPID PANEL:  Component Value Date/Time   CHOL 107 03/05/2023 0823   TRIG 158 (H) 03/05/2023 0823   HDL 41 03/05/2023 0823   CHOLHDL 2.6 03/05/2023 0823   VLDL 12 09/23/2018 0218   LDLCALC 42 03/05/2023 0823     WEIGHTS: Wt Readings from Last 3 Encounters:  11/10/23 248 lb 12.8 oz (112.9 kg)  06/03/23 248 lb (112.5 kg)  04/28/23 262 lb 6.4 oz (119 kg)    VITALS: BP (!) 104/56 (BP Location: Left Arm, Patient Position: Sitting, Cuff Size: Large)   Pulse (!) 55   Resp 16   Ht 6\' 1"  (1.854 m)   Wt 248 lb 12.8 oz (112.9 kg)   SpO2 96%   BMI 32.83 kg/m   EXAM: General appearance: alert and no distress Neck: no carotid bruit, no JVD, and thyroid not enlarged, symmetric, no tenderness/mass/nodules Lungs: clear to auscultation bilaterally Heart: regular rate and rhythm, S1, S2 normal, no murmur, click, rub or gallop Abdomen: soft, non-tender; bowel sounds normal; no masses,  no organomegaly Extremities: extremities normal, atraumatic, no cyanosis or edema Pulses: 2+ and symmetric Skin: Skin color, texture, turgor normal. No rashes or lesions Neurologic: Grossly normal Psych: Pleasant  EKG: EKG Interpretation Date/Time:  Wednesday November 10 2023 09:54:29 EDT Ventricular Rate:  55 PR Interval:  174 QRS Duration:  100 QT Interval:  436 QTC Calculation: 417 R Axis:   48  Text Interpretation: Sinus  bradycardia with frequent PVC's in a trigeminal pattern When compared with ECG of 06-Apr-2021 16:13, PREVIOUS ECG IS PRESENT Compared to previous tracing PVC's are more frequent Confirmed by Zoila Shutter [phone]) on 11/10/2023 10:04:35 AM    ASSESSMENT: Fatigue Syncope, status post ILR (06/2021) History of PAF-CHA2DS2-VASc score of 2 Anticoagulation on Eliquis Bradycardia  PLAN: 1.   Mr. Salley Slaughter has been complaining of some fatigue and has noted to be bradycardic and hypotensive today.  I advised decreasing his amlodipine from 10 to 5 mg daily.  He will need close follow-up in about 3 months.  We may also adjust his beta-blocker.  He was having frequent PVCs today and should have a repeat EKG when he comes back.  With regards to his bruising, he was switched from Eliquis to aspirin because of the lack of any demonstrated A-fib over the past 2 years on his monitor however the battery life is limited.  I would have a low threshold of restarting anticoagulation as he had A-fib that was spontaneous and he was unaware of it at the time.  Plan follow-up in 3 months.  Chrystie Nose, MD, [facility], FACP  [place]  [facility]  Medical Director of the Advanced Lipid Disorders &  Cardiovascular Risk Reduction Clinic Diplomate of the American Board of Clinical Lipidology Attending Cardiologist  Direct Dial: [phone]  Fax: [phone]  Website:  [email or web address] 11/10/2023, 10:04 AM

## 2023-11-10 NOTE — Patient Instructions (Signed)
 Medication Instructions:  DECREASE AMLODIPINE 5MG  DAILY *If you need a refill on your cardiac medications before your next appointment, please call your pharmacy*  Lab Work: NONE  Testing/Procedures: NONE  Follow-Up: At [facility], you and your health needs are our priority.  As part of our continuing mission to provide you with exceptional heart care, we have created designated Provider Care Teams.  These Care Teams include your primary Cardiologist (physician) and Advanced Practice Providers (APPs -  Physician Assistants and Nurse Practitioners) who all work together to provide you with the care you need, when you need it.  Your next appointment:   3 month(s)  Provider:   Joni Reining, DNP, ANP

## 2023-11-15 ENCOUNTER — Encounter: Payer: Self-pay | Admitting: Cardiovascular Disease

## 2023-11-18 ENCOUNTER — Other Ambulatory Visit: Payer: Self-pay | Admitting: Family Medicine

## 2023-11-18 DIAGNOSIS — I10 Essential (primary) hypertension: Secondary | ICD-10-CM

## 2023-11-23 ENCOUNTER — Other Ambulatory Visit: Payer: Self-pay

## 2023-11-23 ENCOUNTER — Encounter: Payer: Self-pay | Admitting: Internal Medicine

## 2023-11-23 DIAGNOSIS — I10 Essential (primary) hypertension: Secondary | ICD-10-CM

## 2023-11-23 NOTE — Telephone Encounter (Signed)
 Prescription Request  11/23/2023  LOV: 03/05/23  What is the name of the medication or equipment? empagliflozin (JARDIANCE) 25 MG TABS tablet [[id number]]   Have you contacted your pharmacy to request a refill? Yes   Which pharmacy would you like this sent to?  [id number] [place] [place] - [address] AT CORNER OF [facility] [address] [place] [address] Phone: [phone] Fax: [phone]    Patient notified that their request is being sent to the clinical staff for review and that they should receive a response within 2 business days.   Please advise at [facility] [phone]

## 2023-11-23 NOTE — Telephone Encounter (Signed)
 Prescription Request  11/23/2023  LOV: 03/05/23  What is the name of the medication or equipment? ezetimibe (ZETIA) 10 MG tablet [[id number]]  Have you contacted your pharmacy to request a refill? Yes   Which pharmacy would you like this sent to?  [id number] [place], [place] - [address] AT [address] OF [facility] [address] [place] [address] Phone: [phone] Fax: [phone]    Patient notified that their request is being sent to the clinical staff for review and that they should receive a response within 2 business days.   Please advise at [facility] [phone]

## 2023-11-23 NOTE — Telephone Encounter (Signed)
 Prescription Request  11/23/2023  LOV: 03/05/23  What is the name of the medication or equipment? metoprolol succinate (TOPROL-XL) 25 MG 24 hr tablet [[id number]]   Have you contacted your pharmacy to request a refill? Yes   Which pharmacy would you like this sent to?  [id number] [place], [place] - [address] AT CORNER OF [facility] [address] [place] [address] Phone: [phone] Fax: [phone]    Patient notified that their request is being sent to the clinical staff for review and that they should receive a response within 2 business days.   Please advise at [facility] [phone]

## 2023-11-24 MED ORDER — AMLODIPINE BESYLATE 5 MG PO TABS: 7.5000 mg | ORAL_TABLET | Freq: Every day | ORAL | 1 refills | Status: DC

## 2023-11-25 MED ORDER — METOPROLOL SUCCINATE ER 25 MG PO TB24: ORAL_TABLET | ORAL | 0 refills | Status: DC

## 2023-11-25 MED ORDER — EMPAGLIFLOZIN 25 MG PO TABS: 25.0000 mg | ORAL_TABLET | Freq: Every day | ORAL | 0 refills | Status: DC

## 2023-11-25 NOTE — Telephone Encounter (Signed)
 Courtesy refill. Patient will need an office visit for additional refills. Requested Prescriptions  Pending Prescriptions Disp Refills   metoprolol succinate (TOPROL-XL) 25 MG 24 hr tablet 90 tablet 1    Sig: TAKE 1 TABLET BY MOUTH EVERY DAY     Cardiovascular:  Beta Blockers Failed - 11/25/2023 11:24 AM      Failed - Valid encounter within last 6 months    Recent Outpatient Visits           8 months ago Diabetes mellitus, type II, insulin dependent (HCC)   [facility] Medicine Pickard, Priscille Heidelberg, MD   1 year ago Diabetes mellitus, type II, insulin dependent [facility])   [place] [facility] Pickard, Priscille Heidelberg, MD       Future Appointments             In 2 months Denyce Robert, NP [place] [facility] at [facility] - Last BP in normal range    BP Readings from Last 1 Encounters:  11/10/23 (!) 104/56         Passed - Last Heart Rate in normal range    Pulse Readings from Last 1 Encounters:  11/10/23 (!) 55          empagliflozin (JARDIANCE) 25 MG TABS tablet 90 tablet 1     Endocrinology:  Diabetes - SGLT2 Inhibitors Failed - 11/25/2023 11:24 AM      Failed - Cr in normal range and within 360 days    Creat  Date Value Ref Range Status  03/05/2023 1.54 (H) 0.70 - 1.28 mg/dL Final   Creatinine, Urine  Date Value Ref Range Status  03/05/2023 53 20 - 320 mg/dL Final         Failed - HBA1C is between 0 and 7.9 and within 180 days    Hgb A1c MFr Bld  Date Value Ref Range Status  03/05/2023 7.6 (H) <5.7 % of total Hgb Final    Comment:    For someone without known diabetes, a hemoglobin A1c value of 6.5% or greater indicates that they may have  diabetes and this should be confirmed with a follow-up  test. . For someone with known diabetes, a value <7% indicates  that their diabetes is well controlled and a value  greater than or equal to 7% indicates suboptimal  control. A1c targets should be  individualized based on  duration of diabetes, age, comorbid conditions, and  other considerations. . Currently, no consensus exists regarding use of hemoglobin A1c for diagnosis of diabetes for children. .          Failed - eGFR in normal range and within 360 days    GFR, Est African American  Date Value Ref Range Status  11/25/2020 89 > OR = 60 mL/min/1.41m2 Final   GFR, Est Non African American  Date Value Ref Range Status  11/25/2020 76 > OR = 60 mL/min/1.49m2 Final   GFR, Estimated  Date Value Ref Range Status  04/06/2021 >60 >60 mL/min Final    Comment:    (NOTE) Calculated using the CKD-EPI Creatinine Equation (2021)    eGFR  Date Value Ref Range Status  03/05/2023 48 (L) > OR = 60 mL/min/1.75m2 Final  01/19/2023 47 (L) >59 mL/min/1.73 Final         Failed - Valid encounter within last 6 months    Recent Outpatient Visits  8 months ago Diabetes mellitus, type II, insulin dependent [facility])   [facility] Medicine Pickard, Priscille Heidelberg, MD   1 year ago Diabetes mellitus, type II, insulin dependent [facility])   [place] [facility] Medicine Pickard, Priscille Heidelberg, MD       Future Appointments             In 2 months Denyce Robert, NP [facility] at [facility]

## 2023-11-25 NOTE — Telephone Encounter (Signed)
 Called pt to schedule an appt - left message on machine to return our call.

## 2023-11-27 ENCOUNTER — Other Ambulatory Visit: Payer: Self-pay | Admitting: Family Medicine

## 2023-11-29 NOTE — Telephone Encounter (Signed)
 OV needed for additional refills.  Requested Prescriptions  Pending Prescriptions Disp Refills   metFORMIN (GLUCOPHAGE) 500 MG tablet [Pharmacy Med Name: METFORMIN HCL 500 MG TABLET] 120 tablet 0    Sig: TAKE 2 TABLETS BY MOUTH TWICE A DAY     Endocrinology:  Diabetes - Biguanides Failed - 11/29/2023 11:27 AM      Failed - Cr in normal range and within 360 days    Creat  Date Value Ref Range Status  03/05/2023 1.54 (H) 0.70 - 1.28 mg/dL Final   Creatinine, Urine  Date Value Ref Range Status  03/05/2023 53 20 - 320 mg/dL Final         Failed - HBA1C is between 0 and 7.9 and within 180 days    Hgb A1c MFr Bld  Date Value Ref Range Status  03/05/2023 7.6 (H) <5.7 % of total Hgb Final    Comment:    For someone without known diabetes, a hemoglobin A1c value of 6.5% or greater indicates that they may have  diabetes and this should be confirmed with a follow-up  test. . For someone with known diabetes, a value <7% indicates  that their diabetes is well controlled and a value  greater than or equal to 7% indicates suboptimal  control. A1c targets should be individualized based on  duration of diabetes, age, comorbid conditions, and  other considerations. . Currently, no consensus exists regarding use of hemoglobin A1c for diagnosis of diabetes for children. .          Failed - eGFR in normal range and within 360 days    GFR, Est African American  Date Value Ref Range Status  11/25/2020 89 > OR = 60 mL/min/1.49m2 Final   GFR, Est Non African American  Date Value Ref Range Status  11/25/2020 76 > OR = 60 mL/min/1.90m2 Final   GFR, Estimated  Date Value Ref Range Status  04/06/2021 >60 >60 mL/min Final    Comment:    (NOTE) Calculated using the CKD-EPI Creatinine Equation (2021)    eGFR  Date Value Ref Range Status  03/05/2023 48 (L) > OR = 60 mL/min/1.29m2 Final  01/19/2023 47 (L) >59 mL/min/1.73 Final         Failed - B12 Level in normal range and within 720  days    No results found for: "VITAMINB12"       Failed - Valid encounter within last 6 months    Recent Outpatient Visits           8 months ago Diabetes mellitus, type II, insulin dependent (HCC)   [place] [facility] Medicine Pickard, Priscille Heidelberg, MD   1 year ago Diabetes mellitus, type II, insulin dependent [facility])   [place] [facility] Medicine Pickard, Priscille Heidelberg, MD       Future Appointments             In 2 months Denyce Robert, NP [place] [facility] - CBC within normal limits and completed in the last 12 months    WBC  Date Value Ref Range Status  03/05/2023 7.3 3.8 - 10.8 Thousand/uL Final   RBC  Date Value Ref Range Status  03/05/2023 4.77 4.20 - 5.80 Million/uL Final   Hemoglobin  Date Value Ref Range Status  03/05/2023 14.5 13.2 - 17.1 g/dL Final   HCT  Date Value Ref Range Status  03/05/2023  43.6 38.5 - 50.0 % Final   MCHC  Date Value Ref Range Status  03/05/2023 33.3 32.0 - 36.0 g/dL Final   [facility]  Date Value Ref Range Status  03/05/2023 30.4 27.0 - 33.0 pg Final   MCV  Date Value Ref Range Status  03/05/2023 91.4 80.0 - 100.0 fL Final   No results found for: "PLTCOUNTKUC", "LABPLAT", "POCPLA" RDW  Date Value Ref Range Status  03/05/2023 13.0 11.0 - 15.0 % Final

## 2023-12-13 ENCOUNTER — Ambulatory Visit: Payer: Medicare Other

## 2023-12-13 DIAGNOSIS — R55 Syncope and collapse: Secondary | ICD-10-CM | POA: Diagnosis not present

## 2023-12-14 LAB — CUP PACEART REMOTE DEVICE CHECK
Date Time Interrogation Session: 20250420230820
Implantable Pulse Generator Implant Date: 20221031

## 2023-12-18 ENCOUNTER — Other Ambulatory Visit: Payer: Self-pay | Admitting: Family Medicine

## 2023-12-18 DIAGNOSIS — E7800 Pure hypercholesterolemia, unspecified: Secondary | ICD-10-CM

## 2023-12-18 DIAGNOSIS — E119 Type 2 diabetes mellitus without complications: Secondary | ICD-10-CM

## 2023-12-20 NOTE — Telephone Encounter (Signed)
 OV 03/04/24- overdue 6 month f/u-- courtesy refill- will need appointment for next refill Requested Prescriptions  Pending Prescriptions Disp Refills   Semaglutide , 2 MG/DOSE, (OZEMPIC , 2 MG/DOSE,) 8 MG/3ML SOPN [Pharmacy Med Name: OZEMPIC  8 MG/3 ML (2 MG/DOSE)] 3 mL 0    Sig: INJECT 2MG  DOSE UNDER THE SKIN ONE TIME PER WEEK AS DIRECTED     Endocrinology:  Diabetes - GLP-1 Receptor Agonists - semaglutide  Failed - 12/20/2023 10:33 AM      Failed - HBA1C in normal range and within 180 days    Hgb A1c MFr Bld  Date Value Ref Range Status  03/05/2023 7.6 (H) <5.7 % of total Hgb Final    Comment:    For someone without known diabetes, a hemoglobin A1c value of 6.5% or greater indicates that they may have  diabetes and this should be confirmed with a follow-up  test. . For someone with known diabetes, a value <7% indicates  that their diabetes is well controlled and a value  greater than or equal to 7% indicates suboptimal  control. A1c targets should be individualized based on  duration of diabetes, age, comorbid conditions, and  other considerations. . Currently, no consensus exists regarding use of hemoglobin A1c for diagnosis of diabetes for children. .          Failed - Cr in normal range and within 360 days    Creat  Date Value Ref Range Status  03/05/2023 1.54 (H) 0.70 - 1.28 mg/dL Final   Creatinine, Urine  Date Value Ref Range Status  03/05/2023 53 20 - 320 mg/dL Final         Failed - Valid encounter within last 6 months    Recent Outpatient Visits           9 months ago Diabetes mellitus, type II, insulin  dependent [facility])   [facility] Medicine Pickard, Cisco Crest, MD   1 year ago Diabetes mellitus, type II, insulin  dependent [facility])   [place] [facility] Pickard, Cisco Crest, MD       Future Appointments             In 1 month [place], Shauna Del, NP [facility]

## 2023-12-22 NOTE — Progress Notes (Signed)
 Carelink Summary Report / Loop Recorder

## 2023-12-22 NOTE — Addendum Note (Signed)
 Addended by: Edra Govern D on: 12/22/2023 03:42 PM   Modules accepted: Orders

## 2023-12-24 ENCOUNTER — Other Ambulatory Visit: Payer: Self-pay | Admitting: Family Medicine

## 2023-12-24 ENCOUNTER — Other Ambulatory Visit: Payer: Self-pay

## 2023-12-24 DIAGNOSIS — E7800 Pure hypercholesterolemia, unspecified: Secondary | ICD-10-CM

## 2023-12-24 DIAGNOSIS — E119 Type 2 diabetes mellitus without complications: Secondary | ICD-10-CM

## 2023-12-24 MED ORDER — SEMAGLUTIDE (1 MG/DOSE) 4 MG/3ML ~~LOC~~ SOPN: 1.0000 mg | PEN_INJECTOR | SUBCUTANEOUS | 1 refills | Status: DC

## 2023-12-25 ENCOUNTER — Encounter: Payer: Self-pay | Admitting: Cardiovascular Disease

## 2023-12-28 ENCOUNTER — Other Ambulatory Visit: Payer: Self-pay | Admitting: Family Medicine

## 2023-12-28 NOTE — Telephone Encounter (Signed)
 Requested medication (s) are due for refill today: yes  Requested medication (s) are on the active medication list: yes  Last refill:  11/25/23  Future visit scheduled: no  Notes to clinic:  Unable to refill per protocol, courtesy refill already given, routing for provider approval.       Requested Prescriptions  Pending Prescriptions Disp Refills   metFORMIN  (GLUCOPHAGE ) 500 MG tablet [Pharmacy Med Name: METFORMIN  HCL 500 MG TABLET] 360 tablet 1    Sig: TAKE 2 TABLETS BY MOUTH TWICE A DAY     Endocrinology:  Diabetes - Biguanides Failed - 12/28/2023  8:18 AM      Failed - Cr in normal range and within 360 days    Creat  Date Value Ref Range Status  03/05/2023 1.54 (H) 0.70 - 1.28 mg/dL Final   Creatinine, Urine  Date Value Ref Range Status  03/05/2023 53 20 - 320 mg/dL Final         Failed - HBA1C is between 0 and 7.9 and within 180 days    Hgb A1c MFr Bld  Date Value Ref Range Status  03/05/2023 7.6 (H) <5.7 % of total Hgb Final    Comment:    For someone without known diabetes, a hemoglobin A1c value of 6.5% or greater indicates that they may have  diabetes and this should be confirmed with a follow-up  test. . For someone with known diabetes, a value <7% indicates  that their diabetes is well controlled and a value  greater than or equal to 7% indicates suboptimal  control. A1c targets should be individualized based on  duration of diabetes, age, comorbid conditions, and  other considerations. . Currently, no consensus exists regarding use of hemoglobin A1c for diagnosis of diabetes for children. .          Failed - eGFR in normal range and within 360 days    GFR, Est African American  Date Value Ref Range Status  11/25/2020 89 > OR = 60 mL/min/1.56m2 Final   GFR, Est Non African American  Date Value Ref Range Status  11/25/2020 76 > OR = 60 mL/min/1.24m2 Final   GFR, Estimated  Date Value Ref Range Status  04/06/2021 >60 >60 mL/min Final    Comment:     (NOTE) Calculated using the CKD-EPI Creatinine Equation (2021)    eGFR  Date Value Ref Range Status  03/05/2023 48 (L) > OR = 60 mL/min/1.61m2 Final  01/19/2023 47 (L) >59 mL/min/1.73 Final         Failed - B12 Level in normal range and within 720 days    No results found for: "VITAMINB12"       Failed - Valid encounter within last 6 months    Recent Outpatient Visits           9 months ago Diabetes mellitus, type II, insulin  dependent [facility])   [place] [facility] Pickard, Cisco Crest, MD   1 year ago Diabetes mellitus, type II, insulin  dependent [facility])   [place] [facility] Medicine Pickard, Cisco Crest, MD       Future Appointments             In 1 month [place], Shauna Del, NP [facility] at [facility] Dept of [employer]. [facility], [facility]            Passed - CBC within normal limits and completed in the last 12 months    WBC  Date Value Ref Range Status  03/05/2023 7.3 3.8 - 10.8 Thousand/uL Final   RBC  Date Value Ref Range Status  03/05/2023 4.77 4.20 - 5.80 Million/uL Final   Hemoglobin  Date Value Ref Range Status  03/05/2023 14.5 13.2 - 17.1 g/dL Final   HCT  Date Value Ref Range Status  03/05/2023 43.6 38.5 - 50.0 % Final   MCHC  Date Value Ref Range Status  03/05/2023 33.3 32.0 - 36.0 g/dL Final   [facility]  Date Value Ref Range Status  03/05/2023 30.4 27.0 - 33.0 pg Final   MCV  Date Value Ref Range Status  03/05/2023 91.4 80.0 - 100.0 fL Final   No results found for: "PLTCOUNTKUC", "LABPLAT", "POCPLA" RDW  Date Value Ref Range Status  03/05/2023 13.0 11.0 - 15.0 % Final          metoprolol  succinate (TOPROL -XL) 25 MG 24 hr tablet [Pharmacy Med Name: METOPROLOL  SUCC ER 25 MG TAB] 90 tablet 1    Sig: TAKE 1 TABLET BY MOUTH EVERY DAY     Cardiovascular:  Beta Blockers Failed - 12/28/2023  8:18 AM      Failed - Valid encounter within last 6 months    Recent Outpatient Visits           9 months ago Diabetes  mellitus, type II, insulin  dependent [facility])   [facility] Medicine Austine Lefort, MD   1 year ago Diabetes mellitus, type II, insulin  dependent [facility])   [place] [facility] Medicine Pickard, Cisco Crest, MD       Future Appointments             In 1 month Ava Boatman, NP [facility] at [facility] Dept of [employer]. [facility], [facility]            Passed - Last BP in normal range    BP Readings from Last 1 Encounters:  11/10/23 (!) 104/56         Passed - Last Heart Rate in normal range    Pulse Readings from Last 1 Encounters:  11/10/23 (!) 55

## 2023-12-29 NOTE — Telephone Encounter (Signed)
 Requested medication (s) are due for refill today: yes  Requested medication (s) are on the active medication list: yes  Last refill:  11/25/23   #30 (courtesy refill)  Future visit scheduled: no  Notes to clinic:  Sent pt MyChart message to call and make appointment   Requested Prescriptions  Pending Prescriptions Disp Refills   JARDIANCE  25 MG TABS tablet [Pharmacy Med Name: JARDIANCE  25 MG TABLET] 30 tablet 0    Sig: TAKE 1 TABLET (25 MG TOTAL) BY MOUTH DAILY.     Endocrinology:  Diabetes - SGLT2 Inhibitors Failed - 12/29/2023  2:01 PM      Failed - Cr in normal range and within 360 days    Creat  Date Value Ref Range Status  03/05/2023 1.54 (H) 0.70 - 1.28 mg/dL Final   Creatinine, Urine  Date Value Ref Range Status  03/05/2023 53 20 - 320 mg/dL Final         Failed - HBA1C is between 0 and 7.9 and within 180 days    Hgb A1c MFr Bld  Date Value Ref Range Status  03/05/2023 7.6 (H) <5.7 % of total Hgb Final    Comment:    For someone without known diabetes, a hemoglobin A1c value of 6.5% or greater indicates that they may have  diabetes and this should be confirmed with a follow-up  test. . For someone with known diabetes, a value <7% indicates  that their diabetes is well controlled and a value  greater than or equal to 7% indicates suboptimal  control. A1c targets should be individualized based on  duration of diabetes, age, comorbid conditions, and  other considerations. . Currently, no consensus exists regarding use of hemoglobin A1c for diagnosis of diabetes for children. .          Failed - eGFR in normal range and within 360 days    GFR, Est African American  Date Value Ref Range Status  11/25/2020 89 > OR = 60 mL/min/1.30m2 Final   GFR, Est Non African American  Date Value Ref Range Status  11/25/2020 76 > OR = 60 mL/min/1.54m2 Final   GFR, Estimated  Date Value Ref Range Status  04/06/2021 >60 >60 mL/min Final    Comment:    (NOTE) Calculated  using the CKD-EPI Creatinine Equation (2021)    eGFR  Date Value Ref Range Status  03/05/2023 48 (L) > OR = 60 mL/min/1.83m2 Final  01/19/2023 47 (L) >59 mL/min/1.73 Final         Failed - Valid encounter within last 6 months    Recent Outpatient Visits           9 months ago Diabetes mellitus, type II, insulin  dependent [facility]   [place] [facility] Pickard, Cisco Crest, MD   1 year ago Diabetes mellitus, type II, insulin  dependent [facility])    [facility] Medicine Pickard, Cisco Crest, MD       Future Appointments             In 1 month [place], Shauna Del, NP [facility] at [facility] A Dept of [employer]. [facility], H[facility]

## 2024-01-18 ENCOUNTER — Ambulatory Visit (INDEPENDENT_AMBULATORY_CARE_PROVIDER_SITE_OTHER): Payer: Medicare Other

## 2024-01-18 DIAGNOSIS — R55 Syncope and collapse: Secondary | ICD-10-CM | POA: Diagnosis not present

## 2024-01-18 LAB — CUP PACEART REMOTE DEVICE CHECK
Date Time Interrogation Session: 20250526231322
Implantable Pulse Generator Implant Date: 20221031

## 2024-01-19 ENCOUNTER — Ambulatory Visit: Payer: Self-pay | Admitting: Cardiovascular Disease

## 2024-01-28 NOTE — Progress Notes (Signed)
 Carelink Summary Report / Loop Recorder

## 2024-01-28 NOTE — Addendum Note (Signed)
 Addended by: Edra Govern D on: 01/28/2024 02:50 PM   Modules accepted: Orders

## 2024-02-05 ENCOUNTER — Other Ambulatory Visit: Payer: Self-pay | Admitting: Internal Medicine

## 2024-02-05 DIAGNOSIS — I10 Essential (primary) hypertension: Secondary | ICD-10-CM

## 2024-02-11 ENCOUNTER — Ambulatory Visit: Attending: Emergency Medicine | Admitting: Emergency Medicine

## 2024-02-11 ENCOUNTER — Encounter: Payer: Self-pay | Admitting: Emergency Medicine

## 2024-02-11 VITALS — BP 112/50 | HR 64 | Ht 73.0 in | Wt 244.0 lb

## 2024-02-11 DIAGNOSIS — I493 Ventricular premature depolarization: Secondary | ICD-10-CM | POA: Insufficient documentation

## 2024-02-11 DIAGNOSIS — I10 Essential (primary) hypertension: Secondary | ICD-10-CM | POA: Insufficient documentation

## 2024-02-11 DIAGNOSIS — I2510 Atherosclerotic heart disease of native coronary artery without angina pectoris: Secondary | ICD-10-CM

## 2024-02-11 DIAGNOSIS — Z79899 Other long term (current) drug therapy: Secondary | ICD-10-CM | POA: Insufficient documentation

## 2024-02-11 DIAGNOSIS — I480 Paroxysmal atrial fibrillation: Secondary | ICD-10-CM | POA: Diagnosis not present

## 2024-02-11 DIAGNOSIS — E7800 Pure hypercholesterolemia, unspecified: Secondary | ICD-10-CM

## 2024-02-11 NOTE — Progress Notes (Addendum)
 Cardiology Office Note:    Date:  02/11/2024  ID:  Ronald Dorsey, DOB Dec 23, 1950, MRN [id number] PCP: Duanne Butler DASEN, MD  [facility] Providers Cardiologist:  Vinie JAYSON Maxcy, MD       Patient Profile:       Chief Complaint: 3-month follow-up History of Present Illness:  Ronald Dorsey is a 73 y.o. male with visit-pertinent history of paroxysmal atrial fibrillation, nonobstructive coronary artery disease on cardiac catheterization 08/2018, previous syncopal episode in 2022 s/p ILR in situ, hypercholesteremia, hypertension, type 2 diabetes  He underwent left heart catheterization in January 2020 which showed minimal nonobstructive coronary disease however was found to be in new onset atrial fibrillation.  Subsequently he was started on Eliquis .  He was seen by Lamarr, NP in October 2020 for.  He was having easy bruising and Eliquis  was discontinued and he was started on low-dose aspirin .  He was last seen in office on 11/10/2023.  He was complaining of some fatigue and found to be bradycardic and hypotensive.  His amlodipine  was decreased from 10 to 5 mg daily.  He was also noted to be having frequent PVCs on EKG.  He was to follow-up in 3 months.  He called back into the office on 11/23/2023 noting elevated blood pressures.  His amlodipine  was increased to 7.5 mg daily   Discussed the use of AI scribe software for clinical note transcription with the patient, who gave verbal consent to proceed.  History of Present Illness Ronald Dorsey is a 73 year old male with hypertension who presents for blood pressure management.  Today he notes he is doing well overall.  After the visit he is heading for 2-week trip to [facility].  He is without any acute complaints or concerns today.  He experiences difficulty maintaining stable blood pressure levels due to challenges with his current medication regimen. Previously, 10 mg of amlodipine  caused hypotension, leading to fatigue.   Attempts to reduce the dose to 7.5 mg were unsuccessful due to difficulties in splitting pills. Currently, he takes 10 mg of amlodipine  at night, with more stable blood pressure readings, though one instance of a reading below 100 mmHg occurred.  He also takes lisinopril  at night and metoprolol  in the morning. Home blood pressure readings on 5 mg of amlodipine  ranged from 149 to 157 mmHg, prompting a return to the 10 mg dose. He seeks a regimen that maintains blood pressure without causing hypotension.  He experiences occasional lightheadedness, especially in hot environments, attributed to work conditions.  He denies chest pains, dyspnea, orthopnea, PND, leg swelling, lightheadedness, dizziness, further syncope..   Review of systems:  Please see the history of present illness. All other systems are reviewed and otherwise negative.      Studies Reviewed:    EKG Interpretation Date/Time:  Friday February 11 2024 09:17:42 EDT Ventricular Rate:  64 PR Interval:  154 QRS Duration:  98 QT Interval:  420 QTC Calculation: 433 R Axis:   35  Text Interpretation: Sinus rhythm with frequent Premature ventricular complexes Confirmed by Rana Dixon [phone]) on 02/11/2024 12:41:04 PM    Echocardiogram 09/24/2018 1. The left ventricle has normal systolic function of 60-65%. The cavity  size is normal. There is no left ventricular wall thickness. Echo evidence  of impaired relaxation diastolic filling patterns.   2. Normal left atrial size.   3. Normal right atrial size.   4. Normal tricuspid valve.   5. The aortic valve tricuspid. There is mild thickening  and mild  calcification of the aortic valve.   6. The ascending aorta is normal is size and structure.   7. No atrial level shunt detected by color flow Doppler.   Cardiac catheterization 09/23/2018 The left ventricular systolic function is normal. LV end diastolic pressure is normal. The left ventricular ejection fraction is 55-65% by visual  estimate. There is no mitral valve regurgitation.   1. Coronary arteries are widely patent.  2. Normal LV systolic function 3. Normal filling pressures.  4. Onset of atrial fibrillation during his catheterization procedure Diagnostic Dominance: Right  Risk Assessment/Calculations:    CHA2DS2-VASc Score =     This indicates a  % annual risk of stroke. The patient's score is based upon:               Physical Exam:   VS:  BP (!) 112/50 (BP Location: Left Arm, Patient Position: Sitting, Cuff Size: Large)   Pulse 64   Ht 6' 1 (1.854 m)   Wt 244 lb (110.7 kg)   BMI 32.19 kg/m    Wt Readings from Last 3 Encounters:  02/11/24 244 lb (110.7 kg)  11/10/23 248 lb 12.8 oz (112.9 kg)  06/03/23 248 lb (112.5 kg)    GEN: Well nourished, well developed in no acute distress NECK: No JVD; No carotid bruits CARDIAC: RRR, no murmurs, rubs, gallops RESPIRATORY:  Clear to auscultation without rales, wheezing or rhonchi  ABDOMEN: Soft, non-tender, non-distended EXTREMITIES:  No edema; No acute deformity      Assessment and Plan:  Hypertension Blood pressure today is 112/50 He has had past history of hypotension and vasovagal syncope Experienced some hypotension on amlodipine  10 mg which was lowered to 5 mg, he then subsequently experienced hypertension with BP 150s.  He was to try 7.5 mg daily however was unable to adequately cut pills at home and he resumed 10 mg daily over the past month - He denies any recent hypotensive/hypertensive episodes over the past month - Today he will purchase pill cutter off of [employer].  He will begin amlodipine  5 mg in the p.m. and 2.5 mg in the a.m. for better blood pressure control - Continue lisinopril  20 mg daily and metoprolol  XL 25 mg daily  Paroxysmal atrial fibrillation Found to be in new onset atrial fibrillation at time of cardiac catheterization on 08/2018 and was subsequently placed on Eliquis  Eliquis  was then subsequently discontinued on 04/2023  after he had 2 years per ILR without reoccurrence - EKG today shows he is maintaining NSR - Today patient denies symptoms concerning for recurrent atrial fibrillation - Recent Paceart reviewed showing no new A-fib episodes  Vasovagal syncope S/p ILR in 06/2021 Today he denies any further syncopal episodes - Continue to clinically monitor  PVCs EKG today showing frequent PVCs - Today he remains euvolemic and well compensated on exam.  Unable to further titrate beta-blocker medication given history of hypotension.  He will need repeat EKG in 6 months - He does have a ILR in place.  Will reach out for Dr. Francyne for further assessment on his PVC burden.  Addendum 7/2: Reviewed ILR for PVC burden 6/25: PVC burden 11% 5/26: PVC burden 13.2% 4/20: PVC burden 12.5% 3/25: PVC burden 11.7% 2/9: PVC burden 14.2% 1/5: PVC burden 12.8%  - His average heart rate is low 60s high 50s with history of hypotension.  Do not feel comfortable starting beta-blocking therapy at this time.  Plan for EP referral for further evaluation of high PVC  burden with heart rate in low 60s - Plan for echocardiogram to reevaluate LV function     Dispo:  Return in about 6 months (around 08/12/2024).  Signed, Lum LITTIE Louis, NP

## 2024-02-11 NOTE — Patient Instructions (Signed)
 Medication Instructions:  NONE   Lab Work: NONE  Testing/Procedures: NONE  Follow-Up: At [facility], you and your health needs are our priority.  As part of our continuing mission to provide you with exceptional heart care, our providers are all part of one team.  This team includes your primary Cardiologist (physician) and Advanced Practice Providers or APPs (Physician Assistants and Nurse Practitioners) who all work together to provide you with the care you need, when you need it.  Your next appointment:   6 month(s)  Provider:   Hazle Lites, MD OR Palmer Bobo, DNP

## 2024-02-14 ENCOUNTER — Other Ambulatory Visit: Payer: Self-pay

## 2024-02-14 ENCOUNTER — Telehealth: Payer: Self-pay

## 2024-02-14 ENCOUNTER — Encounter: Payer: Self-pay | Admitting: Emergency Medicine

## 2024-02-14 MED ORDER — INSULIN LISPRO 100 UNIT/ML IJ SOLN: INTRAMUSCULAR | 0 refills | Status: DC

## 2024-02-14 NOTE — Telephone Encounter (Addendum)
 Prescription Request  02/14/2024  LOV:03/05/23  What is the name of the medication or equipment? HUMALOG  100 UNIT/ML injection [[id number]]   Have you contacted your pharmacy to request a refill? Yes   Which pharmacy would you like this sent to?  [id number] - [place], [place] - [address] AT CORNER OF [facility] [address] [place] [address] Phone: [phone] Fax: [phone]    Patient notified that their request is being sent to the clinical staff for review and that they should receive a response within 2 business days.   Please advise at [facility] [phone]  Prescription Request  02/14/2024  LOV: 03/05/23  What is the name of the medication or equipment? LANTUS  100 UNIT/ML injection [[id number]]   Have you contacted your pharmacy to request a refill? Yes   Which pharmacy would you like this sent to?  [id number] - [place], [place] - [address] AT CORNER OF [facility] [address] [place] [address] Phone: [phone] Fax: [phone]    Patient notified that their request is being sent to the clinical staff for review and that they should receive a response within 2 business days.   Please advise at [facility] [phone]

## 2024-02-16 ENCOUNTER — Other Ambulatory Visit: Payer: Self-pay | Admitting: Family Medicine

## 2024-02-16 ENCOUNTER — Telehealth: Payer: Self-pay | Admitting: Family Medicine

## 2024-02-16 ENCOUNTER — Other Ambulatory Visit: Payer: Self-pay

## 2024-02-16 DIAGNOSIS — E119 Type 2 diabetes mellitus without complications: Secondary | ICD-10-CM

## 2024-02-16 DIAGNOSIS — E7800 Pure hypercholesterolemia, unspecified: Secondary | ICD-10-CM

## 2024-02-16 MED ORDER — INSULIN GLARGINE 100 UNIT/ML ~~LOC~~ SOLN: SUBCUTANEOUS | 0 refills | Status: DC

## 2024-02-16 NOTE — Telephone Encounter (Signed)
 Sent in medication

## 2024-02-16 NOTE — Telephone Encounter (Signed)
 Prescription Request  02/16/2024  LOV: 03/05/2023  What is the name of the medication or equipment?   LANTUS  100 UNIT/ML injection  **90 day refill request**  Have you contacted your pharmacy to request a refill? Yes   Which pharmacy would you like this sent to?  [id number] - [place], [place] - [address] AT CORNER OF [facility] [address] [place] [address] Phone: [phone] Fax: [phone]    Patient notified that their request is being sent to the clinical staff for review and that they should receive a response within 2 business days.   Please advise pharmacist.

## 2024-02-17 ENCOUNTER — Ambulatory Visit (INDEPENDENT_AMBULATORY_CARE_PROVIDER_SITE_OTHER)

## 2024-02-17 DIAGNOSIS — R55 Syncope and collapse: Secondary | ICD-10-CM

## 2024-02-17 LAB — CUP PACEART REMOTE DEVICE CHECK
Date Time Interrogation Session: 20250625230845
Implantable Pulse Generator Implant Date: 20221031

## 2024-02-18 ENCOUNTER — Other Ambulatory Visit: Payer: Self-pay | Admitting: Family Medicine

## 2024-02-18 DIAGNOSIS — I10 Essential (primary) hypertension: Secondary | ICD-10-CM

## 2024-02-18 DIAGNOSIS — E785 Hyperlipidemia, unspecified: Secondary | ICD-10-CM

## 2024-02-18 DIAGNOSIS — E119 Type 2 diabetes mellitus without complications: Secondary | ICD-10-CM

## 2024-02-18 NOTE — Telephone Encounter (Signed)
 Requested medication (s) are due for refill today: yes  Requested medication (s) are on the active medication list: yes  Last refill:  12/24/23  Future visit scheduled: yes  Notes to clinic:  Unable to refill per protocol due to failed labs, no updated results.      Requested Prescriptions  Pending Prescriptions Disp Refills   OZEMPIC , 1 MG/DOSE, 4 MG/3ML SOPN [Pharmacy Med Name: OZEMPIC  4 MG/3 ML (1 MG/DOSE)]  1    Sig: INJECT 1 MG ONCE A WEEK AS DIRECTED     Endocrinology:  Diabetes - GLP-1 Receptor Agonists - semaglutide  Failed - 02/18/2024  8:52 AM      Failed - HBA1C in normal range and within 180 days    Hgb A1c MFr Bld  Date Value Ref Range Status  03/05/2023 7.6 (H) <5.7 % of total Hgb Final    Comment:    For someone without known diabetes, a hemoglobin A1c value of 6.5% or greater indicates that they may have  diabetes and this should be confirmed with a follow-up  test. . For someone with known diabetes, a value <7% indicates  that their diabetes is well controlled and a value  greater than or equal to 7% indicates suboptimal  control. A1c targets should be individualized based on  duration of diabetes, age, comorbid conditions, and  other considerations. . Currently, no consensus exists regarding use of hemoglobin A1c for diagnosis of diabetes for children. .          Failed - Cr in normal range and within 360 days    Creat  Date Value Ref Range Status  03/05/2023 1.54 (H) 0.70 - 1.28 mg/dL Final   Creatinine, Urine  Date Value Ref Range Status  03/05/2023 53 20 - 320 mg/dL Final         Failed - Valid encounter within last 6 months    Recent Outpatient Visits           11 months ago Diabetes mellitus, type II, insulin  dependent [facility])   [place] [facility] Duanne Butler DASEN, MD   2 years ago Diabetes mellitus, type II, insulin  dependent [facility])   [place] [facility] Pickard, Butler DASEN, MD

## 2024-02-21 ENCOUNTER — Ambulatory Visit: Payer: Self-pay | Admitting: Cardiovascular Disease

## 2024-02-23 ENCOUNTER — Other Ambulatory Visit: Payer: Self-pay | Admitting: *Deleted

## 2024-02-23 DIAGNOSIS — I10 Essential (primary) hypertension: Secondary | ICD-10-CM

## 2024-02-23 DIAGNOSIS — I480 Paroxysmal atrial fibrillation: Secondary | ICD-10-CM

## 2024-02-23 DIAGNOSIS — I471 Supraventricular tachycardia, unspecified: Secondary | ICD-10-CM

## 2024-02-23 DIAGNOSIS — I493 Ventricular premature depolarization: Secondary | ICD-10-CM

## 2024-02-23 NOTE — Addendum Note (Signed)
 Addended by: Omar Orrego L on: 02/23/2024 04:22 PM   Modules accepted: Orders

## 2024-02-29 ENCOUNTER — Other Ambulatory Visit (HOSPITAL_COMMUNITY): Payer: Self-pay

## 2024-02-29 ENCOUNTER — Encounter (HOSPITAL_BASED_OUTPATIENT_CLINIC_OR_DEPARTMENT_OTHER): Payer: Self-pay

## 2024-02-29 ENCOUNTER — Telehealth: Payer: Self-pay | Admitting: Pharmacy Technician

## 2024-02-29 NOTE — Telephone Encounter (Signed)
 Pharmacy Patient Advocate Encounter   Received notification from Onbase that prior authorization for Ozempic  (0.25 or 0.5 MG/DOSE) 2MG /3ML pen-injectors is due for renewal.   Insurance verification completed.   The patient is insured through [facility].  Action: Medication is now available without a prior authorization.

## 2024-03-01 ENCOUNTER — Ambulatory Visit (INDEPENDENT_AMBULATORY_CARE_PROVIDER_SITE_OTHER)

## 2024-03-01 DIAGNOSIS — I493 Ventricular premature depolarization: Secondary | ICD-10-CM | POA: Diagnosis not present

## 2024-03-01 DIAGNOSIS — I10 Essential (primary) hypertension: Secondary | ICD-10-CM

## 2024-03-01 LAB — ECHOCARDIOGRAM COMPLETE
Area-P 1/2: 3.17 cm2
S' Lateral: 2.6 cm

## 2024-03-03 ENCOUNTER — Other Ambulatory Visit: Payer: Self-pay | Admitting: Family Medicine

## 2024-03-03 ENCOUNTER — Other Ambulatory Visit: Payer: Self-pay

## 2024-03-03 DIAGNOSIS — G709 Myoneural disorder, unspecified: Secondary | ICD-10-CM

## 2024-03-03 MED ORDER — PREGABALIN 150 MG PO CAPS: ORAL_CAPSULE | ORAL | 3 refills | Status: AC

## 2024-03-05 ENCOUNTER — Ambulatory Visit: Payer: Self-pay | Admitting: Emergency Medicine

## 2024-03-06 ENCOUNTER — Encounter: Payer: Self-pay | Admitting: Family Medicine

## 2024-03-06 ENCOUNTER — Ambulatory Visit: Admitting: Family Medicine

## 2024-03-06 VITALS — BP 118/68 | HR 61 | Ht 73.0 in | Wt 242.2 lb

## 2024-03-06 DIAGNOSIS — Z794 Long term (current) use of insulin: Secondary | ICD-10-CM | POA: Diagnosis not present

## 2024-03-06 DIAGNOSIS — I10 Essential (primary) hypertension: Secondary | ICD-10-CM | POA: Diagnosis not present

## 2024-03-06 DIAGNOSIS — Z0000 Encounter for general adult medical examination without abnormal findings: Secondary | ICD-10-CM

## 2024-03-06 DIAGNOSIS — R5383 Other fatigue: Secondary | ICD-10-CM

## 2024-03-06 DIAGNOSIS — I480 Paroxysmal atrial fibrillation: Secondary | ICD-10-CM

## 2024-03-06 DIAGNOSIS — N400 Enlarged prostate without lower urinary tract symptoms: Secondary | ICD-10-CM | POA: Diagnosis not present

## 2024-03-06 DIAGNOSIS — E785 Hyperlipidemia, unspecified: Secondary | ICD-10-CM | POA: Diagnosis not present

## 2024-03-06 DIAGNOSIS — E119 Type 2 diabetes mellitus without complications: Secondary | ICD-10-CM | POA: Diagnosis not present

## 2024-03-06 DIAGNOSIS — Z1211 Encounter for screening for malignant neoplasm of colon: Secondary | ICD-10-CM

## 2024-03-06 DIAGNOSIS — Z23 Encounter for immunization: Secondary | ICD-10-CM

## 2024-03-06 MED ORDER — EMPAGLIFLOZIN 25 MG PO TABS: 25.0000 mg | ORAL_TABLET | Freq: Every day | ORAL | 0 refills | Status: DC

## 2024-03-06 MED ORDER — BD INSULIN SYRINGE U-100 1 ML MISC: 12 refills | Status: AC

## 2024-03-06 MED ORDER — EZETIMIBE 10 MG PO TABS: ORAL_TABLET | ORAL | 1 refills | Status: DC

## 2024-03-06 MED ORDER — OMEGA-3-ACID ETHYL ESTERS 1 G PO CAPS: 2.0000 | ORAL_CAPSULE | Freq: Two times a day (BID) | ORAL | 1 refills | Status: DC

## 2024-03-06 MED ORDER — METFORMIN HCL 500 MG PO TABS: 1000.0000 mg | ORAL_TABLET | Freq: Two times a day (BID) | ORAL | 0 refills | Status: DC

## 2024-03-06 MED ORDER — TIRZEPATIDE 7.5 MG/0.5ML ~~LOC~~ SOAJ: 7.5000 mg | SUBCUTANEOUS | 3 refills | Status: AC

## 2024-03-06 NOTE — Progress Notes (Signed)
 Carelink Summary Report / Loop Recorder

## 2024-03-06 NOTE — Progress Notes (Signed)
 Subjective:    Patient ID: Ronald Dorsey, male    DOB: [birth year], 73 y.o.   MRN: [id number] Patient is a very pleasant 73 year old Caucasian gentleman here today for follow-up of his diabetes and to complete physical exam.  Last documented colonoscopy was 2014.  This is overdue.  Also due for PSA today.  Patient reports fatigue.  He denies any chest pain or shortness of breath or dyspnea on exertion.  He would like to check his testosterone  level.  Patient is due for the shingles vaccine.  Pneumonia vaccine is up-to-date.  Patient has continuous blood glucose monitoring.  Patient is in range 66% of the time.  He is currently on 60 units of Lantus  daily with 10 units of rapid acting insulin  with meals along with Ozempic  1 mg subcu weekly Past Medical History:  Diagnosis Date   Apnea    Arthritis    Diabetes mellitus without complication (HCC)    Hypercholesterolemia    Hypertension    Neuromuscular disorder (HCC)    sciatica   Paroxysmal atrial fibrillation (HCC)    PSVT (paroxysmal supraventricular tachycardia) (HCC)    Past Surgical History:  Procedure Laterality Date   CARPAL TUNNEL RELEASE  2010   left wrist   EYE SURGERY     LEFT HEART CATH AND CORONARY ANGIOGRAPHY N/A 09/23/2018   Procedure: LEFT HEART CATH AND CORONARY ANGIOGRAPHY;  Surgeon: Verlin Lonni BIRCH, MD;  Location: [facility];  Service: Cardiovascular;  Laterality: N/A;   TONSILLECTOMY  1961   Current Outpatient Medications on File Prior to Visit  Medication Sig Dispense Refill   amLODipine  (NORVASC ) 5 MG tablet Take 1.5 tablets (7.5 mg total) by mouth daily. 135 tablet 1   aspirin  EC 81 MG tablet Take 81 mg by mouth daily. Swallow whole.     BD INSULIN  SYRINGE U/F 31G X 5/16 1 ML MISC USE AS DIRECTED TO INJECT INSULIN  5 TIMES PER DAY. DX E11.65 100 each 3   Continuous Blood Gluc Sensor (FREESTYLE LIBRE 14 DAY SENSOR) MISC Use as directed to monitor blood glucose continuously. Replace sensor Q14 days.  Dx: e11.9 2 each 11   ELIQUIS  5 MG TABS tablet Take 5 mg by mouth 2 (two) times daily.     ezetimibe  (ZETIA ) 10 MG tablet TAKE 1 TABLET BY MOUTH EVERY DAY 90 tablet 1   insulin  glargine (LANTUS ) 100 UNIT/ML injection INJECT 50 UNITS UNDER THE SKIN TWICE A DAY 90 mL 0   insulin  lispro (HUMALOG ) 100 UNIT/ML injection INJECT 0-30 UNITS INTO THE SKIN 3X DAILY WITH MEALS. PER SLIDING SCALE. MAX DAILY DOSE 90U 80 mL 0   Insulin  Syringes, Disposable, (B-D INSULIN  SYRINGE 1CC) U-100 1 ML MISC Use as directed to inject insulin  5x daily. Dx:E11.65. 500 each 12   JARDIANCE  25 MG TABS tablet TAKE 1 TABLET (25 MG TOTAL) BY MOUTH DAILY. 30 tablet 0   lisinopril  (ZESTRIL ) 20 MG tablet TAKE 1 TABLET BY MOUTH EVERY DAY 90 tablet 1   metFORMIN  (GLUCOPHAGE ) 500 MG tablet TAKE 2 TABLETS BY MOUTH TWICE A DAY 120 tablet 0   metoprolol  succinate (TOPROL -XL) 25 MG 24 hr tablet TAKE 1 TABLET BY MOUTH EVERY DAY 30 tablet 0   omega-3 acid ethyl esters (LOVAZA ) 1 g capsule TAKE 2 CAPSULES BY MOUTH TWICE A DAY 360 capsule 1   pioglitazone  (ACTOS ) 30 MG tablet TAKE 1 TABLET BY MOUTH EVERY DAY BEFORE BREAKFAST 90 tablet 1   pregabalin  (LYRICA ) 150 MG capsule TAKE 1  CAPSULE BY MOUTH TWICE A DAY 180 capsule 3   rosuvastatin  (CRESTOR ) 20 MG tablet TAKE 1 TABLET BY MOUTH EVERY DAY 90 tablet 1   Semaglutide , 1 MG/DOSE, (OZEMPIC , 1 MG/DOSE,) 4 MG/3ML SOPN INJECT 1 MG ONCE A WEEK AS DIRECTED 3 mL 1   Current Facility-Administered Medications on File Prior to Visit  Medication Dose Route Frequency Provider Last Rate Last Admin   lidocaine -EPINEPHrine  (XYLOCAINE -EPINEPHrine ) 1 %-1:200000 (PF) injection 10 mL  10 mL Infiltration Once Croitoru, Mihai, MD       Allergies  Allergen Reactions   Egg-Derived Products Nausea And Vomiting   Social History   Socioeconomic History   Marital status: Married    Spouse name: Not on file   Number of children: Not on file   Years of education: Not on file   Highest education level: 12th  grade  Occupational History   Not on file  Tobacco Use   Smoking status: Former   Smokeless tobacco: Former  Substance and Sexual Activity   Alcohol use: Yes    Comment: 1 glass of wine a month or less   Drug use: No   Sexual activity: Yes    Comment: married, works in Nurse, learning disability business  Other Topics Concern   Not on file  Social History Narrative   Not on file   Social Drivers of Health   Financial Resource Strain: Low Risk  (03/05/2024)   Overall Financial Resource Strain (CARDIA)    Difficulty of Paying Living Expenses: Not hard at all  Food Insecurity: No Food Insecurity (03/05/2024)   Hunger Vital Sign    Worried About Running Out of Food in the Last Year: Never true    Ran Out of Food in the Last Year: Never true  Transportation Needs: No Transportation Needs (03/05/2024)   PRAPARE - Administrator, Civil Service (Medical): No    Lack of Transportation (Non-Medical): No  Physical Activity: Inactive (03/05/2024)   Exercise Vital Sign    Days of Exercise per Week: 2 days    Minutes of Exercise per Session: 0 min  Stress: No Stress Concern Present (03/05/2024)   [employer] of Occupational Health - Occupational Stress Questionnaire    Feeling of Stress: Not at all  Social Connections: Moderately Isolated (03/05/2024)   Social Connection and Isolation Panel    Frequency of Communication with Friends and Family: More than three times a week    Frequency of Social Gatherings with Friends and Family: Twice a week    Attends Religious Services: Never    Database administrator or Organizations: No    Attends Engineer, structural: Not on file    Marital Status: Married  Catering manager Violence: Not At Risk (08/27/2022)   Humiliation, Afraid, Rape, and Kick questionnaire    Fear of Current or Ex-Partner: No    Emotionally Abused: No    Physically Abused: No    Sexually Abused: No   Family History  Problem Relation Age of Onset   Hypertension Father        Review of Systems  All other systems reviewed and are negative.      Objective:   Physical Exam Vitals reviewed.  Constitutional:      General: He is not in acute distress.    Appearance: He is well-developed. He is not diaphoretic.  HENT:     Head: Normocephalic and atraumatic.     Right Ear: External ear normal.     Left  Ear: External ear normal.     Nose: Nose normal.     Mouth/Throat:     Pharynx: No oropharyngeal exudate.  Eyes:     General: No scleral icterus.       Right eye: No discharge.        Left eye: No discharge.     Conjunctiva/sclera: Conjunctivae normal.     Pupils: Pupils are equal, round, and reactive to light.  Neck:     Thyroid : No thyromegaly.     Vascular: No JVD.     Trachea: No tracheal deviation.  Cardiovascular:     Rate and Rhythm: Normal rate. Rhythm irregular.     Heart sounds: Normal heart sounds. No murmur heard.    No friction rub. No gallop.  Pulmonary:     Effort: Pulmonary effort is normal. No respiratory distress.     Breath sounds: Normal breath sounds. No stridor. No wheezing or rales.  Chest:     Chest wall: No tenderness.  Abdominal:     General: Bowel sounds are normal. There is no distension.     Palpations: Abdomen is soft. There is no mass.     Tenderness: There is no abdominal tenderness. There is no guarding or rebound.  Musculoskeletal:        General: No tenderness or deformity. Normal range of motion.     Cervical back: Normal range of motion and neck supple.  Lymphadenopathy:     Cervical: No cervical adenopathy.  Skin:    General: Skin is warm.     Coloration: Skin is not pale.     Findings: No erythema or rash.  Neurological:     Mental Status: He is alert and oriented to person, place, and time.     Cranial Nerves: No cranial nerve deficit.     Motor: No abnormal muscle tone.     Coordination: Coordination normal.     Deep Tendon Reflexes: Reflexes are normal and symmetric.  Psychiatric:         Behavior: Behavior normal.        Thought Content: Thought content normal.        Judgment: Judgment normal.           Assessment & Plan:  Paroxysmal atrial fibrillation (HCC)  Hyperlipidemia, unspecified hyperlipidemia type - Plan: ezetimibe  (ZETIA ) 10 MG tablet, omega-3 acid ethyl esters (LOVAZA ) 1 g capsule  Essential hypertension  Diabetes mellitus, type II, insulin  dependent (HCC) - Plan: CBC with Differential/Platelet, Comprehensive metabolic panel with GFR, Lipid panel, Hemoglobin A1c, Microalbumin/Creatinine Ratio, Urine, Insulin  Syringes, Disposable, (B-D INSULIN  SYRINGE 1CC) U-100 1 ML MISC, ezetimibe  (ZETIA ) 10 MG tablet, empagliflozin  (JARDIANCE ) 25 MG TABS tablet, metFORMIN  (GLUCOPHAGE ) 500 MG tablet, omega-3 acid ethyl esters (LOVAZA ) 1 g capsule  General medical exam  Benign prostatic hyperplasia without lower urinary tract symptoms - Plan: PSA, Microalbumin/Creatinine Ratio, Urine  Colon cancer screening - Plan: Ambulatory referral to Gastroenterology  Fatigue, unspecified type - Plan: Testosterone  Total,Free,Bio, Males  Need for shingles vaccine - Plan: Varicella-zoster vaccine IM Due to fatigue, I will check a testosterone  level.  Blood pressure is elevated.  Recommended switching from Ozempic  to Mounjaro  to facilitate additional weight loss and better manage his sugars.  Transition to 7.5 mg daily and uptitrate on a monthly basis to the highest tolerated dose.  Patient received his shingles vaccine today.  Consult GI for colonoscopy.  Check a PSA to screen for prostate cancer.  The remainder of his preventative care is up-to-date.

## 2024-03-06 NOTE — Telephone Encounter (Signed)
 Duplicate request, refilled 03/03/24.  Requested Prescriptions  Pending Prescriptions Disp Refills   pregabalin  (LYRICA ) 150 MG capsule [Pharmacy Med Name: PREGABALIN  150 MG CAPSULE] 180 capsule     Sig: TAKE 1 CAPSULE BY MOUTH TWICE A DAY     Not Delegated - Neurology:  Anticonvulsants - Controlled - pregabalin  Failed - 03/06/2024  2:15 PM      Failed - This refill cannot be delegated      Failed - Cr in normal range and within 360 days    Creat  Date Value Ref Range Status  03/05/2023 1.54 (H) 0.70 - 1.28 mg/dL Final   Creatinine, Urine  Date Value Ref Range Status  03/05/2023 53 20 - 320 mg/dL Final         Passed - Completed PHQ-2 or PHQ-9 in the last 360 days      Passed - Valid encounter within last 12 months    Recent Outpatient Visits           Today Paroxysmal atrial fibrillation [facility])   [place] [facility] Duanne Butler DASEN, MD   1 year ago Diabetes mellitus, type II, insulin  dependent [facility])   [place] [facility] Duanne Butler DASEN, MD   2 years ago Diabetes mellitus, type II, insulin  dependent [facility])   [place] [facility] Pickard, Butler DASEN, MD

## 2024-03-07 ENCOUNTER — Ambulatory Visit: Payer: Self-pay | Admitting: Family Medicine

## 2024-03-07 ENCOUNTER — Telehealth: Payer: Self-pay

## 2024-03-07 ENCOUNTER — Telehealth: Payer: Self-pay | Admitting: Pharmacy Technician

## 2024-03-07 ENCOUNTER — Other Ambulatory Visit (HOSPITAL_COMMUNITY): Payer: Self-pay

## 2024-03-07 LAB — CBC WITH DIFFERENTIAL/PLATELET
Absolute Lymphocytes: 2133 {cells}/uL (ref 850–3900)
Absolute Monocytes: 738 {cells}/uL (ref 200–950)
Basophils Absolute: 62 {cells}/uL (ref 0–200)
Basophils Relative: 1 %
Eosinophils Absolute: 273 {cells}/uL (ref 15–500)
Eosinophils Relative: 4.4 %
HCT: 45.9 % (ref 38.5–50.0)
Hemoglobin: 15 g/dL (ref 13.2–17.1)
MCH: 30 pg (ref 27.0–33.0)
MCHC: 32.7 g/dL (ref 32.0–36.0)
MCV: 91.8 fL (ref 80.0–100.0)
MPV: 11.9 fL (ref 7.5–12.5)
Monocytes Relative: 11.9 %
Neutro Abs: 2995 {cells}/uL (ref 1500–7800)
Neutrophils Relative %: 48.3 %
Platelets: 153 Thousand/uL (ref 140–400)
RBC: 5 Million/uL (ref 4.20–5.80)
RDW: 14.5 % (ref 11.0–15.0)
Total Lymphocyte: 34.4 %
WBC: 6.2 Thousand/uL (ref 3.8–10.8)

## 2024-03-07 LAB — COMPREHENSIVE METABOLIC PANEL WITH GFR
AG Ratio: 2 (calc) (ref 1.0–2.5)
ALT: 41 U/L (ref 9–46)
AST: 34 U/L (ref 10–35)
Albumin: 4.5 g/dL (ref 3.6–5.1)
Alkaline phosphatase (APISO): 55 U/L (ref 35–144)
BUN: 21 mg/dL (ref 7–25)
CO2: 28 mmol/L (ref 20–32)
Calcium: 10.2 mg/dL (ref 8.6–10.3)
Chloride: 103 mmol/L (ref 98–110)
Creat: 1.01 mg/dL (ref 0.70–1.28)
Globulin: 2.3 g/dL (ref 1.9–3.7)
Glucose, Bld: 96 mg/dL (ref 65–99)
Potassium: 4.2 mmol/L (ref 3.5–5.3)
Sodium: 140 mmol/L (ref 135–146)
Total Bilirubin: 0.6 mg/dL (ref 0.2–1.2)
Total Protein: 6.8 g/dL (ref 6.1–8.1)
eGFR: 79 mL/min/1.73m2 (ref >=60)

## 2024-03-07 LAB — LIPID PANEL
Cholesterol: 94 mg/dL (ref <200)
HDL: 39 mg/dL — ABNORMAL LOW (ref >=40)
LDL Cholesterol (Calc): 36 mg/dL
Non-HDL Cholesterol (Calc): 55 mg/dL (ref <130)
Total CHOL/HDL Ratio: 2.4 (calc) (ref <5.0)
Triglycerides: 111 mg/dL (ref <150)

## 2024-03-07 LAB — MICROALBUMIN / CREATININE URINE RATIO
Creatinine, Urine: 80 mg/dL (ref 20–320)
Microalb Creat Ratio: 11 mg/g{creat} (ref <30)
Microalb, Ur: 0.9 mg/dL

## 2024-03-07 LAB — TESTOSTERONE TOTAL,FREE,BIO, MALES
Albumin: 4.5 g/dL (ref 3.6–5.1)
Sex Hormone Binding: 49 nmol/L (ref 22–77)
Testosterone, Bioavailable: 62.9 ng/dL (ref 15.0–150.0)
Testosterone, Free: 30.6 pg/mL (ref 6.0–73.0)
Testosterone: 334 ng/dL (ref 250–827)

## 2024-03-07 LAB — HEMOGLOBIN A1C
Hgb A1c MFr Bld: 7.6 % — ABNORMAL HIGH (ref <5.7)
Mean Plasma Glucose: 171 mg/dL
eAG (mmol/L): 9.5 mmol/L

## 2024-03-07 LAB — PSA: PSA: 2.72 ng/mL (ref <=4.00)

## 2024-03-07 NOTE — Telephone Encounter (Signed)
 Copied from CRM [id number]. Topic: Clinical - Medication Prior Auth >> Mar 07, 2024  1:03 PM Selinda RAMAN wrote: Reason for CRM: Jerel from [facility] called in stating she will be faxing over the approval for the patients Mounjaro . It will be starting today July 15th 2025 to March 07, 2025. If there are any other questions please call [phone] Opt 5. Please assist patient further.

## 2024-03-07 NOTE — Telephone Encounter (Signed)
 Chart notes faxed to [facility] for pt. On 03/07/2024. For  CGD. Fax # [phone]

## 2024-03-07 NOTE — Telephone Encounter (Signed)
 Pharmacy Patient Advocate Encounter   Received notification from Onbase that prior authorization for Mounjaro  7.5MG /0.5ML auto-injectors is required/requested.   Insurance verification completed.   The patient is insured through [facility] .   Per test claim: PA required; PA submitted to above mentioned insurance via CoverMyMeds Key/confirmation #/EOC [id number] Status is pending

## 2024-03-07 NOTE — Telephone Encounter (Signed)
 Pharmacy Patient Advocate Encounter  Received notification from [facility] that Prior Authorization for Mounjaro  7.5MG /0.5ML auto-injectors has been APPROVED from 03/07/24 to 03/07/25   PA #/Case ID/Reference #: [id number])

## 2024-03-08 ENCOUNTER — Ambulatory Visit (INDEPENDENT_AMBULATORY_CARE_PROVIDER_SITE_OTHER)

## 2024-03-08 VITALS — Ht 73.0 in | Wt 242.0 lb

## 2024-03-08 DIAGNOSIS — Z794 Long term (current) use of insulin: Secondary | ICD-10-CM

## 2024-03-08 DIAGNOSIS — E119 Type 2 diabetes mellitus without complications: Secondary | ICD-10-CM

## 2024-03-08 LAB — COLOR FUNDUS PHOTOGRAPHY - OU - BOTH EYES

## 2024-03-08 NOTE — Progress Notes (Signed)
 Ronald Dorsey arrived 03/08/2024 and has given verbal consent to obtain images and complete their overdue diabetic retinal screening.  The images have been sent to an ophthalmologist or optometrist for review and interpretation.  Results will be sent back to Duanne Butler DASEN, MD for review.  Patient has been informed they will be contacted when we receive the results via telephone or MyChart

## 2024-03-09 NOTE — Progress Notes (Signed)
 I have collaborated with the care management provider regarding care management and care coordination activities outlined in this encounter and have reviewed this encounter including documentation in the note and care plan. I am certifying that I agree with the content of this note and encounter as supervising physician.

## 2024-03-10 ENCOUNTER — Telehealth: Payer: Self-pay

## 2024-03-10 NOTE — Telephone Encounter (Signed)
 Fax received from [facility] requesting records from last OV for documentation of CGM. Notes faxed. Mjp,lpn

## 2024-03-13 NOTE — Addendum Note (Signed)
 Addended by: DUANNE LOWERS T on: 03/13/2024 04:44 PM   Modules accepted: Level of Service

## 2024-03-20 ENCOUNTER — Encounter

## 2024-04-03 ENCOUNTER — Telehealth: Payer: Self-pay

## 2024-04-03 NOTE — Telephone Encounter (Signed)
 Copied from CRM #[id number]. Topic: Clinical - Medication Question >> Apr 03, 2024  9:39 AM Emylou G wrote: Reason for CRM: Bernardino Canny w/Pharm [employer] [facility] Ozempic  and Mounjaro ?  He said patient should only be on one.. Which one can be cancelled?  Pls call him for questions: [phone] ( okay to leave msg if you need to )

## 2024-04-05 NOTE — Progress Notes (Signed)
 Electrophysiology Office Note:    Date:  04/06/2024   ID:  Ronald Dorsey, DOB 03/29/1951, MRN [id number]  [facility] Cardiologist:  Vinie JAYSON Maxcy, MD  [facility] Electrophysiologist:  OLE ONEIDA HOLTS, MD   Referring MD: Rana Lum CROME, NP   Chief Complaint: PVCs  History of Present Illness:    Ronald Dorsey is a 73 year old man who I am seeing today for an evaluation of frequent PVCs at the request of [name], NP.  The patient was last seen by [facility] on February 11, 2024.  He has a history of paroxysmal atrial fibrillation, prior syncopal episode in 2022 with a loop recorder in situ, hyperlipidemia, hypertension and type 2 diabetes.  At the appointment with [facility] he was reporting feeling well overall.  No recurrent syncope.  He was off Eliquis  given no recurrence on loop recorder monitoring.  All lites are the patient is with his wife today in clinic.  He is completely asymptomatic and has no awareness of his PVCs.  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  His wife describes an episode of syncope in the past that was accompanied by a prodrome while eating a heavy meal.     Their past medical, social and family history was reviewed.   ROS:   Please see the history of present illness.    All other systems reviewed and are negative.  EKGs/Labs/Other Studies Reviewed:    The following studies were reviewed today:  March 01, 2024 echo EF 55-60 Normal No significant valvular disease  February 17, 2024 Loop recorder interrogation reviewed PVC burden 11% No sustained atrial fibrillation  February 11, 2024 EKG reviewed.  PVCs.  Sinus rhythm.  PVCs have a left superior axis and are positive throughout the precordium.  EKG Interpretation Date/Time:  Thursday April 06 2024 08:00:52 EDT Ventricular Rate:  65 PR Interval:  182 QRS Duration:  100 QT Interval:  410 QTC Calculation: 426 R Axis:   77  Text Interpretation: Sinus rhythm with frequent Premature  ventricular complexes Confirmed by HOLTS OLE [phone]) on 04/06/2024 8:02:09 AM    Physical Exam:    VS:  BP (!) 116/58 (BP Location: Right Arm, Patient Position: Sitting, Cuff Size: Large)   Pulse 65   Ht 6' 1 (1.854 m)   Wt 249 lb 8 oz (113.2 kg)   SpO2 97%   BMI 32.92 kg/m     Wt Readings from Last 3 Encounters:  04/06/24 249 lb 8 oz (113.2 kg)  03/08/24 242 lb (109.8 kg)  03/06/24 242 lb 3.2 oz (109.9 kg)     GEN: no distress CARD: RRR, No MRG RESP: No IWOB. CTAB.        ASSESSMENT AND PLAN:    1. Paroxysmal atrial fibrillation (HCC)   2. Frequent PVCs      #PVCs Frequent.  Normal ejection fraction.  Minimally symptomatic.  I have recommended conservative therapy.  If symptoms progress in the future, could consider a trial of mexiletine or Ranexa/magnesium.  Catheter ablation could also be undertaken in the future if symptoms were to significantly progress.  For now, continue Toprol -XL.  #Syncope Does not sound arrhythmic.  Based on the description from his wife, appears to be related to hyper vagotonia.  I discussed the pathophysiology of vagally mediated syncope during today's clinic appointment.  I do not think any additional testing is needed.  #Paroxysmal atrial fibrillation No sustained recurrence on loop or monitoring Continue aspirin  81  Follow-up with  EP on an as-needed basis    Signed, Ole DASEN. Cindie, MD, [facility], [facility] 04/06/2024 8:15 AM    Electrophysiology [facility]

## 2024-04-06 ENCOUNTER — Ambulatory Visit: Attending: Cardiology | Admitting: Cardiology

## 2024-04-06 ENCOUNTER — Encounter: Payer: Self-pay | Admitting: Cardiology

## 2024-04-06 VITALS — BP 116/58 | HR 65 | Ht 73.0 in | Wt 249.5 lb

## 2024-04-06 DIAGNOSIS — I480 Paroxysmal atrial fibrillation: Secondary | ICD-10-CM | POA: Diagnosis not present

## 2024-04-06 DIAGNOSIS — I493 Ventricular premature depolarization: Secondary | ICD-10-CM | POA: Diagnosis not present

## 2024-04-06 NOTE — Patient Instructions (Signed)
 Medication Instructions:  Your physician recommends that you continue on your current medications as directed. Please refer to the Current Medication list given to you today.  *If you need a refill on your cardiac medications before your next appointment, please call your pharmacy*  Follow-Up: At [facility], you and your health needs are our priority.  As part of our continuing mission to provide you with exceptional heart care, our providers are all part of one team.  This team includes your primary Cardiologist (physician) and Advanced Practice Providers or APPs (Physician Assistants and Nurse Practitioners) who all work together to provide you with the care you need, when you need it.  Your next appointment:   As needed with Dr. Cindie

## 2024-04-14 DIAGNOSIS — E119 Type 2 diabetes mellitus without complications: Secondary | ICD-10-CM | POA: Diagnosis not present

## 2024-04-15 DIAGNOSIS — Z23 Encounter for immunization: Secondary | ICD-10-CM | POA: Diagnosis not present

## 2024-04-20 ENCOUNTER — Ambulatory Visit

## 2024-04-20 DIAGNOSIS — R55 Syncope and collapse: Secondary | ICD-10-CM | POA: Diagnosis not present

## 2024-04-20 LAB — CUP PACEART REMOTE DEVICE CHECK
Date Time Interrogation Session: 20250827231431
Implantable Pulse Generator Implant Date: 20221031

## 2024-04-24 ENCOUNTER — Ambulatory Visit: Payer: Self-pay | Admitting: Cardiovascular Disease

## 2024-04-30 ENCOUNTER — Telehealth: Payer: Self-pay | Admitting: Family Medicine

## 2024-04-30 DIAGNOSIS — E119 Type 2 diabetes mellitus without complications: Secondary | ICD-10-CM

## 2024-04-30 NOTE — Telephone Encounter (Signed)
 Prescription Request  04/30/2024  LOV: 03/06/2024  What is the name of the medication or equipment? empagliflozin  (JARDIANCE ) 25 MG TABS tablet   Have you contacted your pharmacy to request a refill? Yes   Which pharmacy would you like this sent to?  [id number] - [place], [place] - [address] AT CORNER OF [facility] [address] [place] [address] Phone: [phone] Fax: [phone]    Patient notified that their request is being sent to the clinical staff for review and that they should receive a response within 2 business days.   Please advise at [facility] [phone]

## 2024-05-02 ENCOUNTER — Other Ambulatory Visit: Payer: Self-pay

## 2024-05-02 DIAGNOSIS — Z0000 Encounter for general adult medical examination without abnormal findings: Secondary | ICD-10-CM

## 2024-05-02 DIAGNOSIS — Z794 Long term (current) use of insulin: Secondary | ICD-10-CM

## 2024-05-02 DIAGNOSIS — I10 Essential (primary) hypertension: Secondary | ICD-10-CM

## 2024-05-02 DIAGNOSIS — E785 Hyperlipidemia, unspecified: Secondary | ICD-10-CM

## 2024-05-02 DIAGNOSIS — I480 Paroxysmal atrial fibrillation: Secondary | ICD-10-CM

## 2024-05-02 MED ORDER — EMPAGLIFLOZIN 25 MG PO TABS: 25.0000 mg | ORAL_TABLET | Freq: Every day | ORAL | 1 refills | Status: AC

## 2024-05-02 MED ORDER — METOPROLOL SUCCINATE ER 25 MG PO TB24: ORAL_TABLET | ORAL | 1 refills | Status: AC

## 2024-05-02 NOTE — Telephone Encounter (Signed)
 Requested Prescriptions  Refused Prescriptions Disp Refills   empagliflozin  (JARDIANCE ) 25 MG TABS tablet 30 tablet 0    Sig: Take 1 tablet (25 mg total) by mouth daily.     Endocrinology:  Diabetes - SGLT2 Inhibitors Passed - 05/02/2024 12:29 PM      Passed - Cr in normal range and within 360 days    Creat  Date Value Ref Range Status  03/06/2024 1.01 0.70 - 1.28 mg/dL Final   Creatinine, Urine  Date Value Ref Range Status  03/06/2024 80 20 - 320 mg/dL Final         Passed - HBA1C is between 0 and 7.9 and within 180 days    Hgb A1c MFr Bld  Date Value Ref Range Status  03/06/2024 7.6 (H) <5.7 % Final    Comment:    For someone without known diabetes, a hemoglobin A1c value of 6.5% or greater indicates that they may have  diabetes and this should be confirmed with a follow-up  test. . For someone with known diabetes, a value <7% indicates  that their diabetes is well controlled and a value  greater than or equal to 7% indicates suboptimal  control. A1c targets should be individualized based on  duration of diabetes, age, comorbid conditions, and  other considerations. . Currently, no consensus exists regarding use of hemoglobin A1c for diagnosis of diabetes for children. .          Passed - eGFR in normal range and within 360 days    GFR, Est African American  Date Value Ref Range Status  11/25/2020 89 > OR = 60 mL/min/1.82m2 Final   GFR, Est Non African American  Date Value Ref Range Status  11/25/2020 76 > OR = 60 mL/min/1.76m2 Final   GFR, Estimated  Date Value Ref Range Status  04/06/2021 >60 >60 mL/min Final    Comment:    (NOTE) Calculated using the CKD-EPI Creatinine Equation (2021)    eGFR  Date Value Ref Range Status  03/06/2024 79 > OR = 60 mL/min/1.8m2 Final  01/19/2023 47 (L) >59 mL/min/1.73 Final         Passed - Valid encounter within last 6 months    Recent Outpatient Visits           1 month ago Paroxysmal atrial fibrillation [facility])    [place] [facility] Pickard, Butler DASEN, MD   1 year ago Diabetes mellitus, type II, insulin  dependent [facility])   [place] [facility] Duanne Butler DASEN, MD   2 years ago Diabetes mellitus, type II, insulin  dependent [facility])   [place] [facility] Pickard, Butler DASEN, MD

## 2024-05-02 NOTE — Progress Notes (Signed)
 Carelink Summary Report / Loop Recorder

## 2024-05-04 NOTE — Progress Notes (Signed)
 Remote Loop Recorder Transmission

## 2024-05-09 ENCOUNTER — Other Ambulatory Visit: Payer: Self-pay | Admitting: Family Medicine

## 2024-05-09 ENCOUNTER — Ambulatory Visit (INDEPENDENT_AMBULATORY_CARE_PROVIDER_SITE_OTHER)

## 2024-05-09 DIAGNOSIS — Z23 Encounter for immunization: Secondary | ICD-10-CM

## 2024-05-09 NOTE — Progress Notes (Signed)
 Patient is in office today for a nurse visit for Immunization. Patient Injection was given in the  Right deltoid. Patient tolerated injection well.

## 2024-05-10 NOTE — Telephone Encounter (Signed)
 Requested Prescriptions  Pending Prescriptions Disp Refills   insulin  glargine (LANTUS ) 100 UNIT/ML injection [Pharmacy Med Name: LANTUS  100 UNIT/ML VIAL] 90 mL 0    Sig: INJECT 50 UNITS SUBCUTANEOUSLY TWICE A DAY     Endocrinology:  Diabetes - Insulins Passed - 05/10/2024  3:28 PM      Passed - HBA1C is between 0 and 7.9 and within 180 days    Hgb A1c MFr Bld  Date Value Ref Range Status  03/06/2024 7.6 (H) <5.7 % Final    Comment:    For someone without known diabetes, a hemoglobin A1c value of 6.5% or greater indicates that they may have  diabetes and this should be confirmed with a follow-up  test. . For someone with known diabetes, a value <7% indicates  that their diabetes is well controlled and a value  greater than or equal to 7% indicates suboptimal  control. A1c targets should be individualized based on  duration of diabetes, age, comorbid conditions, and  other considerations. . Currently, no consensus exists regarding use of hemoglobin A1c for diagnosis of diabetes for children. SABRA Amy - Valid encounter within last 6 months    Recent Outpatient Visits           2 months ago Paroxysmal atrial fibrillation [facility])   [place] [facility] Pickard, Butler DASEN, MD   1 year ago Diabetes mellitus, type II, insulin  dependent [facility])   [place] [facility] Medicine Duanne Butler DASEN, MD   2 years ago Diabetes mellitus, type II, insulin  dependent [facility])   [place] [facility] Medicine Pickard, Butler DASEN, MD

## 2024-05-19 ENCOUNTER — Encounter: Payer: Self-pay | Admitting: Family Medicine

## 2024-05-22 ENCOUNTER — Ambulatory Visit (INDEPENDENT_AMBULATORY_CARE_PROVIDER_SITE_OTHER)

## 2024-05-22 DIAGNOSIS — I480 Paroxysmal atrial fibrillation: Secondary | ICD-10-CM | POA: Diagnosis not present

## 2024-05-22 LAB — CUP PACEART REMOTE DEVICE CHECK
Date Time Interrogation Session: 20250928231521
Implantable Pulse Generator Implant Date: 20221031

## 2024-05-23 ENCOUNTER — Ambulatory Visit: Payer: Self-pay | Admitting: Cardiovascular Disease

## 2024-05-24 NOTE — Progress Notes (Signed)
 Remote Loop Recorder Transmission

## 2024-05-25 ENCOUNTER — Other Ambulatory Visit: Payer: Self-pay | Admitting: Family Medicine

## 2024-06-01 ENCOUNTER — Other Ambulatory Visit: Payer: Self-pay | Admitting: Family Medicine

## 2024-06-01 DIAGNOSIS — E119 Type 2 diabetes mellitus without complications: Secondary | ICD-10-CM

## 2024-06-22 ENCOUNTER — Ambulatory Visit: Payer: Self-pay | Admitting: Cardiovascular Disease

## 2024-06-22 ENCOUNTER — Ambulatory Visit (INDEPENDENT_AMBULATORY_CARE_PROVIDER_SITE_OTHER)

## 2024-06-22 DIAGNOSIS — I480 Paroxysmal atrial fibrillation: Secondary | ICD-10-CM | POA: Diagnosis not present

## 2024-06-22 LAB — CUP PACEART REMOTE DEVICE CHECK
Date Time Interrogation Session: 20251029231042
Implantable Pulse Generator Implant Date: 20221031

## 2024-06-28 NOTE — Progress Notes (Signed)
 Remote Loop Recorder Transmission

## 2024-07-21 ENCOUNTER — Other Ambulatory Visit: Payer: Self-pay | Admitting: Internal Medicine

## 2024-07-21 DIAGNOSIS — I10 Essential (primary) hypertension: Secondary | ICD-10-CM

## 2024-07-23 ENCOUNTER — Ambulatory Visit: Attending: Cardiovascular Disease

## 2024-07-23 DIAGNOSIS — I480 Paroxysmal atrial fibrillation: Secondary | ICD-10-CM

## 2024-07-24 LAB — CUP PACEART REMOTE DEVICE CHECK
Date Time Interrogation Session: 20251129231440
Implantable Pulse Generator Implant Date: 20221031

## 2024-07-26 ENCOUNTER — Ambulatory Visit: Payer: Self-pay | Admitting: Cardiovascular Disease

## 2024-07-27 NOTE — Progress Notes (Signed)
 Remote Loop Recorder Transmission

## 2024-08-01 ENCOUNTER — Ambulatory Visit: Admitting: Family Medicine

## 2024-08-01 VITALS — BP 116/62 | HR 61 | Temp 97.5°F | Ht 73.0 in | Wt 248.2 lb

## 2024-08-01 DIAGNOSIS — S39012A Strain of muscle, fascia and tendon of lower back, initial encounter: Secondary | ICD-10-CM

## 2024-08-01 MED ORDER — TIZANIDINE HCL 4 MG PO TABS: 4.0000 mg | ORAL_TABLET | Freq: Four times a day (QID) | ORAL | 0 refills | Status: AC | PRN

## 2024-08-01 NOTE — Progress Notes (Signed)
 Subjective:    Patient ID: Ronald Dorsey, male    DOB: Aug 26, 1950, 73 y.o.   MRN: [id number]  Patient reports low back pain.  The pain is located over his left gluteus.  It hurts to stand up.  It hurts to sit for prolonged periods of time.  The pain was extremely bad Saturday night but after taking some ibuprofen the pain is better today.  He denies any nerve pain radiating down his leg.  He denies any numbness or weakness in his leg.  He denies any saddle anesthesia or bowel or bladder incontinence.  He denies any hematuria or dysuria or fevers or chills. Past Medical History:  Diagnosis Date   Apnea    Arthritis    Diabetes mellitus without complication (HCC)    Hypercholesterolemia    Hypertension    Neuromuscular disorder (HCC)    sciatica   Paroxysmal atrial fibrillation (HCC)    PSVT (paroxysmal supraventricular tachycardia)    Past Surgical History:  Procedure Laterality Date   CARPAL TUNNEL RELEASE  2010   left wrist   EYE SURGERY     LEFT HEART CATH AND CORONARY ANGIOGRAPHY N/A 09/23/2018   Procedure: LEFT HEART CATH AND CORONARY ANGIOGRAPHY;  Surgeon: Verlin Lonni BIRCH, MD;  [facility]: [facility];  Service: Cardiovascular;  Laterality: N/A;   TONSILLECTOMY  1961   Current Outpatient Medications on File Prior to Visit  Medication Sig Dispense Refill   amLODipine  (NORVASC ) 5 MG tablet TAKE 1.5 TABLETS BY MOUTH DAILY. 135 tablet 1   aspirin  EC 81 MG tablet Take 81 mg by mouth daily. Swallow whole.     BD INSULIN  SYRINGE U/F 31G X 5/16 1 ML MISC USE AS DIRECTED TO INJECT INSULIN  5 TIMES PER DAY. DX E11.65 100 each 3   Continuous Blood Gluc Sensor (FREESTYLE LIBRE 14 DAY SENSOR) MISC Use as directed to monitor blood glucose continuously. Replace sensor Q14 days. Dx: e11.9 2 each 11   empagliflozin  (JARDIANCE ) 25 MG TABS tablet Take 1 tablet (25 mg total) by mouth daily. 90 tablet 1   ezetimibe  (ZETIA ) 10 MG tablet TAKE 1 TABLET BY MOUTH EVERY DAY 90 tablet 1    insulin  glargine (LANTUS ) 100 UNIT/ML injection INJECT 50 UNITS SUBCUTANEOUSLY TWICE A DAY 90 mL 0   insulin  lispro (HUMALOG ) 100 UNIT/ML injection INJECT 0-30 UNITS INTO THE SKIN 3X DAILY WITH MEALS. PER SLIDING SCALE. MAX DAILY DOSE 90U 80 mL 0   Insulin  Syringes, Disposable, (B-D INSULIN  SYRINGE 1CC) U-100 1 ML MISC Use as directed to inject insulin  5x daily. Dx:E11.65. 500 each 12   lisinopril  (ZESTRIL ) 20 MG tablet TAKE 1 TABLET BY MOUTH EVERY DAY 90 tablet 1   metFORMIN  (GLUCOPHAGE ) 500 MG tablet TAKE 2 TABLETS BY MOUTH TWICE A DAY 360 tablet 0   metoprolol  succinate (TOPROL -XL) 25 MG 24 hr tablet TAKE 1 TABLET BY MOUTH EVERY DAY 90 tablet 1   omega-3 acid ethyl esters (LOVAZA ) 1 g capsule Take 2 capsules (2 g total) by mouth 2 (two) times daily. 360 capsule 1   pioglitazone  (ACTOS ) 30 MG tablet TAKE 1 TABLET BY MOUTH EVERY DAY BEFORE BREAKFAST 90 tablet 1   pregabalin  (LYRICA ) 150 MG capsule TAKE 1 CAPSULE BY MOUTH TWICE A DAY 180 capsule 3   rosuvastatin  (CRESTOR ) 20 MG tablet TAKE 1 TABLET BY MOUTH EVERY DAY 90 tablet 1   tirzepatide  (MOUNJARO ) 7.5 MG/0.5ML Pen Inject 7.5 mg into the skin once a week. 6 mL 3  Current Facility-Administered Medications on File Prior to Visit  Medication Dose Route Frequency Provider Last Rate Last Admin   lidocaine -EPINEPHrine  (XYLOCAINE -EPINEPHrine ) 1 %-1:200000 (PF) injection 10 mL  10 mL Infiltration Once Croitoru, Mihai, MD       Allergies  Allergen Reactions   Egg Protein-Containing Drug Products Nausea And Vomiting   Social History   Socioeconomic History   Marital status: Married    Spouse name: Not on file   Number of children: Not on file   Years of education: Not on file   Highest education level: 12th grade  Occupational History   Not on file  Tobacco Use   Smoking status: Former   Smokeless tobacco: Former  Substance and Sexual Activity   Alcohol use: Yes    Comment: 1 glass of wine a month or less   Drug use: No   Sexual  activity: Yes    Comment: married, works in nurse, learning disability business  Other Topics Concern   Not on file  Social History Narrative   Not on file   Social Drivers of Health   Financial Resource Strain: Low Risk  (08/01/2024)   Overall Financial Resource Strain (CARDIA)    Difficulty of Paying Living Expenses: Not hard at all  Food Insecurity: No Food Insecurity (08/01/2024)   Hunger Vital Sign    Worried About Running Out of Food in the Last Year: Never true    Ran Out of Food in the Last Year: Never true  Transportation Needs: No Transportation Needs (08/01/2024)   PRAPARE - Administrator, Civil Service (Medical): No    Lack of Transportation (Non-Medical): No  Physical Activity: Insufficiently Active (08/01/2024)   Exercise Vital Sign    Days of Exercise per Week: 4 days    Minutes of Exercise per Session: 10 min  Stress: No Stress Concern Present (08/01/2024)   [employer] of Occupational Health - Occupational Stress Questionnaire    Feeling of Stress: Not at all  Social Connections: Moderately Isolated (08/01/2024)   Social Connection and Isolation Panel    Frequency of Communication with Friends and Family: Three times a week    Frequency of Social Gatherings with Friends and Family: Twice a week    Attends Religious Services: Never    Database Administrator or Organizations: No    Attends Engineer, Structural: Not on file    Marital Status: Married  Catering Manager Violence: Not At Risk (08/27/2022)   Humiliation, Afraid, Rape, and Kick questionnaire    Fear of Current or Ex-Partner: No    Emotionally Abused: No    Physically Abused: No    Sexually Abused: No   Family History  Problem Relation Age of Onset   Hypertension Father       Review of Systems  All other systems reviewed and are negative.      Objective:   Physical Exam Vitals reviewed.  Constitutional:      General: He is not in acute distress.    Appearance: He is well-developed. He  is not diaphoretic.  HENT:     Head: Normocephalic and atraumatic.     Mouth/Throat:     Pharynx: No oropharyngeal exudate.  Eyes:     General: No scleral icterus.       Right eye: No discharge.        Left eye: No discharge.     Conjunctiva/sclera: Conjunctivae normal.     Pupils: Pupils are equal, round, and reactive to  light.  Neck:     Thyroid : No thyromegaly.     Vascular: No JVD.     Trachea: No tracheal deviation.  Cardiovascular:     Rate and Rhythm: Normal rate. Rhythm irregular.     Heart sounds: Normal heart sounds. No murmur heard.    No friction rub. No gallop.  Pulmonary:     Effort: Pulmonary effort is normal. No respiratory distress.     Breath sounds: Normal breath sounds. No stridor. No wheezing or rales.  Chest:     Chest wall: No tenderness.  Abdominal:     General: Bowel sounds are normal. There is no distension.     Palpations: Abdomen is soft. There is no mass.     Tenderness: There is no abdominal tenderness. There is no guarding or rebound.  Musculoskeletal:        General: Tenderness present.     Cervical back: Neck supple.     Lumbar back: Spasms and tenderness present. Decreased range of motion.       Back:  Skin:    General: Skin is warm.     Coloration: Skin is not pale.     Findings: No erythema or rash.  Neurological:     Mental Status: He is alert and oriented to person, place, and time.     Cranial Nerves: No cranial nerve deficit.     Motor: No abnormal muscle tone.     Coordination: Coordination normal.     Deep Tendon Reflexes: Reflexes are normal and symmetric.  Psychiatric:        Behavior: Behavior normal.        Thought Content: Thought content normal.        Judgment: Judgment normal.           Assessment & Plan:  Strain of lumbar region, initial encounter Patient has pulled a muscle in his lower back.  Recommended using Zanaflex  4 mg every 6 hours as needed.  He can supplement with ibuprofen as necessary.  Recommended  stretching exercise as well as heat.  Anticipate gradual improvement over the next 2 weeks.  The patient does have a large sebaceous cyst roughly around the level of T2 in the center of his back.  Offered the patient surgical excision if he chooses to.

## 2024-08-19 ENCOUNTER — Other Ambulatory Visit: Payer: Self-pay | Admitting: Family Medicine

## 2024-08-19 DIAGNOSIS — E119 Type 2 diabetes mellitus without complications: Secondary | ICD-10-CM

## 2024-08-19 DIAGNOSIS — E785 Hyperlipidemia, unspecified: Secondary | ICD-10-CM

## 2024-08-21 ENCOUNTER — Other Ambulatory Visit: Payer: Self-pay

## 2024-08-21 ENCOUNTER — Telehealth: Payer: Self-pay

## 2024-08-21 DIAGNOSIS — E119 Type 2 diabetes mellitus without complications: Secondary | ICD-10-CM

## 2024-08-21 DIAGNOSIS — I10 Essential (primary) hypertension: Secondary | ICD-10-CM

## 2024-08-21 DIAGNOSIS — E785 Hyperlipidemia, unspecified: Secondary | ICD-10-CM

## 2024-08-21 MED ORDER — PIOGLITAZONE HCL 30 MG PO TABS: ORAL_TABLET | ORAL | 1 refills | Status: AC

## 2024-08-21 MED ORDER — ROSUVASTATIN CALCIUM 20 MG PO TABS: 20.0000 mg | ORAL_TABLET | Freq: Every day | ORAL | 1 refills | Status: AC

## 2024-08-21 MED ORDER — LISINOPRIL 20 MG PO TABS: 20.0000 mg | ORAL_TABLET | Freq: Every day | ORAL | 1 refills | Status: AC

## 2024-08-21 NOTE — Telephone Encounter (Signed)
 Prescription Request  08/21/2024  LOV: 08/01/24  What is the name of the medication or equipment? pioglitazone  (ACTOS ) 30 MG tablet [[id number]]   Have you contacted your pharmacy to request a refill? Yes   Which pharmacy would you like this sent to?  [id number] [place], [place] - [address] AT [address] OF [facility] [address] [place] [address] Phone: [phone] Fax: [phone]    Patient notified that their request is being sent to the clinical staff for review and that they should receive a response within 2 business days.   Please advise at [facility] [phone]  Prescription Request  08/21/2024  LOV: 08/01/24  What is the name of the medication or equipment? lisinopril  (ZESTRIL ) 20 MG tablet [[id number]]   Have you contacted your pharmacy to request a refill? Yes   Which pharmacy would you like this sent to?  [id number] [place], [place] - [address] AT [address]NER OF [facility] [address] [place] [address] Phone: [phone] Fax: [phone]    Patient notified that their request is being sent to the clinical staff for review and that they should receive a response within 2 business days.   Please advise at [facility] [phone]  Prescription Request  08/21/2024  LOV: 08/01/24  What is the name of the medication or equipment? rosuvastatin  (CRESTOR ) 20 MG tablet [[id number]]   Have you contacted your pharmacy to request a refill? Yes   Which pharmacy would you like this sent to?  [id number] - [place], [place] - [address] AT [address]NER OF [facility] [address] [place] [address] Phone: [phone] Fax: [phone]    Patient notified that their request is being sent to the clinical staff for review and that they should receive a response within 2 business days.   Please advise at [facility] [phone]

## 2024-08-21 NOTE — Telephone Encounter (Signed)
 Sent in medication

## 2024-08-22 ENCOUNTER — Encounter: Payer: Self-pay | Admitting: Family Medicine

## 2024-08-22 ENCOUNTER — Ambulatory Visit (INDEPENDENT_AMBULATORY_CARE_PROVIDER_SITE_OTHER): Admitting: Family Medicine

## 2024-08-22 VITALS — BP 120/64 | HR 75 | Ht 73.0 in | Wt 248.0 lb

## 2024-08-22 DIAGNOSIS — J329 Chronic sinusitis, unspecified: Secondary | ICD-10-CM

## 2024-08-22 DIAGNOSIS — L723 Sebaceous cyst: Secondary | ICD-10-CM

## 2024-08-22 MED ORDER — CEFDINIR 300 MG PO CAPS: 300.0000 mg | ORAL_CAPSULE | Freq: Two times a day (BID) | ORAL | 0 refills | Status: AC

## 2024-08-22 NOTE — Progress Notes (Signed)
 "  Subjective:    Patient ID: Ronald Dorsey, male    DOB: 07-24-1951, 73 y.o.   MRN: [id number]  Patient has a large sebaceous cyst in the left center upper back roughly around the level of T3.  This is 4 cm in diameter.  There is a central pore.  He is here today requesting excision. Past Medical History:  Diagnosis Date   Apnea    Arthritis    Diabetes mellitus without complication (HCC)    Hypercholesterolemia    Hypertension    Neuromuscular disorder (HCC)    sciatica   Paroxysmal atrial fibrillation (HCC)    PSVT (paroxysmal supraventricular tachycardia)    Past Surgical History:  Procedure Laterality Date   CARPAL TUNNEL RELEASE  2010   left wrist   EYE SURGERY     LEFT HEART CATH AND CORONARY ANGIOGRAPHY N/A 09/23/2018   Procedure: LEFT HEART CATH AND CORONARY ANGIOGRAPHY;  Surgeon: Verlin Lonni BIRCH, MD;  Location: [facility];  Service: Cardiovascular;  Laterality: N/A;   TONSILLECTOMY  1961   Current Outpatient Medications on File Prior to Visit  Medication Sig Dispense Refill   amLODipine  (NORVASC ) 5 MG tablet TAKE 1.5 TABLETS BY MOUTH DAILY. 135 tablet 1   aspirin  EC 81 MG tablet Take 81 mg by mouth daily. Swallow whole.     BD INSULIN  SYRINGE U/F 31G X 5/16 1 ML MISC USE AS DIRECTED TO INJECT INSULIN  5 TIMES PER DAY. DX E11.65 100 each 3   Continuous Blood Gluc Sensor (FREESTYLE LIBRE 14 DAY SENSOR) MISC Use as directed to monitor blood glucose continuously. Replace sensor Q14 days. Dx: e11.9 2 each 11   empagliflozin  (JARDIANCE ) 25 MG TABS tablet Take 1 tablet (25 mg total) by mouth daily. 90 tablet 1   ezetimibe  (ZETIA ) 10 MG tablet TAKE 1 TABLET BY MOUTH EVERY DAY 90 tablet 1   insulin  glargine (LANTUS ) 100 UNIT/ML injection INJECT 50 UNITS SUBCUTANEOUSLY TWICE A DAY 90 mL 0   insulin  lispro (HUMALOG ) 100 UNIT/ML injection INJECT 0-30 UNITS INTO THE SKIN 3X DAILY WITH MEALS. PER SLIDING SCALE. MAX DAILY DOSE 90U 80 mL 0   Insulin  Syringes, Disposable,  (B-D INSULIN  SYRINGE 1CC) U-100 1 ML MISC Use as directed to inject insulin  5x daily. Dx:E11.65. 500 each 12   lisinopril  (ZESTRIL ) 20 MG tablet Take 1 tablet (20 mg total) by mouth daily. 90 tablet 1   metFORMIN  (GLUCOPHAGE ) 500 MG tablet TAKE 2 TABLETS BY MOUTH TWICE A DAY 360 tablet 0   metoprolol  succinate (TOPROL -XL) 25 MG 24 hr tablet TAKE 1 TABLET BY MOUTH EVERY DAY 90 tablet 1   omega-3 acid ethyl esters (LOVAZA ) 1 g capsule TAKE 2 CAPSULES BY MOUTH 2 TIMES DAILY. 360 capsule 1   pioglitazone  (ACTOS ) 30 MG tablet TAKE 1 TABLET BY MOUTH EVERY DAY BEFORE BREAKFAST 90 tablet 1   pregabalin  (LYRICA ) 150 MG capsule TAKE 1 CAPSULE BY MOUTH TWICE A DAY 180 capsule 3   rosuvastatin  (CRESTOR ) 20 MG tablet Take 1 tablet (20 mg total) by mouth daily. 90 tablet 1   tirzepatide  (MOUNJARO ) 7.5 MG/0.5ML Pen Inject 7.5 mg into the skin once a week. 6 mL 3   tiZANidine  (ZANAFLEX ) 4 MG tablet Take 1 tablet (4 mg total) by mouth every 6 (six) hours as needed for muscle spasms. 30 tablet 0   Current Facility-Administered Medications on File Prior to Visit  Medication Dose Route Frequency Provider Last Rate Last Admin   lidocaine -EPINEPHrine  (XYLOCAINE -EPINEPHrine ) 1 %-  1:200000 (PF) injection 10 mL  10 mL Infiltration Once Croitoru, Mihai, MD       Allergies  Allergen Reactions   Egg Protein-Containing Drug Products Nausea And Vomiting   Social History   Socioeconomic History   Marital status: Married    Spouse name: Not on file   Number of children: Not on file   Years of education: Not on file   Highest education level: 12th grade  Occupational History   Not on file  Tobacco Use   Smoking status: Former   Smokeless tobacco: Former  Substance and Sexual Activity   Alcohol use: Yes    Comment: 1 glass of wine a month or less   Drug use: No   Sexual activity: Yes    Comment: married, works in nurse, learning disability business  Other Topics Concern   Not on file  Social History Narrative   Not on file    Social Drivers of Health   Tobacco Use: Medium Risk (08/01/2024)   Patient History    Smoking Tobacco Use: Former    Smokeless Tobacco Use: Former    Passive Exposure: Not on Actuary Strain: Low Risk (08/01/2024)   Overall Financial Resource Strain (CARDIA)    Difficulty of Paying Living Expenses: Not hard at all  Food Insecurity: No Food Insecurity (08/01/2024)   Epic    Worried About Programme Researcher, Broadcasting/film/video in the Last Year: Never true    Ran Out of Food in the Last Year: Never true  Transportation Needs: No Transportation Needs (08/01/2024)   Epic    Lack of Transportation (Medical): No    Lack of Transportation (Non-Medical): No  Physical Activity: Insufficiently Active (08/01/2024)   Exercise Vital Sign    Days of Exercise per Week: 4 days    Minutes of Exercise per Session: 10 min  Stress: No Stress Concern Present (08/01/2024)   [employer] of Occupational Health - Occupational Stress Questionnaire    Feeling of Stress: Not at all  Social Connections: Moderately Isolated (08/01/2024)   Social Connection and Isolation Panel    Frequency of Communication with Friends and Family: Three times a week    Frequency of Social Gatherings with Friends and Family: Twice a week    Attends Religious Services: Never    Database Administrator or Organizations: No    Attends Engineer, Structural: Not on file    Marital Status: Married  Catering Manager Violence: Not At Risk (08/27/2022)   Humiliation, Afraid, Rape, and Kick questionnaire    Fear of Current or Ex-Partner: No    Emotionally Abused: No    Physically Abused: No    Sexually Abused: No  Depression (PHQ2-9): Low Risk (08/01/2024)   Depression (PHQ2-9)    PHQ-2 Score: 0  Alcohol Screen: Low Risk (03/05/2024)   Alcohol Screen    Last Alcohol Screening Score (AUDIT): 4  Housing: Unknown (08/01/2024)   Epic    Unable to Pay for Housing in the Last Year: No    Number of Times Moved in the Last Year: Not  on file    Homeless in the Last Year: No  Utilities: Not At Risk (08/27/2022)   AHC Utilities    Threatened with loss of utilities: No  Health Literacy: Not on file   Family History  Problem Relation Age of Onset   Hypertension Father       Review of Systems  All other systems reviewed and are negative.  Objective:   Physical Exam Vitals reviewed.  Constitutional:      General: He is not in acute distress.    Appearance: He is well-developed. He is not diaphoretic.  HENT:     Head: Normocephalic and atraumatic.     Mouth/Throat:     Pharynx: No oropharyngeal exudate.  Eyes:     General: No scleral icterus.       Right eye: No discharge.        Left eye: No discharge.     Conjunctiva/sclera: Conjunctivae normal.     Pupils: Pupils are equal, round, and reactive to light.  Neck:     Thyroid : No thyromegaly.     Vascular: No JVD.     Trachea: No tracheal deviation.  Cardiovascular:     Rate and Rhythm: Normal rate. Rhythm irregular.     Heart sounds: Normal heart sounds. No murmur heard.    No friction rub. No gallop.  Pulmonary:     Effort: Pulmonary effort is normal. No respiratory distress.     Breath sounds: Normal breath sounds. No stridor. No wheezing or rales.  Chest:     Chest wall: No tenderness.  Abdominal:     General: Bowel sounds are normal. There is no distension.     Palpations: Abdomen is soft. There is no mass.     Tenderness: There is no abdominal tenderness. There is no guarding or rebound.  Musculoskeletal:     Cervical back: Neck supple.       Back:  Skin:    General: Skin is warm.     Coloration: Skin is not pale.     Findings: No erythema or rash.  Neurological:     Mental Status: He is alert and oriented to person, place, and time.     Cranial Nerves: No cranial nerve deficit.     Motor: No abnormal muscle tone.     Coordination: Coordination normal.     Deep Tendon Reflexes: Reflexes are normal and symmetric.  Psychiatric:         Behavior: Behavior normal.        Thought Content: Thought content normal.        Judgment: Judgment normal.           Assessment & Plan:  Sebaceous cyst Skin was anesthetized with 0.1% lidocaine  with epinephrine .  He was then prepped and draped in sterile fashion with Betadine and a fenestrated drape.  I then used a scalpel to make a 3 cm x 5 cm ellipse in a vertical orientation around the central pore.  Using a pair of forceps, I grasped the skin and using traction I lifted the cyst sac gradually from the surrounding subcutaneous tissue while dissecting using the scalpel the cyst sac from the surrounding tissue.  The cyst was removed in its entirety.  Afterwards, there was a 4 cm x 3 cm opening approximately 1.25 inches deep.  I approximated the skin edges using 2, 3-0 Vicryl vertical mattress sutures.  There was significant tension still present.  I then used 2 horizontal 2-0 Ethilon horizontal mattress sutures to approximate the skin edges.  Once they were closed, I closed the skin edges with 6 simple interrupted 2-0 Ethilon sutures.  There was approximately 10 cc of blood loss.  Wound care was discussed.  Of note, the patient has been battling a sinus infection for 3 weeks.  He complains of daily headaches and head congestion.  I went ahead and started him on  Omnicef 300 mg twice daily for possible sinus infection "

## 2024-08-23 ENCOUNTER — Ambulatory Visit

## 2024-08-23 DIAGNOSIS — I480 Paroxysmal atrial fibrillation: Secondary | ICD-10-CM | POA: Diagnosis not present

## 2024-08-24 LAB — CUP PACEART REMOTE DEVICE CHECK
Date Time Interrogation Session: 20251230231049
Implantable Pulse Generator Implant Date: 20221031

## 2024-08-26 ENCOUNTER — Ambulatory Visit: Payer: Self-pay | Admitting: Cardiovascular Disease

## 2024-08-28 NOTE — Progress Notes (Signed)
 Remote Loop Recorder Transmission

## 2024-08-30 ENCOUNTER — Telehealth: Payer: Self-pay | Admitting: Family Medicine

## 2024-08-30 ENCOUNTER — Telehealth: Payer: Self-pay

## 2024-08-30 ENCOUNTER — Other Ambulatory Visit: Payer: Self-pay

## 2024-08-30 DIAGNOSIS — E119 Type 2 diabetes mellitus without complications: Secondary | ICD-10-CM

## 2024-08-30 MED ORDER — METFORMIN HCL 500 MG PO TABS: 1000.0000 mg | ORAL_TABLET | Freq: Two times a day (BID) | ORAL | 0 refills | Status: AC

## 2024-08-30 NOTE — Telephone Encounter (Signed)
 Prescription Request  08/30/2024  LOV: 08/22/2024  What is the name of the medication or equipment?   metFORMIN  (GLUCOPHAGE ) 500 MG tablet   Have you contacted your pharmacy to request a refill? Yes   Which pharmacy would you like this sent to?  [id number] - [place], [place] - [address] AT [address] OF [facility] [address]  [place] [address] Phone: [phone] Fax: [phone]    Patient notified that their request is being sent to the clinical staff for review and that they should receive a response within 2 business days.   Please advise pharmacist.

## 2024-08-30 NOTE — Telephone Encounter (Signed)
 Fax received from [facility] for 6 month records for CGM. Records faxed. Mjp,lpn

## 2024-08-30 NOTE — Telephone Encounter (Signed)
 Sent in medication

## 2024-08-31 ENCOUNTER — Ambulatory Visit: Admitting: Family Medicine

## 2024-08-31 VITALS — BP 120/56 | HR 56 | Ht 73.0 in | Wt 248.0 lb

## 2024-08-31 DIAGNOSIS — T8130XA Disruption of wound, unspecified, initial encounter: Secondary | ICD-10-CM

## 2024-08-31 NOTE — Progress Notes (Signed)
 "  Subjective:    Patient ID: Ronald Dorsey, male    DOB: [birth year], 74 y.o.   MRN: [id number] 08/22/24  Patient has a large sebaceous cyst in the left center upper back roughly around the level of T3.  This is 4 cm in diameter.  There is a central pore.  He is here today requesting excision.  At that time, my plan was: Skin was anesthetized with 0.1% lidocaine  with epinephrine .  He was then prepped and draped in sterile fashion with Betadine and a fenestrated drape.  I then used a scalpel to make a 3 cm x 5 cm ellipse in a vertical orientation around the central pore.  Using a pair of forceps, I grasped the skin and using traction I lifted the cyst sac gradually from the surrounding subcutaneous tissue while dissecting using the scalpel the cyst sac from the surrounding tissue.  The cyst was removed in its entirety.  Afterwards, there was a 4 cm x 3 cm opening approximately 1.25 inches deep.  I approximated the skin edges using 2, 3-0 Vicryl vertical mattress sutures.  There was significant tension still present.  I then used 2 horizontal 2-0 Ethilon horizontal mattress sutures to approximate the skin edges.  Once they were closed, I closed the skin edges with 6 simple interrupted 2-0 Ethilon sutures.  There was approximately 10 cc of blood loss.  Wound care was discussed.  Of note, the patient has been battling a sinus infection for 3 weeks.  He complains of daily headaches and head congestion.  I went ahead and started him on Omnicef  300 mg twice daily for possible sinus infection  08/31/24 Patient is here today for suture removal.  Unfortunately after I removed the 8 sutures holding the wound together, there was complete dehiscence of the wound.  There was no purulent drainage.  It simply opened.  There was an elliptical opening approximately 1.5 cm x 4 cm.  Approximately 5 cc of blood drained from the opening. Past Medical History:  Diagnosis Date   Apnea    Arthritis    Diabetes mellitus without  complication (HCC)    Hypercholesterolemia    Hypertension    Neuromuscular disorder (HCC)    sciatica   Paroxysmal atrial fibrillation (HCC)    PSVT (paroxysmal supraventricular tachycardia)    Past Surgical History:  Procedure Laterality Date   CARPAL TUNNEL RELEASE  2010   left wrist   EYE SURGERY     LEFT HEART CATH AND CORONARY ANGIOGRAPHY N/A 09/23/2018   Procedure: LEFT HEART CATH AND CORONARY ANGIOGRAPHY;  Surgeon: Verlin Lonni BIRCH, MD;  Location: [facility];  Service: Cardiovascular;  Laterality: N/A;   TONSILLECTOMY  1961   Current Outpatient Medications on File Prior to Visit  Medication Sig Dispense Refill   amLODipine  (NORVASC ) 5 MG tablet TAKE 1.5 TABLETS BY MOUTH DAILY. 135 tablet 1   aspirin  EC 81 MG tablet Take 81 mg by mouth daily. Swallow whole.     BD INSULIN  SYRINGE U/F 31G X 5/16 1 ML MISC USE AS DIRECTED TO INJECT INSULIN  5 TIMES PER DAY. DX E11.65 100 each 3   cefdinir  (OMNICEF ) 300 MG capsule Take 1 capsule (300 mg total) by mouth 2 (two) times daily. 20 capsule 0   Continuous Blood Gluc Sensor (FREESTYLE LIBRE 14 DAY SENSOR) MISC Use as directed to monitor blood glucose continuously. Replace sensor Q14 days. Dx: e11.9 2 each 11   empagliflozin  (JARDIANCE ) 25 MG TABS tablet Take 1  tablet (25 mg total) by mouth daily. 90 tablet 1   ezetimibe  (ZETIA ) 10 MG tablet TAKE 1 TABLET BY MOUTH EVERY DAY 90 tablet 1   insulin  glargine (LANTUS ) 100 UNIT/ML injection INJECT 50 UNITS SUBCUTANEOUSLY TWICE A DAY 90 mL 0   insulin  lispro (HUMALOG ) 100 UNIT/ML injection INJECT 0-30 UNITS INTO THE SKIN 3X DAILY WITH MEALS. PER SLIDING SCALE. MAX DAILY DOSE 90U 80 mL 0   Insulin  Syringes, Disposable, (B-D INSULIN  SYRINGE 1CC) U-100 1 ML MISC Use as directed to inject insulin  5x daily. Dx:E11.65. 500 each 12   lisinopril  (ZESTRIL ) 20 MG tablet Take 1 tablet (20 mg total) by mouth daily. 90 tablet 1   metFORMIN  (GLUCOPHAGE ) 500 MG tablet Take 2 tablets (1,000 mg total)  by mouth 2 (two) times daily. 360 tablet 0   metoprolol  succinate (TOPROL -XL) 25 MG 24 hr tablet TAKE 1 TABLET BY MOUTH EVERY DAY 90 tablet 1   omega-3 acid ethyl esters (LOVAZA ) 1 g capsule TAKE 2 CAPSULES BY MOUTH 2 TIMES DAILY. 360 capsule 1   pioglitazone  (ACTOS ) 30 MG tablet TAKE 1 TABLET BY MOUTH EVERY DAY BEFORE BREAKFAST 90 tablet 1   pregabalin  (LYRICA ) 150 MG capsule TAKE 1 CAPSULE BY MOUTH TWICE A DAY 180 capsule 3   rosuvastatin  (CRESTOR ) 20 MG tablet Take 1 tablet (20 mg total) by mouth daily. 90 tablet 1   tirzepatide  (MOUNJARO ) 7.5 MG/0.5ML Pen Inject 7.5 mg into the skin once a week. 6 mL 3   tiZANidine  (ZANAFLEX ) 4 MG tablet Take 1 tablet (4 mg total) by mouth every 6 (six) hours as needed for muscle spasms. 30 tablet 0   No current facility-administered medications on file prior to visit.   Allergies  Allergen Reactions   Egg Protein-Containing Drug Products Nausea And Vomiting   Social History   Socioeconomic History   Marital status: Married    Spouse name: Not on file   Number of children: Not on file   Years of education: Not on file   Highest education level: 12th grade  Occupational History   Not on file  Tobacco Use   Smoking status: Former   Smokeless tobacco: Former  Substance and Sexual Activity   Alcohol use: Yes    Comment: 1 glass of wine a month or less   Drug use: No   Sexual activity: Yes    Comment: married, works in nurse, learning disability business  Other Topics Concern   Not on file  Social History Narrative   Not on file   Social Drivers of Health   Tobacco Use: Medium Risk (08/22/2024)   Patient History    Smoking Tobacco Use: Former    Smokeless Tobacco Use: Former    Passive Exposure: Not on Actuary Strain: Low Risk (08/01/2024)   Overall Financial Resource Strain (CARDIA)    Difficulty of Paying Living Expenses: Not hard at all  Food Insecurity: No Food Insecurity (08/01/2024)   Epic    Worried About Programme Researcher, Broadcasting/film/video in the  Last Year: Never true    Ran Out of Food in the Last Year: Never true  Transportation Needs: No Transportation Needs (08/01/2024)   Epic    Lack of Transportation (Medical): No    Lack of Transportation (Non-Medical): No  Physical Activity: Insufficiently Active (08/01/2024)   Exercise Vital Sign    Days of Exercise per Week: 4 days    Minutes of Exercise per Session: 10 min  Stress: No Stress Concern Present (  08/01/2024)   [employer] - Occupational Stress Questionnaire    Feeling of Stress: Not at all  Social Connections: Moderately Isolated (08/01/2024)   Social Connection and Isolation Panel    Frequency of Communication with Friends and Family: Three times a week    Frequency of Social Gatherings with Friends and Family: Twice a week    Attends Religious Services: Never    Database Administrator or Organizations: No    Attends Engineer, Structural: Not on file    Marital Status: Married  Catering Manager Violence: Not At Risk (08/27/2022)   Humiliation, Afraid, Rape, and Kick questionnaire    Fear of Current or Ex-Partner: No    Emotionally Abused: No    Physically Abused: No    Sexually Abused: No  Depression (PHQ2-9): Low Risk (08/01/2024)   Depression (PHQ2-9)    PHQ-2 Score: 0  Alcohol Screen: Low Risk (03/05/2024)   Alcohol Screen    Last Alcohol Screening Score (AUDIT): 4  Housing: Unknown (08/01/2024)   Epic    Unable to Pay for Housing in the Last Year: No    Number of Times Moved in the Last Year: Not on file    Homeless in the Last Year: No  Utilities: Not At Risk (08/27/2022)   AHC Utilities    Threatened with loss of utilities: No  Health Literacy: Not on file   Family History  Problem Relation Age of Onset   Hypertension Father       Review of Systems  All other systems reviewed and are negative.      Objective:   Physical Exam Vitals reviewed.  Constitutional:      General: He is not in acute distress.     Appearance: He is well-developed. He is not diaphoretic.  HENT:     Head: Normocephalic and atraumatic.     Mouth/Throat:     Pharynx: No oropharyngeal exudate.  Eyes:     General: No scleral icterus.       Right eye: No discharge.        Left eye: No discharge.     Conjunctiva/sclera: Conjunctivae normal.     Pupils: Pupils are equal, round, and reactive to light.  Neck:     Thyroid : No thyromegaly.     Vascular: No JVD.     Trachea: No tracheal deviation.  Cardiovascular:     Rate and Rhythm: Normal rate. Rhythm irregular.     Heart sounds: Normal heart sounds. No murmur heard.    No friction rub. No gallop.  Pulmonary:     Effort: Pulmonary effort is normal. No respiratory distress.     Breath sounds: Normal breath sounds. No stridor. No wheezing or rales.  Chest:     Chest wall: No tenderness.  Abdominal:     General: Bowel sounds are normal. There is no distension.     Palpations: Abdomen is soft. There is no mass.     Tenderness: There is no abdominal tenderness. There is no guarding or rebound.  Musculoskeletal:     Cervical back: Neck supple.       Back:  Skin:    General: Skin is warm.     Coloration: Skin is not pale.     Findings: No erythema or rash.  Neurological:     Mental Status: He is alert and oriented to person, place, and time.     Cranial Nerves: No cranial nerve deficit.  Motor: No abnormal muscle tone.     Coordination: Coordination normal.     Deep Tendon Reflexes: Reflexes are normal and symmetric.  Psychiatric:        Behavior: Behavior normal.        Thought Content: Thought content normal.        Judgment: Judgment normal.           Assessment & Plan:  Wound dehiscence The skin was prepped and draped in sterile fashion.  The skin was anesthetized with 0.1% lidocaine  with epinephrine .  The skin edges were approximated with 5 simple interrupted 3-0 Ethilon sutures.  Return in 7 to 10 days for suture removal.  Monitor for any  secondary infection "

## 2024-09-05 ENCOUNTER — Ambulatory Visit: Admitting: Family Medicine

## 2024-09-11 ENCOUNTER — Ambulatory Visit: Admitting: Family Medicine

## 2024-09-12 ENCOUNTER — Encounter: Payer: Self-pay | Admitting: Family Medicine

## 2024-09-12 ENCOUNTER — Ambulatory Visit: Admitting: Family Medicine

## 2024-09-12 VITALS — BP 132/82 | HR 85 | Ht 73.0 in | Wt 248.0 lb

## 2024-09-12 DIAGNOSIS — L720 Epidermal cyst: Secondary | ICD-10-CM

## 2024-09-12 DIAGNOSIS — Z4802 Encounter for removal of sutures: Secondary | ICD-10-CM

## 2024-09-12 DIAGNOSIS — Z794 Long term (current) use of insulin: Secondary | ICD-10-CM

## 2024-09-12 DIAGNOSIS — E1165 Type 2 diabetes mellitus with hyperglycemia: Secondary | ICD-10-CM

## 2024-09-12 NOTE — Progress Notes (Unsigned)
 "  Patient Office Visit  Assessment & Plan:  Epidermal inclusion cyst  Encounter for removal of sutures  Type 2 diabetes mellitus with hyperglycemia, with long-term current use of insulin  (HCC)   Assessment and Plan    Postoperative care of epidermal inclusion cyst excision Sutures in place for 12 days, healing well with scabbing and scar tissue. No infection. Tetanus vaccination current. - Removed remaining sutures carefully. - Monitor for infection or wound dehiscence. - Use waterproof bandage during showers, regular band-Aid afterward.     Patient has upcoming appt with Dr. Duanne. Patient will have his wife evaluate it for any signs of infection.  Return if symptoms worsen or fail to improve.   Subjective:    Patient ID: Ronald Dorsey, male    DOB: Jul 27, 1951  Age: 74 y.o. MRN: [id number]  Chief Complaint  Patient presents with   Suture / Staple Removal    Suture removal on back.    HPI     History of Present Illness Ronald Dorsey is a 74 year old male with diabetes who presents for suture removal following cyst excision  Approximately 12 days ago, he underwent cyst removal. The cyst had been present for about twenty years and was not infected. His wife previously drained it, but this was discouraged due to the risk of infection, especially considering his diabetes. The initial suture removal was unsuccessful as the wound had not healed sufficiently, requiring restitching. The sutures have now been in place for about twelve days.  The cyst removal left a hole about the size of a thumb. Initially, nine sutures were placed along with three or four internal sutures. The first attempt at suture removal was unsuccessful, leading to a second round of sutures. There is a small scab present, and a waterproof bandage is used during showers, followed by a regular bandage.  He is diabetic, which was a consideration in the decision to remove the cyst to prevent potential  infection. His tetanus shot is up to date as of 2018.  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 He mentions walking four to five miles a day when at the beach, indicating a level of physical activity. He has a house at [facility] and travels there every other week.  Physical Exam SKIN: Sutures embedded with a small scab present. Scar tissue present around the suture site.  Results Suture removal External sutures removed from post-excisional wound on shoulder. One suture deeply embedded, partially adherent to scab and surrounding tissue. Small remnant of suture possibly remaining at inferior aspect. Wound with mild scar tissue, no dehiscence or active drainage.  Assessment and Plan Postoperative care of epidermal inclusion cyst excision Sutures in place for 12 days, healing well with scabbing and scar tissue. No infection. Tetanus vaccination current. - Removed remaining sutures carefully. - Monitor for infection or wound dehiscence. - Use waterproof bandage during showers, regular band-Aid afterward.    The ASCVD Risk score (Arnett DK, et al., 2019) failed to calculate for the following reasons:   The valid total cholesterol range is 130 to 320 mg/dL  Past Medical History:  Diagnosis Date   Apnea    Arthritis    Diabetes mellitus without complication (HCC)    Hypercholesterolemia    Hypertension    Neuromuscular disorder (HCC)    sciatica   Paroxysmal atrial fibrillation (HCC)    PSVT (paroxysmal supraventricular tachycardia)    Past Surgical History:  Procedure  Laterality Date   CARPAL TUNNEL RELEASE  2010   left wrist   EYE SURGERY     LEFT HEART CATH AND CORONARY ANGIOGRAPHY N/A 09/23/2018   Procedure: LEFT HEART CATH AND CORONARY ANGIOGRAPHY;  Surgeon: Verlin Lonni BIRCH, MD;  Location: [facility];  Service: Cardiovascular;  Laterality: N/A;   TONSILLECTOMY  1961   Social History[1] Family History   Problem Relation Age of Onset   Hypertension Father    Allergies[2]  ROS    Objective:    BP 132/82   Pulse 85   Ht 6' 1 (1.854 m)   Wt 248 lb (112.5 kg)   SpO2 99%   BMI 32.72 kg/m  BP Readings from Last 3 Encounters:  09/12/24 132/82  08/31/24 (!) 120/56  08/22/24 120/64   Wt Readings from Last 3 Encounters:  09/12/24 248 lb (112.5 kg)  08/31/24 248 lb (112.5 kg)  08/22/24 248 lb (112.5 kg)    Physical Exam Vitals and nursing note reviewed.  Constitutional:      General: He is not in acute distress.    Appearance: Normal appearance.  Skin:    Comments: Has sutures in place over mid back- sutures were embedded but removed with forceps/scissors with some difficulty. Incision is healing well with minimal erythema.   Neurological:     General: No focal deficit present.     Mental Status: He is alert and oriented to person, place, and time.  Psychiatric:        Mood and Affect: Mood normal.        Behavior: Behavior normal.      No results found for any visits on 09/12/24.           [1]  Social History Tobacco Use   Smoking status: Former   Smokeless tobacco: Former  Substance Use Topics   Alcohol use: Yes    Comment: 1 glass of wine a month or less   Drug use: No  [2]  Allergies Allergen Reactions   Egg Protein-Containing Drug Products Nausea And Vomiting   "

## 2024-09-13 ENCOUNTER — Encounter: Payer: Self-pay | Admitting: Family Medicine

## 2024-09-18 ENCOUNTER — Ambulatory Visit: Admitting: Family Medicine

## 2024-09-23 ENCOUNTER — Ambulatory Visit: Attending: Cardiovascular Disease

## 2024-09-23 DIAGNOSIS — R55 Syncope and collapse: Secondary | ICD-10-CM

## 2024-09-25 LAB — CUP PACEART REMOTE DEVICE CHECK
Date Time Interrogation Session: 20260130231019
Implantable Pulse Generator Implant Date: 20221031

## 2024-09-27 ENCOUNTER — Telehealth: Payer: Self-pay

## 2024-09-27 NOTE — Telephone Encounter (Signed)
 Fax received from [facility], requesting the patient's most recent chart notes from the last 6 months. Notes faxed to [phone]. Mjp,lpn

## 2024-09-28 ENCOUNTER — Ambulatory Visit: Payer: Self-pay | Admitting: Cardiovascular Disease

## 2024-09-29 NOTE — Progress Notes (Signed)
 Remote Loop Recorder Transmission

## 2024-10-03 ENCOUNTER — Ambulatory Visit: Admitting: Family Medicine

## 2024-10-17 ENCOUNTER — Ambulatory Visit: Admitting: Family Medicine

## 2024-10-24 ENCOUNTER — Ambulatory Visit

## 2024-11-24 ENCOUNTER — Ambulatory Visit

## 2024-12-25 ENCOUNTER — Ambulatory Visit

## 2025-01-25 ENCOUNTER — Ambulatory Visit

## 2025-02-25 ENCOUNTER — Ambulatory Visit

## 2025-03-13 ENCOUNTER — Encounter: Admitting: Family Medicine

## 2025-03-28 ENCOUNTER — Ambulatory Visit

## 2025-04-28 ENCOUNTER — Ambulatory Visit

## 2025-05-29 ENCOUNTER — Ambulatory Visit

## 2025-06-29 ENCOUNTER — Ambulatory Visit

## 2025-07-30 ENCOUNTER — Ambulatory Visit

## 2025-08-30 ENCOUNTER — Ambulatory Visit
